# Patient Record
Sex: Female | Born: 1935 | Race: White | Hispanic: No | Smoking: Never smoker
Health system: Southern US, Community
[De-identification: ages and names within clinical notes are randomized; demographics above are authoritative.]

## PROBLEM LIST (undated history)

## (undated) DIAGNOSIS — E7800 Pure hypercholesterolemia, unspecified: Secondary | ICD-10-CM

## (undated) DIAGNOSIS — M8580 Other specified disorders of bone density and structure, unspecified site: Secondary | ICD-10-CM

## (undated) DIAGNOSIS — I10 Essential (primary) hypertension: Secondary | ICD-10-CM

## (undated) DIAGNOSIS — F0390 Unspecified dementia without behavioral disturbance: Secondary | ICD-10-CM

## (undated) DIAGNOSIS — N189 Chronic kidney disease, unspecified: Secondary | ICD-10-CM

## (undated) DIAGNOSIS — F419 Anxiety disorder, unspecified: Secondary | ICD-10-CM

## (undated) DIAGNOSIS — R0600 Dyspnea, unspecified: Secondary | ICD-10-CM

## (undated) DIAGNOSIS — Z9989 Dependence on other enabling machines and devices: Secondary | ICD-10-CM

## (undated) HISTORY — PX: COLONOSCOPY: SHX174

## (undated) HISTORY — PX: EYE SURGERY: SHX253

## (undated) HISTORY — PX: ABDOMINAL HYSTERECTOMY: SHX81

---

## 1999-01-02 ENCOUNTER — Encounter: Payer: Self-pay | Admitting: Gynecology

## 1999-01-02 ENCOUNTER — Encounter: Admission: RE | Admit: 1999-01-02 | Discharge: 1999-01-02 | Payer: Self-pay | Admitting: Gynecology

## 1999-02-03 ENCOUNTER — Other Ambulatory Visit: Admission: RE | Admit: 1999-02-03 | Discharge: 1999-02-03 | Payer: Self-pay | Admitting: Gynecology

## 1999-12-02 ENCOUNTER — Ambulatory Visit (HOSPITAL_COMMUNITY): Admission: RE | Admit: 1999-12-02 | Discharge: 1999-12-02 | Payer: Self-pay | Admitting: Gastroenterology

## 2000-01-28 ENCOUNTER — Encounter: Payer: Self-pay | Admitting: Gynecology

## 2000-01-28 ENCOUNTER — Encounter: Admission: RE | Admit: 2000-01-28 | Discharge: 2000-01-28 | Payer: Self-pay | Admitting: Gynecology

## 2001-02-01 ENCOUNTER — Encounter: Payer: Self-pay | Admitting: Gynecology

## 2001-02-01 ENCOUNTER — Encounter: Admission: RE | Admit: 2001-02-01 | Discharge: 2001-02-01 | Payer: Self-pay | Admitting: Gynecology

## 2002-02-28 ENCOUNTER — Other Ambulatory Visit: Admission: RE | Admit: 2002-02-28 | Discharge: 2002-02-28 | Payer: Self-pay | Admitting: Gynecology

## 2003-03-04 ENCOUNTER — Emergency Department (HOSPITAL_COMMUNITY): Admission: EM | Admit: 2003-03-04 | Discharge: 2003-03-05 | Payer: Self-pay | Admitting: Emergency Medicine

## 2004-06-19 ENCOUNTER — Encounter: Admission: RE | Admit: 2004-06-19 | Discharge: 2004-06-19 | Payer: Self-pay | Admitting: Family Medicine

## 2005-07-31 ENCOUNTER — Encounter: Admission: RE | Admit: 2005-07-31 | Discharge: 2005-07-31 | Payer: Self-pay | Admitting: Family Medicine

## 2008-01-22 ENCOUNTER — Ambulatory Visit: Payer: Self-pay | Admitting: Diagnostic Radiology

## 2008-01-22 ENCOUNTER — Emergency Department (HOSPITAL_BASED_OUTPATIENT_CLINIC_OR_DEPARTMENT_OTHER): Admission: EM | Admit: 2008-01-22 | Discharge: 2008-01-22 | Payer: Self-pay | Admitting: Emergency Medicine

## 2010-06-06 NOTE — Procedures (Signed)
[facility]  Patient:    Julie Martinez, Julie Martinez                     MRN: [id number] Proc. Date: 12/02/99 Adm. Date:  [id number] Attending:  Orland Mustard CC:         Dellis Anes. Idell Pickles, M.D.   Procedure Report  PROCEDURE PERFORMED:  Colonoscopy.  ENDOSCOPIST:  Llana Aliment. Randa Evens, M.D.  MEDICATIONS USED:  Fentanyl 50 mcg, Versed 8 mg IV.  INSTRUMENT USED:  Pediatric video colonoscope.  INDICATIONS:  Abnormal CT with a narrowing in the sigmoid colon.  The patient has had left lower abdominal pain.  DESCRIPTION OF PROCEDURE:  The procedure had been explained to the patient and consent obtained.  With the patient in the left lateral decubitus position, the Olympus pediatric video colonoscope was inserted and advanced under direct visualization.  The prep was excellent and we were able to advance to the cecum without a great deal of difficulty.  The patient did have some diverticular disease of the sigmoid colon but it was not inflamed, swollen, narrowed or strictured.  We were able to pass this easily.  The ileocecal valve was identified as was the appendiceal orifice.  The scope was withdrawn and the cecum, ascending colon, hepatic flexure, transverse colon, splenic flexure, descending and sigmoid colon were seen well upon withdrawal.  No polyps or other lesions were seen.  Again moderate diverticular disease in the sigmoid colon without any evidence of diverticulitis, stricture or tumor.  No polyps were seen throughout the entire colon.  The rectum was also free of any significant lesions.  Scope withdrawn, patient tolerated the procedure well.  ASSESSMENT: 1. Moderate diverticular disease without any evidence of narrowing,    stricturing or active diverticulitis.  PLAN:  Will give the patient information about diverticulosis, start her on fiber supplements and see back in the office in four to six weeks. DD:  12/02/99 TD:  12/02/99 Job:  [id number]

## 2014-02-17 DIAGNOSIS — Z1231 Encounter for screening mammogram for malignant neoplasm of breast: Secondary | ICD-10-CM | POA: Diagnosis not present

## 2014-02-19 DIAGNOSIS — E559 Vitamin D deficiency, unspecified: Secondary | ICD-10-CM | POA: Diagnosis not present

## 2014-02-19 DIAGNOSIS — H578 Other specified disorders of eye and adnexa: Secondary | ICD-10-CM | POA: Diagnosis not present

## 2014-02-19 DIAGNOSIS — E782 Mixed hyperlipidemia: Secondary | ICD-10-CM | POA: Diagnosis not present

## 2014-02-19 DIAGNOSIS — L989 Disorder of the skin and subcutaneous tissue, unspecified: Secondary | ICD-10-CM | POA: Diagnosis not present

## 2014-02-19 DIAGNOSIS — F419 Anxiety disorder, unspecified: Secondary | ICD-10-CM | POA: Diagnosis not present

## 2014-02-19 DIAGNOSIS — I10 Essential (primary) hypertension: Secondary | ICD-10-CM | POA: Diagnosis not present

## 2014-02-19 DIAGNOSIS — R5383 Other fatigue: Secondary | ICD-10-CM | POA: Diagnosis not present

## 2014-02-19 DIAGNOSIS — Z79899 Other long term (current) drug therapy: Secondary | ICD-10-CM | POA: Diagnosis not present

## 2014-02-20 DIAGNOSIS — H1012 Acute atopic conjunctivitis, left eye: Secondary | ICD-10-CM | POA: Diagnosis not present

## 2014-02-27 DIAGNOSIS — H1012 Acute atopic conjunctivitis, left eye: Secondary | ICD-10-CM | POA: Diagnosis not present

## 2014-03-06 DIAGNOSIS — L309 Dermatitis, unspecified: Secondary | ICD-10-CM | POA: Diagnosis not present

## 2014-03-06 DIAGNOSIS — I781 Nevus, non-neoplastic: Secondary | ICD-10-CM | POA: Diagnosis not present

## 2014-03-06 DIAGNOSIS — X32XXXD Exposure to sunlight, subsequent encounter: Secondary | ICD-10-CM | POA: Diagnosis not present

## 2014-03-06 DIAGNOSIS — C44622 Squamous cell carcinoma of skin of right upper limb, including shoulder: Secondary | ICD-10-CM | POA: Diagnosis not present

## 2014-03-06 DIAGNOSIS — L570 Actinic keratosis: Secondary | ICD-10-CM | POA: Diagnosis not present

## 2014-03-06 DIAGNOSIS — L821 Other seborrheic keratosis: Secondary | ICD-10-CM | POA: Diagnosis not present

## 2014-04-03 DIAGNOSIS — D0439 Carcinoma in situ of skin of other parts of face: Secondary | ICD-10-CM | POA: Diagnosis not present

## 2014-04-03 DIAGNOSIS — Z85828 Personal history of other malignant neoplasm of skin: Secondary | ICD-10-CM | POA: Diagnosis not present

## 2014-04-03 DIAGNOSIS — L570 Actinic keratosis: Secondary | ICD-10-CM | POA: Diagnosis not present

## 2014-04-03 DIAGNOSIS — Z08 Encounter for follow-up examination after completed treatment for malignant neoplasm: Secondary | ICD-10-CM | POA: Diagnosis not present

## 2014-04-03 DIAGNOSIS — X32XXXD Exposure to sunlight, subsequent encounter: Secondary | ICD-10-CM | POA: Diagnosis not present

## 2014-05-01 DIAGNOSIS — X32XXXD Exposure to sunlight, subsequent encounter: Secondary | ICD-10-CM | POA: Diagnosis not present

## 2014-05-01 DIAGNOSIS — Z85828 Personal history of other malignant neoplasm of skin: Secondary | ICD-10-CM | POA: Diagnosis not present

## 2014-05-01 DIAGNOSIS — L570 Actinic keratosis: Secondary | ICD-10-CM | POA: Diagnosis not present

## 2014-05-01 DIAGNOSIS — Z08 Encounter for follow-up examination after completed treatment for malignant neoplasm: Secondary | ICD-10-CM | POA: Diagnosis not present

## 2014-07-30 DIAGNOSIS — R42 Dizziness and giddiness: Secondary | ICD-10-CM | POA: Diagnosis not present

## 2014-07-30 DIAGNOSIS — S61204A Unspecified open wound of right ring finger without damage to nail, initial encounter: Secondary | ICD-10-CM | POA: Diagnosis not present

## 2014-07-30 DIAGNOSIS — Z79899 Other long term (current) drug therapy: Secondary | ICD-10-CM | POA: Diagnosis not present

## 2014-07-30 DIAGNOSIS — Z85828 Personal history of other malignant neoplasm of skin: Secondary | ICD-10-CM | POA: Diagnosis not present

## 2014-07-30 DIAGNOSIS — E780 Pure hypercholesterolemia: Secondary | ICD-10-CM | POA: Diagnosis not present

## 2014-07-30 DIAGNOSIS — Z7982 Long term (current) use of aspirin: Secondary | ICD-10-CM | POA: Diagnosis not present

## 2014-07-30 DIAGNOSIS — S61214A Laceration without foreign body of right ring finger without damage to nail, initial encounter: Secondary | ICD-10-CM | POA: Diagnosis not present

## 2014-07-30 DIAGNOSIS — I10 Essential (primary) hypertension: Secondary | ICD-10-CM | POA: Diagnosis not present

## 2014-07-30 DIAGNOSIS — S0180XA Unspecified open wound of other part of head, initial encounter: Secondary | ICD-10-CM | POA: Diagnosis not present

## 2014-07-30 DIAGNOSIS — W0110XA Fall on same level from slipping, tripping and stumbling with subsequent striking against unspecified object, initial encounter: Secondary | ICD-10-CM | POA: Diagnosis not present

## 2014-11-12 ENCOUNTER — Encounter (HOSPITAL_BASED_OUTPATIENT_CLINIC_OR_DEPARTMENT_OTHER): Payer: Self-pay | Admitting: *Deleted

## 2014-11-12 ENCOUNTER — Emergency Department (HOSPITAL_BASED_OUTPATIENT_CLINIC_OR_DEPARTMENT_OTHER): Payer: Commercial Managed Care - HMO

## 2014-11-12 ENCOUNTER — Emergency Department (HOSPITAL_BASED_OUTPATIENT_CLINIC_OR_DEPARTMENT_OTHER)
Admission: EM | Admit: 2014-11-12 | Discharge: 2014-11-12 | Disposition: A | Discharge status: Home / Self Care | Payer: Commercial Managed Care - HMO | Attending: Emergency Medicine | Admitting: Emergency Medicine

## 2014-11-12 ENCOUNTER — Emergency Department (HOSPITAL_COMMUNITY): Payer: Commercial Managed Care - HMO

## 2014-11-12 DIAGNOSIS — E782 Mixed hyperlipidemia: Secondary | ICD-10-CM | POA: Diagnosis not present

## 2014-11-12 DIAGNOSIS — R413 Other amnesia: Secondary | ICD-10-CM | POA: Diagnosis not present

## 2014-11-12 DIAGNOSIS — M8580 Other specified disorders of bone density and structure, unspecified site: Secondary | ICD-10-CM | POA: Diagnosis not present

## 2014-11-12 DIAGNOSIS — R2681 Unsteadiness on feet: Secondary | ICD-10-CM | POA: Diagnosis not present

## 2014-11-12 DIAGNOSIS — I10 Essential (primary) hypertension: Secondary | ICD-10-CM | POA: Insufficient documentation

## 2014-11-12 DIAGNOSIS — R270 Ataxia, unspecified: Secondary | ICD-10-CM

## 2014-11-12 DIAGNOSIS — R42 Dizziness and giddiness: Secondary | ICD-10-CM

## 2014-11-12 DIAGNOSIS — E785 Hyperlipidemia, unspecified: Secondary | ICD-10-CM | POA: Diagnosis not present

## 2014-11-12 DIAGNOSIS — F4320 Adjustment disorder, unspecified: Secondary | ICD-10-CM | POA: Diagnosis not present

## 2014-11-12 DIAGNOSIS — E559 Vitamin D deficiency, unspecified: Secondary | ICD-10-CM | POA: Diagnosis not present

## 2014-11-12 HISTORY — DX: Essential (primary) hypertension: I10

## 2014-11-12 HISTORY — DX: Other specified disorders of bone density and structure, unspecified site: M85.80

## 2014-11-12 HISTORY — DX: Pure hypercholesterolemia, unspecified: E78.00

## 2014-11-12 LAB — BASIC METABOLIC PANEL
ANION GAP: 4 — AB (ref 5–15)
BUN: 22 mg/dL — ABNORMAL HIGH (ref 6–20)
CALCIUM: 9 mg/dL (ref 8.9–10.3)
CO2: 28 mmol/L (ref 22–32)
CREATININE: 0.75 mg/dL (ref 0.44–1.00)
Chloride: 106 mmol/L (ref 101–111)
GLUCOSE: 103 mg/dL — AB (ref 65–99)
Potassium: 3.9 mmol/L (ref 3.5–5.1)
Sodium: 138 mmol/L (ref 135–145)

## 2014-11-12 LAB — CBC WITH DIFFERENTIAL/PLATELET
BASOS ABS: 0 10*3/uL (ref 0.0–0.1)
BASOS PCT: 0 %
EOS PCT: 2 %
Eosinophils Absolute: 0.1 10*3/uL (ref 0.0–0.7)
HCT: 40.6 % (ref 36.0–46.0)
Hemoglobin: 13.3 g/dL (ref 12.0–15.0)
Lymphocytes Relative: 17 %
Lymphs Abs: 1.1 10*3/uL (ref 0.7–4.0)
MCH: 32.8 pg (ref 26.0–34.0)
MCHC: 32.8 g/dL (ref 30.0–36.0)
MCV: 100.2 fL — AB (ref 78.0–100.0)
MONO ABS: 0.6 10*3/uL (ref 0.1–1.0)
MONOS PCT: 9 %
Neutro Abs: 4.8 10*3/uL (ref 1.7–7.7)
Neutrophils Relative %: 72 %
PLATELETS: 215 10*3/uL (ref 150–400)
RBC: 4.05 MIL/uL (ref 3.87–5.11)
RDW: 12.4 % (ref 11.5–15.5)
WBC: 6.7 10*3/uL (ref 4.0–10.5)

## 2014-11-12 LAB — URINE MICROSCOPIC-ADD ON

## 2014-11-12 LAB — URINALYSIS, ROUTINE W REFLEX MICROSCOPIC
BILIRUBIN URINE: NEGATIVE
Glucose, UA: NEGATIVE mg/dL
HGB URINE DIPSTICK: NEGATIVE
KETONES UR: 15 mg/dL — AB
NITRITE: NEGATIVE
PH: 7 (ref 5.0–8.0)
Protein, ur: NEGATIVE mg/dL
SPECIFIC GRAVITY, URINE: 1.023 (ref 1.005–1.030)
Urobilinogen, UA: 1 mg/dL (ref 0.0–1.0)

## 2014-11-12 MED ORDER — MECLIZINE HCL 25 MG PO TABS
25.0000 mg | ORAL_TABLET | Freq: Once | ORAL | Status: AC
  Administered 2014-11-12: 25 mg via ORAL
  Filled 2014-11-12: qty 1

## 2014-11-12 NOTE — ED Provider Notes (Signed)
Pt is dizzy. Sent over from [facility] for MRI to r/o stroke. MRI is neg. I ambulated pt, and she had a guarded but stable gait with her own power. Offered Case Management and home PT and walker and bedside comod, but family refused, opting to take home their mother and staying with her all the times and seeing the pcp this week. Strict return precautions discussed.   Varney Biles, MD 11/12/14 2101

## 2014-11-12 NOTE — ED Provider Notes (Signed)
Medical screening examination/treatment/procedure(s) were conducted as a shared visit with non-physician practitioner(s) and myself.  I personally evaluated the patient during the encounter.  81 year old female with hypertension hyperlipidemia that presents to the emergency department with vertigo of unknown onset within the last 24 hours. Also with difficulty walking and running into the walls while consistent with leaning to the right per the family members. On my examination patient has no dysdiadochokinesia, dysmetria, motor or sensory abnormalities. However on ambulation patient is unable lift her left foot as well and this is apparently new. She also has a wide-based gait and walks slowly which is not her normal as well. Has had a little bit of increased urinary frequency so we'll check a urine here. Also is having vertigo so we will give her dose of meclizine to see if this helps. I'm concerned the patient possibly could have a central cause such as stroke so I discussed it with Dr. Doy Mince on the neurology team at [facility] who recommended transfer to the emergency department for an MRI and further vertigo workup as necessary and neurology consultation as appropriate.   Merrily Pew, MD 11/12/14 [phone]

## 2014-11-12 NOTE — ED Notes (Signed)
Patient transported to MRI 

## 2014-11-12 NOTE — ED Notes (Addendum)
Woke with dizziness. She was sent from [facility] for a CT of her head to r/o stroke.

## 2014-11-12 NOTE — ED Notes (Signed)
[facility] at bedside for transport. 

## 2014-11-12 NOTE — ED Notes (Signed)
MD and PA at bedside.  Pt currently ambulating with tech without difficulty.

## 2014-11-12 NOTE — ED Notes (Signed)
EDP asked to give pt and family update

## 2014-11-12 NOTE — ED Notes (Signed)
Dr. Kathrynn Humble ambulated pt in the hallway. Pt appeared steady on her feet. Junie Panning - RN aware of pt being ambulated.

## 2014-11-12 NOTE — Care Management (Signed)
ED CM met with patient and daughter in room to discuss recommendations for [facility] services, patient and daughter declined, stating they are having new floors laid, offered of OP services declined as well. Patient will have support at home 24 hours according to daughter Wells Guiles [phone]. Patient has rolling walker and bedside commode at home. Updated Dr. Dan Europe.and Industrial/product designer on [employer]. No  Further ED CM needs identified.

## 2014-11-12 NOTE — Discharge Instructions (Signed)
Ataxia °Ataxia is a condition that results in unsteadiness when walking and standing, poor coordination of body movements, and difficulty maintaining an upright posture. It occurs due to a problem with the part of your brain that controls coordination and stability (cerebellar dysfunction).  °CAUSES  °Ataxia can develop later in life (acquired ataxia) during your 20s to 30s, and even as late as into your 60s or beyond. Acquired ataxia may be caused by: °· Changes in your nervous system (neurodegenerative). °· Changes throughout your body (systemic disorders). °· Excess exposure to: °¨ Medicines, such as phenytoin and lithium. °¨ Solvents. °¨ Abuse of alcohol (alcoholism). °· Medical conditions, such as: °¨ Celiac sprue. °¨ Hypothyroidism. °¨ Vitamin E deficiency. °¨ Structural brain abnormalities, such as tumors. °¨ Multiple sclerosis. °¨ Stroke. °¨ Head injury. °Ataxia may also be present early in life (non-acquired ataxia). There are two main types of non-acquired ataxia: °· Cerebellar dysfunction present at birth (congenital). °· Family inheritance (genetic heredity). Friedreich ataxia is the most common form of hereditary ataxia. °SIGNS AND SYMPTOMS °The signs and symptoms of ataxia can vary depending on how severe the condition is that causes it. Signs and symptoms may include: °· Unsteadiness. °· Walking with a wide stance. °· Tremor. °· Poorly coordinated body movements. °· Difficulty maintaining a straight (upright) posture. °· Fatigue. °· Changes in your speech. °· Changes in your vision. °· Difficulty swallowing. °· Difficulty with writing. °· Decreased mental status (dementia). °· Muscle spasms. °DIAGNOSIS  °Ataxia is diagnosed by discussing your personal and family history and through a physical exam. You may also have additional tests such as: °· MRI. °· Genetic testing. °TREATMENT  °Treatment for ataxia may include treating or removing the underlying condition causing the ataxia. Surgery may be  required if a structural abnormality in your brain is causing the ataxia. Otherwise, supportive treatments may be used to manage your symptoms. °HOME CARE INSTRUCTIONS °Monitor your ataxia for any changes. The following actions may help any discomfort you are experiencing:  °· Do not drink alcohol. °· Lie down right away if you become very unsteady, dizzy, nauseated, or feel like you are going to faint. Wait until all of these feelings pass before you get up again. °SEEK IMMEDIATE MEDICAL CARE IF: °· Your unsteadiness suddenly worsens. °· You develop severe headaches, chest pain, or abdominal pain.   °· You have weakness or numbness on one side of your body.   °· You have problems with your vision.   °· You feel confused.   °· You have difficulty speaking.   °· You have an irregular heartbeat or a very fast pulse.   °  °This information is not intended to replace advice given to you by your health care provider. Make sure you discuss any questions you have with your health care provider. °  °Document Released: 08/02/2013 Document Reviewed: 08/02/2013 °Elsevier Interactive Patient Education ©2016 [employer] Inc. ° °

## 2014-11-12 NOTE — ED Notes (Signed)
[facility] transporting 

## 2014-11-12 NOTE — ED Notes (Signed)
Pt ambulatory to restroom standby assist with mild deviation to R, no assist needed for ambulation.

## 2014-11-12 NOTE — ED Notes (Signed)
Pt returned from MRI in no apparent distress

## 2014-11-12 NOTE — ED Provider Notes (Signed)
CSN: [id number]     Arrival date & time 11/12/14  1225 History   First MD Initiated Contact with Patient 11/12/14 1254     Chief Complaint  Patient presents with  . Dizziness    HPI   81 yo F PMH HTN, hyperlipidemia pw dizziness since she awoke this morning. She went to her PCP for evaluation of the dizziness, who told her to come to the ED for stroke workup. She has been concerned because her dizziness has been influencing her balance/gait. She describes the dizziness as constant, and "not feeling well." Her family members feel she has been more confused recently. She endorses urinary frequency and urgency. No room spinning sensation, fever, chills, CP, abdominal pain, N/V/D, recent change in medications or antibiotics.   Past Medical History  Diagnosis Date  . Hypertension   . Osteopenia   . High cholesterol    Past Surgical History  Procedure Laterality Date  . Abdominal hysterectomy    . Eye surgery     No family history on file. Social History  Substance Use Topics  . Smoking status: Never Smoker   . Smokeless tobacco: None  . Alcohol Use: No   OB History    No data available     Review of Systems  HENT: Negative for congestion, dental problem, drooling, ear discharge, ear pain, facial swelling, hearing loss, mouth sores, nosebleeds, postnasal drip, rhinorrhea, sinus pressure, sneezing, sore throat, tinnitus and trouble swallowing.   Eyes: Negative.   Respiratory: Negative.   Cardiovascular: Negative.   Gastrointestinal: Negative.   Genitourinary: Positive for urgency and frequency. Negative for dysuria, hematuria, flank pain, decreased urine volume, vaginal bleeding, vaginal discharge, enuresis, difficulty urinating, genital sores, vaginal pain, menstrual problem, pelvic pain and dyspareunia.  Musculoskeletal: Positive for gait problem. Negative for myalgias, back pain, joint swelling, arthralgias, neck pain and neck stiffness.  Skin: Negative.   Neurological:  Positive for dizziness and weakness. Negative for tremors, seizures, syncope, facial asymmetry, speech difficulty, light-headedness, numbness and headaches.  Hematological: Negative.   Psychiatric/Behavioral: Positive for confusion.       Family members state she is confused (occuring for at least 6 months).      Allergies  Review of patient's allergies indicates no known allergies.  Home Medications   Prior to Admission medications   Medication Sig Start Date End Date Taking? Authorizing Provider  alendronate (FOSAMAX) 70 MG tablet Take 70 mg by mouth once a week. Take with a full glass of water on an empty stomach.   Yes Historical Provider, MD  Aspirin (ASPIR-81 PO) Take by mouth.   Yes Historical Provider, MD  quinapril (ACCUPRIL) 20 MG tablet Take 20 mg by mouth at bedtime.   Yes Historical Provider, MD  SERTRALINE HCL PO Take by mouth.   Yes Historical Provider, MD  triamterene-hydrochlorothiazide (MAXZIDE-25) 37.5-25 MG tablet Take 1 tablet by mouth daily.   Yes Historical Provider, MD   BP 171/60 mmHg  Pulse 70  Temp(Src) 97.9 F (36.6 C) (Oral)  Resp 20  Ht 5\' 2"  (1.575 m)  Wt 113 lb (51.256 kg)  BMI 20.66 kg/m2  SpO2 97% Physical Exam  Constitutional: She is oriented to person, place, and time. She appears well-developed and well-nourished. No distress.  HENT:  Head: Normocephalic and atraumatic.  Left Ear: External ear normal.  Nose: Nose normal.  Mouth/Throat: Oropharynx is clear and moist. No oropharyngeal exudate.  Impacted cerumen.   Eyes: Conjunctivae are normal. Pupils are equal, round, and reactive to  light. Right eye exhibits no discharge. Left eye exhibits no discharge. No scleral icterus.  Ectropion bilaterally  Neck: No tracheal deviation present.  Cardiovascular: Normal rate, regular rhythm, normal heart sounds and intact distal pulses.  Exam reveals no gallop and no friction rub.   No murmur heard. Pulmonary/Chest: Effort normal and breath sounds  normal. No respiratory distress. She has no wheezes. She has no rales. She exhibits no tenderness.  Abdominal: Soft. Bowel sounds are normal. She exhibits no distension and no mass. There is no tenderness. There is no rebound and no guarding.  Musculoskeletal: Normal range of motion. She exhibits no edema.  Lymphadenopathy:    She has no cervical adenopathy.  Neurological: She is alert and oriented to person, place, and time. No cranial nerve deficit. She exhibits normal muscle tone. Coordination normal.  Wide-based stance and difficulty lifting left foot during gait examination. Family states this is new.   Skin: Skin is warm and dry. No rash noted. She is not diaphoretic. No erythema.  Psychiatric: She has a normal mood and affect. Her behavior is normal.  Nursing note and vitals reviewed.   ED Course  Procedures (including critical care time) Labs Review Labs Reviewed  CBC WITH DIFFERENTIAL/PLATELET - Abnormal; Notable for the following:    MCV 100.2 (*)    All other components within normal limits  BASIC METABOLIC PANEL - Abnormal; Notable for the following:    Glucose, Bld 103 (*)    BUN 22 (*)    Anion gap 4 (*)    All other components within normal limits  URINALYSIS, ROUTINE W REFLEX MICROSCOPIC (NOT AT [facility]) - Abnormal; Notable for the following:    Ketones, ur 15 (*)    Leukocytes, UA SMALL (*)    All other components within normal limits  URINE MICROSCOPIC-ADD ON - Abnormal; Notable for the following:    Bacteria, UA FEW (*)    All other components within normal limits    Imaging Review  CT Head Wo Contrast (Final result) Result time: 11/12/14 13:51:28  Final result by Rad Results In Interface (11/12/14 13:51:28)  Narrative:  CLINICAL DATA: Dizziness for 1 day  EXAM: CT HEAD WITHOUT CONTRAST  TECHNIQUE: Contiguous axial images were obtained from the base of the skull through the vertex without intravenous contrast.  COMPARISON: January 22, 2008  FINDINGS: There is age related volume loss. There is no intracranial mass, hemorrhage, extra-axial fluid collection, or midline shift. There is mild small vessel disease in the centra semiovale bilaterally. Elsewhere, gray-white compartments appear normal. No acute infarct is evident. Bony calvarium appears intact. The mastoid air cells are clear.  IMPRESSION: Age related volume loss with mild periventricular small vessel disease. No intracranial mass, hemorrhage, or acute appearing infarct.   Electronically Signed By: Lowella Grip III M.D. On: 11/12/2014 13:51  I have personally reviewed and evaluated these images and lab results as part of my medical decision-making.  MDM   Final diagnoses:  Vertigo  Ataxia   Patient afebrile with stable vitals. Will order CT head, EKG, CBC, BMP, UA.  Patient given meclizine.   Pt neurovascularly intact; however, when ambulating, demonstrates wide based stance and veers to the right. [facility] does not have a MRI scanner, so Dr. Dolly Rias spoke with Dr. Doy Mince and Dr. Vanita Panda who approved transfer to [facility] for further ataxia and vertigo workup.    Clare Lions, PA-C 11/17/14 1818  Merrily Pew, MD 11/17/14 [phone]

## 2014-11-15 DIAGNOSIS — F339 Major depressive disorder, recurrent, unspecified: Secondary | ICD-10-CM | POA: Diagnosis not present

## 2014-11-15 DIAGNOSIS — R42 Dizziness and giddiness: Secondary | ICD-10-CM | POA: Diagnosis not present

## 2014-11-15 DIAGNOSIS — Z23 Encounter for immunization: Secondary | ICD-10-CM | POA: Diagnosis not present

## 2015-01-04 DIAGNOSIS — F419 Anxiety disorder, unspecified: Secondary | ICD-10-CM | POA: Diagnosis not present

## 2015-01-04 DIAGNOSIS — I10 Essential (primary) hypertension: Secondary | ICD-10-CM | POA: Diagnosis not present

## 2015-01-04 DIAGNOSIS — E782 Mixed hyperlipidemia: Secondary | ICD-10-CM | POA: Diagnosis not present

## 2015-01-04 DIAGNOSIS — E559 Vitamin D deficiency, unspecified: Secondary | ICD-10-CM | POA: Diagnosis not present

## 2015-01-04 DIAGNOSIS — F339 Major depressive disorder, recurrent, unspecified: Secondary | ICD-10-CM | POA: Diagnosis not present

## 2015-01-04 DIAGNOSIS — R42 Dizziness and giddiness: Secondary | ICD-10-CM | POA: Diagnosis not present

## 2015-01-04 DIAGNOSIS — R413 Other amnesia: Secondary | ICD-10-CM | POA: Diagnosis not present

## 2015-01-20 HISTORY — PX: SHOULDER SURGERY: SHX246

## 2015-01-25 ENCOUNTER — Ambulatory Visit (INDEPENDENT_AMBULATORY_CARE_PROVIDER_SITE_OTHER): Payer: Commercial Managed Care - HMO | Admitting: Neurology

## 2015-01-25 ENCOUNTER — Encounter: Payer: Self-pay | Admitting: Neurology

## 2015-01-25 VITALS — BP 165/67 | HR 84 | Ht 62.0 in | Wt 111.6 lb

## 2015-01-25 DIAGNOSIS — E538 Deficiency of other specified B group vitamins: Secondary | ICD-10-CM | POA: Diagnosis not present

## 2015-01-25 DIAGNOSIS — F32A Depression, unspecified: Secondary | ICD-10-CM

## 2015-01-25 DIAGNOSIS — R413 Other amnesia: Secondary | ICD-10-CM

## 2015-01-25 DIAGNOSIS — F329 Major depressive disorder, single episode, unspecified: Secondary | ICD-10-CM | POA: Diagnosis not present

## 2015-01-25 MED ORDER — DONEPEZIL HCL 10 MG PO TABS: 10.0000 mg | ORAL_TABLET | Freq: Every day | ORAL | Status: DC

## 2015-01-25 NOTE — Progress Notes (Signed)
[facility]    Provider:  Dr Jaynee Eagles Referring Provider: Leighton Ruff, MD Primary Care Physician:  Gerrit Heck, MD  CC:  Memory problems  HPI:  NAZARENE Martinez is a 82 y.o. female here as a referral from Dr. Drema Dallas for memory problems. Past medical history of hypertension, anxiety and depression. Here with daughter who provides most information. Memory problems at least the last year. Has been worse since 07-16-2022 when her son passed away from [place]. Daughter noticed she forgot a funeral she went to of daughter's brother in law in November. Son was living his mother for many years since the late nighties. They noticed more memory problems since he died. Husband passed away in 1997/98. She has been depressed since her son died. She says his dog died recently (daughter says the dog died many years ago). Patient pays the bills and she has paid all of them. No missing bills, she owns the house, all the utilities are paid. Daughter reminds mother and takes her to all appointments. Daughter goes over every day. No accidents in the home. Patient is alone a lot and she is depressed. atient repeats herself a lot, that has been going on for the last year. She doesn't remember telling daughter many things. Brother with dementia.    Reviewed notes, labs and imaging from outside physicians, which showed:   Reviewed notes from [facility]. Patient has had problems with memory, the Mini-Mental Status exam done on 1024 had a score of 25 out of 30. Patient has also had a number of losses recently her son died of ALS, her dog died and recently a son-in-law die. Patient had forgotten that the son-in-law died. Patient has a history of hypertension, uncertain if she is actually taking. She was prescribed in September 90 day prescription. She has not been checking her blood pressures at home. Diagnosed with memory problems and essential hypertension, mixed hyperlipidemia, anxiety and  depression, dizziness. She was seen at the emergency room for dizziness and she was giving meclizine, the dizziness improved. 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 She is on sertraline and that dose was increased recently. She had reported she wasn't sleeping well. Notes noted that she had a sad mood and affect.  Personally reviewed images of the brain, MRI w/o 10/2013:  1. No acute intracranial abnormality. 2. 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  Review of Systems: Patient complains of symptoms per HPI as well as the following symptoms: Weight loss, blurred vision, easy bruising, cough, ringing in ears, cramps, rash, itching, runny nose, skin sensitivity, memory loss, dizziness, depression, decreased energy . Pertinent negatives per HPI. All others negative.   Social History   Social History  . Marital Status: Widowed    Spouse Name: N/A  . Number of Children: 3  . Years of Education: 1th   Occupational History  . n/a    Social History Main Topics  . Smoking status: Never Smoker   . Smokeless tobacco: Not on file  . Alcohol Use: No  . Drug Use: No  . Sexual Activity: Not on file   Other Topics Concern  . Not on file   Social History Narrative   Patient lives at home alone.   Caffeine Use: 1/2 cup of coffee daily    Family History  Problem Relation Age of Onset  . Heart failure  Mother     Past Medical History  Diagnosis Date  . Hypertension   . Osteopenia   . High cholesterol     Past Surgical History  Procedure Laterality Date  . Abdominal hysterectomy    . Eye surgery      Current Outpatient Prescriptions  Medication Sig Dispense Refill  . alendronate (FOSAMAX) 70 MG tablet Take 70 mg by mouth once a week. Take with a full glass of water on an empty stomach.    Marland Kitchen aspirin 81 MG EC tablet Take 81 mg by mouth  daily.    . quinapril (ACCUPRIL) 20 MG tablet Take 20 mg by mouth at bedtime.    . sertraline (ZOLOFT) 25 MG tablet Take 25 mg by mouth daily.    Marland Kitchen triamterene-hydrochlorothiazide (DYAZIDE) 37.5-25 MG capsule Take 1 capsule by mouth daily.  0   No current facility-administered medications for this visit.    Allergies as of 01/25/2015  . (No Known Allergies)    Vitals: BP 165/67 mmHg  Pulse 84  Ht 5\' 2"  (1.575 m)  Wt 111 lb 9.6 oz (50.621 kg)  BMI 20.41 kg/m2 Last Weight:  Wt Readings from Last 1 Encounters:  01/25/15 111 lb 9.6 oz (50.621 kg)   Last Height:   Ht Readings from Last 1 Encounters:  01/25/15 5\' 2"  (1.575 m)    Physical exam: Exam: Gen: NAD, conversant, well nourised, obese, well groomed                     CV: RRR, no MRG. No Carotid Bruits. No peripheral edema, warm, nontender Eyes: Conjunctivae clear without exudates or hemorrhage  Neuro: Detailed Neurologic Exam  Speech:    Speech is normal; fluent and spontaneous with normal comprehension.  Cognition:    The patient is oriented to person, place, and time;  Montreal Cognitive Assessment  01/25/2015  Visuospatial/ Executive (0/5) 3  Naming (0/3) 3  Attention: Read list of digits (0/2) 2  Attention: Read list of letters (0/1) 1  Attention: Serial 7 subtraction starting at 100 (0/3) 1  Language: Repeat phrase (0/2) 1  Language : Fluency (0/1) 0  Abstraction (0/2) 1  Delayed Recall (0/5) 1  Orientation (0/6) 5  Total 18    Cranial Nerves:    The pupils are equal, round, and reactive to light. The fundi are normal and spontaneous venous pulsations are present. Visual fields are full to finger confrontation. Extraocular movements are intact. Trigeminal sensation is intact and the muscles of mastication are normal. The face is symmetric. The palate elevates in the midline. Hearing intact. Voice is normal. Shoulder shrug is normal. The tongue has normal motion without fasciculations.   Coordination:     Normal finger to nose and heel to shin.   Gait:    No ataxia  Motor Observation:    No asymmetry, no atrophy, and no involuntary movements noted. Tone:    Normal muscle tone.    Posture:    Posture is normal. normal erect    Strength:    Strength is V/V in the upper and lower limbs.      Sensation: intact to LT     Reflex Exam:  DTR's:    Deep tendon reflexes in the upper and lower extremities are normal bilaterally.   Toes:    The toes are downgoing bilaterally.   Clonus:    Clonus is absent.      Assessment/Plan:  An 82 year old female here for cognitive complaints  with her daughter. Montral cognitive assessment score 18 out of 30 consistent with moderate memory dysfunction. However patient has been depressed lately due to the death of her son from [place] whom she cared for for many years and lip with her. Discussed with patient and daughter that depression can worsen cognitive abilities and I did highly encourage them to continue seeing primary care and also find a therapist that she could speak to. I'll check a few lab tests such as B12 and start Aricept. We'll follow the patient with serial memory testing. Discussed formal neurocognitive testing, they decline.   Sarina Ill, MD  [facility] [address] [place], [place] [address]  Phone [phone] Fax [phone]

## 2015-01-25 NOTE — Patient Instructions (Addendum)
Overall you are doing fairly well but I do want to suggest a few things today:   Remember to drink plenty of fluid, eat healthy meals and do not skip any meals. Try to eat protein with a every meal and eat a healthy snack such as fruit or nuts in between meals. Try to keep a regular sleep-wake schedule and try to exercise daily, particularly in the form of walking, 20-30 minutes a day, if you can.   As far as your medications are concerned, I would like to suggest: Aricept 1/2 pill then increase to a whole pill in one month.  As far as diagnostic testing: labwork  I would like to see you back in 6 months, sooner if we need to. Please call us with any interim questions, concerns, problems, updates or refill requests.   Our phone number is [phone]. We also have an after hours call service for urgent matters and there is a physician on-call for urgent questions. For any emergencies you know to call 911 or go to the nearest emergency room

## 2015-01-28 DIAGNOSIS — R413 Other amnesia: Secondary | ICD-10-CM | POA: Insufficient documentation

## 2015-01-28 DIAGNOSIS — F32A Depression, unspecified: Secondary | ICD-10-CM | POA: Insufficient documentation

## 2015-01-28 DIAGNOSIS — F329 Major depressive disorder, single episode, unspecified: Secondary | ICD-10-CM | POA: Insufficient documentation

## 2015-01-30 ENCOUNTER — Telehealth: Payer: Self-pay | Admitting: *Deleted

## 2015-01-30 NOTE — Telephone Encounter (Signed)
Tried mobile number. VM not set up. Tried calling home number. Spoke to pt about normal labs per Dr Jaynee Eagles. She verbalized understanding.

## 2015-01-30 NOTE — Telephone Encounter (Signed)
-----   Message from Melvenia Beam, MD sent at 01/30/2015  4:59 PM EST ----- Labs were normal thanks

## 2015-01-31 DIAGNOSIS — R198 Other specified symptoms and signs involving the digestive system and abdomen: Secondary | ICD-10-CM | POA: Diagnosis not present

## 2015-01-31 LAB — THYROID PANEL WITH TSH
Free Thyroxine Index: 2.3 (ref 1.2–4.9)
T3 Uptake Ratio: 25 % (ref 24–39)
T4 TOTAL: 9.2 ug/dL (ref 4.5–12.0)
TSH: 1.2 u[IU]/mL (ref 0.450–4.500)

## 2015-01-31 LAB — B12 AND FOLATE PANEL
Folate: 14.1 ng/mL (ref >3.0)
VITAMIN B 12: 315 pg/mL (ref 211–946)

## 2015-01-31 LAB — METHYLMALONIC ACID, SERUM: Methylmalonic Acid: 528 nmol/L — ABNORMAL HIGH (ref 0–378)

## 2015-02-04 ENCOUNTER — Other Ambulatory Visit: Payer: Self-pay | Admitting: Family Medicine

## 2015-02-04 DIAGNOSIS — R198 Other specified symptoms and signs involving the digestive system and abdomen: Secondary | ICD-10-CM

## 2015-02-11 ENCOUNTER — Ambulatory Visit
Admission: RE | Admit: 2015-02-11 | Discharge: 2015-02-11 | Disposition: A | Discharge status: Home / Self Care | Payer: Commercial Managed Care - HMO | Source: Ambulatory Visit | Attending: Family Medicine | Admitting: Family Medicine

## 2015-02-11 DIAGNOSIS — R1900 Intra-abdominal and pelvic swelling, mass and lump, unspecified site: Secondary | ICD-10-CM | POA: Diagnosis not present

## 2015-02-11 DIAGNOSIS — R198 Other specified symptoms and signs involving the digestive system and abdomen: Secondary | ICD-10-CM

## 2015-03-18 DIAGNOSIS — S42292A Other displaced fracture of upper end of left humerus, initial encounter for closed fracture: Secondary | ICD-10-CM | POA: Diagnosis not present

## 2015-03-18 DIAGNOSIS — R079 Chest pain, unspecified: Secondary | ICD-10-CM | POA: Diagnosis not present

## 2015-03-18 DIAGNOSIS — E785 Hyperlipidemia, unspecified: Secondary | ICD-10-CM | POA: Diagnosis not present

## 2015-03-18 DIAGNOSIS — S42352D Displaced comminuted fracture of shaft of humerus, left arm, subsequent encounter for fracture with routine healing: Secondary | ICD-10-CM | POA: Diagnosis not present

## 2015-03-18 DIAGNOSIS — S42202A Unspecified fracture of upper end of left humerus, initial encounter for closed fracture: Secondary | ICD-10-CM | POA: Diagnosis not present

## 2015-03-18 DIAGNOSIS — R51 Headache: Secondary | ICD-10-CM | POA: Diagnosis not present

## 2015-03-18 DIAGNOSIS — S42292S Other displaced fracture of upper end of left humerus, sequela: Secondary | ICD-10-CM | POA: Diagnosis not present

## 2015-03-18 DIAGNOSIS — M25512 Pain in left shoulder: Secondary | ICD-10-CM | POA: Diagnosis not present

## 2015-03-18 DIAGNOSIS — S46222A Laceration of muscle, fascia and tendon of other parts of biceps, left arm, initial encounter: Secondary | ICD-10-CM | POA: Diagnosis not present

## 2015-03-18 DIAGNOSIS — S42392A Other fracture of shaft of left humerus, initial encounter for closed fracture: Secondary | ICD-10-CM | POA: Diagnosis not present

## 2015-03-18 DIAGNOSIS — S42302A Unspecified fracture of shaft of humerus, left arm, initial encounter for closed fracture: Secondary | ICD-10-CM | POA: Diagnosis not present

## 2015-03-18 DIAGNOSIS — W1789XA Other fall from one level to another, initial encounter: Secondary | ICD-10-CM | POA: Diagnosis not present

## 2015-03-18 DIAGNOSIS — S0003XA Contusion of scalp, initial encounter: Secondary | ICD-10-CM | POA: Diagnosis not present

## 2015-03-18 DIAGNOSIS — S42352S Displaced comminuted fracture of shaft of humerus, left arm, sequela: Secondary | ICD-10-CM | POA: Diagnosis not present

## 2015-03-18 DIAGNOSIS — M6281 Muscle weakness (generalized): Secondary | ICD-10-CM | POA: Diagnosis not present

## 2015-03-18 DIAGNOSIS — Z9889 Other specified postprocedural states: Secondary | ICD-10-CM | POA: Diagnosis not present

## 2015-03-18 DIAGNOSIS — Z7982 Long term (current) use of aspirin: Secondary | ICD-10-CM | POA: Diagnosis not present

## 2015-03-18 DIAGNOSIS — G8918 Other acute postprocedural pain: Secondary | ICD-10-CM | POA: Diagnosis not present

## 2015-03-18 DIAGNOSIS — S42352A Displaced comminuted fracture of shaft of humerus, left arm, initial encounter for closed fracture: Secondary | ICD-10-CM | POA: Diagnosis not present

## 2015-03-18 DIAGNOSIS — S46112A Strain of muscle, fascia and tendon of long head of biceps, left arm, initial encounter: Secondary | ICD-10-CM | POA: Diagnosis not present

## 2015-03-18 DIAGNOSIS — D72829 Elevated white blood cell count, unspecified: Secondary | ICD-10-CM | POA: Diagnosis not present

## 2015-03-18 DIAGNOSIS — Z79899 Other long term (current) drug therapy: Secondary | ICD-10-CM | POA: Diagnosis not present

## 2015-03-18 DIAGNOSIS — I10 Essential (primary) hypertension: Secondary | ICD-10-CM | POA: Diagnosis not present

## 2015-03-18 DIAGNOSIS — Z85828 Personal history of other malignant neoplasm of skin: Secondary | ICD-10-CM | POA: Diagnosis not present

## 2015-03-18 DIAGNOSIS — S161XXA Strain of muscle, fascia and tendon at neck level, initial encounter: Secondary | ICD-10-CM | POA: Diagnosis not present

## 2015-03-22 DIAGNOSIS — M6281 Muscle weakness (generalized): Secondary | ICD-10-CM | POA: Diagnosis not present

## 2015-03-22 DIAGNOSIS — E785 Hyperlipidemia, unspecified: Secondary | ICD-10-CM | POA: Diagnosis not present

## 2015-03-22 DIAGNOSIS — S42352S Displaced comminuted fracture of shaft of humerus, left arm, sequela: Secondary | ICD-10-CM | POA: Diagnosis not present

## 2015-03-22 DIAGNOSIS — M25512 Pain in left shoulder: Secondary | ICD-10-CM | POA: Diagnosis not present

## 2015-03-22 DIAGNOSIS — S42292S Other displaced fracture of upper end of left humerus, sequela: Secondary | ICD-10-CM | POA: Diagnosis not present

## 2015-03-22 DIAGNOSIS — I10 Essential (primary) hypertension: Secondary | ICD-10-CM | POA: Diagnosis not present

## 2015-03-22 DIAGNOSIS — M79622 Pain in left upper arm: Secondary | ICD-10-CM | POA: Diagnosis not present

## 2015-03-22 DIAGNOSIS — S42302A Unspecified fracture of shaft of humerus, left arm, initial encounter for closed fracture: Secondary | ICD-10-CM | POA: Diagnosis not present

## 2015-03-22 DIAGNOSIS — S42352D Displaced comminuted fracture of shaft of humerus, left arm, subsequent encounter for fracture with routine healing: Secondary | ICD-10-CM | POA: Diagnosis not present

## 2015-03-25 DIAGNOSIS — E785 Hyperlipidemia, unspecified: Secondary | ICD-10-CM | POA: Diagnosis not present

## 2015-03-25 DIAGNOSIS — S42352D Displaced comminuted fracture of shaft of humerus, left arm, subsequent encounter for fracture with routine healing: Secondary | ICD-10-CM | POA: Diagnosis not present

## 2015-03-25 DIAGNOSIS — I10 Essential (primary) hypertension: Secondary | ICD-10-CM | POA: Diagnosis not present

## 2015-04-01 DIAGNOSIS — M79622 Pain in left upper arm: Secondary | ICD-10-CM | POA: Diagnosis not present

## 2015-04-08 DIAGNOSIS — E785 Hyperlipidemia, unspecified: Secondary | ICD-10-CM | POA: Diagnosis not present

## 2015-04-08 DIAGNOSIS — I10 Essential (primary) hypertension: Secondary | ICD-10-CM | POA: Diagnosis not present

## 2015-04-08 DIAGNOSIS — S42352D Displaced comminuted fracture of shaft of humerus, left arm, subsequent encounter for fracture with routine healing: Secondary | ICD-10-CM | POA: Diagnosis not present

## 2015-04-18 DIAGNOSIS — E7800 Pure hypercholesterolemia, unspecified: Secondary | ICD-10-CM | POA: Diagnosis not present

## 2015-04-18 DIAGNOSIS — Z9181 History of falling: Secondary | ICD-10-CM | POA: Diagnosis not present

## 2015-04-18 DIAGNOSIS — M80022D Age-related osteoporosis with current pathological fracture, left humerus, subsequent encounter for fracture with routine healing: Secondary | ICD-10-CM | POA: Diagnosis not present

## 2015-04-18 DIAGNOSIS — I10 Essential (primary) hypertension: Secondary | ICD-10-CM | POA: Diagnosis not present

## 2015-04-22 DIAGNOSIS — Z8781 Personal history of (healed) traumatic fracture: Secondary | ICD-10-CM | POA: Diagnosis not present

## 2015-04-22 DIAGNOSIS — Z967 Presence of other bone and tendon implants: Secondary | ICD-10-CM | POA: Diagnosis not present

## 2015-04-24 DIAGNOSIS — I10 Essential (primary) hypertension: Secondary | ICD-10-CM | POA: Diagnosis not present

## 2015-04-24 DIAGNOSIS — E7800 Pure hypercholesterolemia, unspecified: Secondary | ICD-10-CM | POA: Diagnosis not present

## 2015-04-24 DIAGNOSIS — M80022D Age-related osteoporosis with current pathological fracture, left humerus, subsequent encounter for fracture with routine healing: Secondary | ICD-10-CM | POA: Diagnosis not present

## 2015-04-24 DIAGNOSIS — Z9181 History of falling: Secondary | ICD-10-CM | POA: Diagnosis not present

## 2015-04-26 DIAGNOSIS — M80022D Age-related osteoporosis with current pathological fracture, left humerus, subsequent encounter for fracture with routine healing: Secondary | ICD-10-CM | POA: Diagnosis not present

## 2015-04-26 DIAGNOSIS — I10 Essential (primary) hypertension: Secondary | ICD-10-CM | POA: Diagnosis not present

## 2015-04-26 DIAGNOSIS — E7800 Pure hypercholesterolemia, unspecified: Secondary | ICD-10-CM | POA: Diagnosis not present

## 2015-04-26 DIAGNOSIS — Z9181 History of falling: Secondary | ICD-10-CM | POA: Diagnosis not present

## 2015-05-08 DIAGNOSIS — I10 Essential (primary) hypertension: Secondary | ICD-10-CM | POA: Diagnosis not present

## 2015-05-08 DIAGNOSIS — E7800 Pure hypercholesterolemia, unspecified: Secondary | ICD-10-CM | POA: Diagnosis not present

## 2015-05-08 DIAGNOSIS — Z9181 History of falling: Secondary | ICD-10-CM | POA: Diagnosis not present

## 2015-05-08 DIAGNOSIS — M80022D Age-related osteoporosis with current pathological fracture, left humerus, subsequent encounter for fracture with routine healing: Secondary | ICD-10-CM | POA: Diagnosis not present

## 2015-05-10 DIAGNOSIS — I10 Essential (primary) hypertension: Secondary | ICD-10-CM | POA: Diagnosis not present

## 2015-05-10 DIAGNOSIS — M80022D Age-related osteoporosis with current pathological fracture, left humerus, subsequent encounter for fracture with routine healing: Secondary | ICD-10-CM | POA: Diagnosis not present

## 2015-05-10 DIAGNOSIS — Z9181 History of falling: Secondary | ICD-10-CM | POA: Diagnosis not present

## 2015-05-10 DIAGNOSIS — E7800 Pure hypercholesterolemia, unspecified: Secondary | ICD-10-CM | POA: Diagnosis not present

## 2015-05-13 DIAGNOSIS — M80022D Age-related osteoporosis with current pathological fracture, left humerus, subsequent encounter for fracture with routine healing: Secondary | ICD-10-CM | POA: Diagnosis not present

## 2015-05-13 DIAGNOSIS — I10 Essential (primary) hypertension: Secondary | ICD-10-CM | POA: Diagnosis not present

## 2015-05-13 DIAGNOSIS — E7800 Pure hypercholesterolemia, unspecified: Secondary | ICD-10-CM | POA: Diagnosis not present

## 2015-05-13 DIAGNOSIS — Z9181 History of falling: Secondary | ICD-10-CM | POA: Diagnosis not present

## 2015-05-15 DIAGNOSIS — M80022D Age-related osteoporosis with current pathological fracture, left humerus, subsequent encounter for fracture with routine healing: Secondary | ICD-10-CM | POA: Diagnosis not present

## 2015-05-15 DIAGNOSIS — I10 Essential (primary) hypertension: Secondary | ICD-10-CM | POA: Diagnosis not present

## 2015-05-15 DIAGNOSIS — E7800 Pure hypercholesterolemia, unspecified: Secondary | ICD-10-CM | POA: Diagnosis not present

## 2015-05-15 DIAGNOSIS — Z9181 History of falling: Secondary | ICD-10-CM | POA: Diagnosis not present

## 2015-05-20 DIAGNOSIS — M80022D Age-related osteoporosis with current pathological fracture, left humerus, subsequent encounter for fracture with routine healing: Secondary | ICD-10-CM | POA: Diagnosis not present

## 2015-05-20 DIAGNOSIS — E7800 Pure hypercholesterolemia, unspecified: Secondary | ICD-10-CM | POA: Diagnosis not present

## 2015-05-20 DIAGNOSIS — I10 Essential (primary) hypertension: Secondary | ICD-10-CM | POA: Diagnosis not present

## 2015-05-20 DIAGNOSIS — Z9181 History of falling: Secondary | ICD-10-CM | POA: Diagnosis not present

## 2015-05-23 DIAGNOSIS — M80022D Age-related osteoporosis with current pathological fracture, left humerus, subsequent encounter for fracture with routine healing: Secondary | ICD-10-CM | POA: Diagnosis not present

## 2015-05-23 DIAGNOSIS — I10 Essential (primary) hypertension: Secondary | ICD-10-CM | POA: Diagnosis not present

## 2015-05-23 DIAGNOSIS — Z9181 History of falling: Secondary | ICD-10-CM | POA: Diagnosis not present

## 2015-05-23 DIAGNOSIS — E7800 Pure hypercholesterolemia, unspecified: Secondary | ICD-10-CM | POA: Diagnosis not present

## 2015-05-30 DIAGNOSIS — Z8781 Personal history of (healed) traumatic fracture: Secondary | ICD-10-CM | POA: Diagnosis not present

## 2015-05-30 DIAGNOSIS — Z967 Presence of other bone and tendon implants: Secondary | ICD-10-CM | POA: Diagnosis not present

## 2015-07-25 ENCOUNTER — Ambulatory Visit: Payer: Self-pay | Admitting: Adult Health

## 2015-07-29 ENCOUNTER — Encounter: Payer: Self-pay | Admitting: Adult Health

## 2015-08-03 ENCOUNTER — Emergency Department (HOSPITAL_BASED_OUTPATIENT_CLINIC_OR_DEPARTMENT_OTHER): Payer: Commercial Managed Care - HMO

## 2015-08-03 ENCOUNTER — Emergency Department (HOSPITAL_BASED_OUTPATIENT_CLINIC_OR_DEPARTMENT_OTHER)
Admission: EM | Admit: 2015-08-03 | Discharge: 2015-08-04 | Disposition: A | Discharge status: Home / Self Care | Payer: Commercial Managed Care - HMO | Attending: Emergency Medicine | Admitting: Emergency Medicine

## 2015-08-03 ENCOUNTER — Encounter (HOSPITAL_BASED_OUTPATIENT_CLINIC_OR_DEPARTMENT_OTHER): Payer: Self-pay | Admitting: Emergency Medicine

## 2015-08-03 DIAGNOSIS — S0511XA Contusion of eyeball and orbital tissues, right eye, initial encounter: Secondary | ICD-10-CM | POA: Diagnosis not present

## 2015-08-03 DIAGNOSIS — Y999 Unspecified external cause status: Secondary | ICD-10-CM | POA: Insufficient documentation

## 2015-08-03 DIAGNOSIS — S199XXA Unspecified injury of neck, initial encounter: Secondary | ICD-10-CM | POA: Diagnosis not present

## 2015-08-03 DIAGNOSIS — S6991XA Unspecified injury of right wrist, hand and finger(s), initial encounter: Secondary | ICD-10-CM | POA: Diagnosis not present

## 2015-08-03 DIAGNOSIS — S0083XA Contusion of other part of head, initial encounter: Secondary | ICD-10-CM | POA: Diagnosis not present

## 2015-08-03 DIAGNOSIS — Z7982 Long term (current) use of aspirin: Secondary | ICD-10-CM | POA: Insufficient documentation

## 2015-08-03 DIAGNOSIS — W1839XA Other fall on same level, initial encounter: Secondary | ICD-10-CM | POA: Diagnosis not present

## 2015-08-03 DIAGNOSIS — I10 Essential (primary) hypertension: Secondary | ICD-10-CM | POA: Diagnosis not present

## 2015-08-03 DIAGNOSIS — S0993XA Unspecified injury of face, initial encounter: Secondary | ICD-10-CM | POA: Diagnosis not present

## 2015-08-03 DIAGNOSIS — S61412A Laceration without foreign body of left hand, initial encounter: Secondary | ICD-10-CM | POA: Diagnosis not present

## 2015-08-03 DIAGNOSIS — Y92009 Unspecified place in unspecified non-institutional (private) residence as the place of occurrence of the external cause: Secondary | ICD-10-CM | POA: Diagnosis not present

## 2015-08-03 DIAGNOSIS — S0990XA Unspecified injury of head, initial encounter: Secondary | ICD-10-CM | POA: Diagnosis not present

## 2015-08-03 DIAGNOSIS — Y939 Activity, unspecified: Secondary | ICD-10-CM | POA: Insufficient documentation

## 2015-08-03 DIAGNOSIS — S61411A Laceration without foreign body of right hand, initial encounter: Secondary | ICD-10-CM

## 2015-08-03 DIAGNOSIS — M79641 Pain in right hand: Secondary | ICD-10-CM | POA: Diagnosis not present

## 2015-08-03 DIAGNOSIS — R42 Dizziness and giddiness: Secondary | ICD-10-CM | POA: Diagnosis not present

## 2015-08-03 DIAGNOSIS — W19XXXA Unspecified fall, initial encounter: Secondary | ICD-10-CM

## 2015-08-03 LAB — TROPONIN I: Troponin I: <0.03 ng/mL (ref <0.03)

## 2015-08-03 LAB — BASIC METABOLIC PANEL
ANION GAP: 6 (ref 5–15)
BUN: 16 mg/dL (ref 6–20)
CALCIUM: 9 mg/dL (ref 8.9–10.3)
CHLORIDE: 103 mmol/L (ref 101–111)
CO2: 28 mmol/L (ref 22–32)
CREATININE: 0.75 mg/dL (ref 0.44–1.00)
GFR calc non Af Amer: >60 mL/min (ref >60)
Glucose, Bld: 98 mg/dL (ref 65–99)
Potassium: 3.8 mmol/L (ref 3.5–5.1)
Sodium: 137 mmol/L (ref 135–145)

## 2015-08-03 LAB — CBC WITH DIFFERENTIAL/PLATELET
BASOS PCT: 0 %
Basophils Absolute: 0 10*3/uL (ref 0.0–0.1)
Eosinophils Absolute: 0.4 10*3/uL (ref 0.0–0.7)
Eosinophils Relative: 6 %
HEMATOCRIT: 37.8 % (ref 36.0–46.0)
HEMOGLOBIN: 12.3 g/dL (ref 12.0–15.0)
LYMPHS ABS: 1.7 10*3/uL (ref 0.7–4.0)
Lymphocytes Relative: 24 %
MCH: 31.2 pg (ref 26.0–34.0)
MCHC: 32.5 g/dL (ref 30.0–36.0)
MCV: 95.9 fL (ref 78.0–100.0)
MONO ABS: 0.8 10*3/uL (ref 0.1–1.0)
MONOS PCT: 11 %
NEUTROS ABS: 4.3 10*3/uL (ref 1.7–7.7)
NEUTROS PCT: 59 %
Platelets: 206 10*3/uL (ref 150–400)
RBC: 3.94 MIL/uL (ref 3.87–5.11)
RDW: 15.5 % (ref 11.5–15.5)
WBC: 7.2 10*3/uL (ref 4.0–10.5)

## 2015-08-03 LAB — URINALYSIS, ROUTINE W REFLEX MICROSCOPIC
BILIRUBIN URINE: NEGATIVE
GLUCOSE, UA: NEGATIVE mg/dL
HGB URINE DIPSTICK: NEGATIVE
KETONES UR: NEGATIVE mg/dL
Nitrite: NEGATIVE
PH: 7 (ref 5.0–8.0)
Protein, ur: NEGATIVE mg/dL
SPECIFIC GRAVITY, URINE: 1.018 (ref 1.005–1.030)

## 2015-08-03 LAB — URINE MICROSCOPIC-ADD ON

## 2015-08-03 MED ORDER — TETANUS-DIPHTH-ACELL PERTUSSIS 5-2.5-18.5 LF-MCG/0.5 IM SUSP
0.5000 mL | Freq: Once | INTRAMUSCULAR | Status: AC
  Administered 2015-08-03: 0.5 mL via INTRAMUSCULAR
  Filled 2015-08-03: qty 0.5

## 2015-08-03 MED ORDER — BACITRACIN ZINC 500 UNIT/GM EX OINT
TOPICAL_OINTMENT | Freq: Two times a day (BID) | CUTANEOUS | Status: DC
  Administered 2015-08-03: 23:00:00 via TOPICAL

## 2015-08-03 NOTE — ED Provider Notes (Signed)
CSN: [id number]     Arrival date & time 08/03/15  1911 History  By signing my name below, I, Emmanuella Mensah, attest that this documentation has been prepared under the direction and in the presence of Gloriann Loan, PA-C. Electronically Signed: Judithann Sauger, ED Scribe. 08/03/2015. 8:54 PM.      Chief Complaint  Patient presents with  . Fall   The history is provided by the patient. No language interpreter was used.   HPI Comments: Julie Martinez is a 82 y.o. female with a hx of hypertension, osteopenia, and high cholesterol who presents to the Emergency Department complaining of ongoing right hand pain with a laceration and a laceration to her right face s/p unwitnessed fall that occurred this afternoon. Pt explains that she became lightheaded as she was bending over to wipe something on the floor causing her to fall on her outstretched right hand. No LOC. She denies any vertiginous signs with her lightheadedness, stating that she just has near-syncopal episodes with it. Pt adds that she has been having increasing episodes of dizziness/lightheadeness recently. They "don't last long" and are relieved with sitting down.  Pt has not tried any medications PTA. She states that she is unsure of last tetanus vaccine. She denies any abdominal pain, chest pain, SOB, or n/v. She is not anticoagulated.    Past Medical History  Diagnosis Date  . Hypertension   . Osteopenia   . High cholesterol    Past Surgical History  Procedure Laterality Date  . Abdominal hysterectomy    . Eye surgery     Family History  Problem Relation Age of Onset  . Heart failure Mother    Social History  Substance Use Topics  . Smoking status: Never Smoker   . Smokeless tobacco: None  . Alcohol Use: No   OB History    No data available     Review of Systems  Respiratory: Negative for shortness of breath.   Gastrointestinal: Negative for nausea, vomiting and abdominal pain.  Musculoskeletal: Positive for  arthralgias.  Skin: Positive for wound.  Neurological: Positive for light-headedness.  All other systems reviewed and are negative.     Allergies  Review of patient's allergies indicates no known allergies.  Home Medications   Prior to Admission medications   Medication Sig Start Date End Date Taking? Authorizing Provider  alendronate (FOSAMAX) 70 MG tablet Take 70 mg by mouth once a week. Take with a full glass of water on an empty stomach.    Historical Provider, MD  aspirin 81 MG EC tablet Take 81 mg by mouth daily.    Historical Provider, MD  donepezil (ARICEPT) 10 MG tablet Take 1 tablet (10 mg total) by mouth at bedtime. 01/25/15   Melvenia Beam, MD  quinapril (ACCUPRIL) 20 MG tablet Take 20 mg by mouth at bedtime.    Historical Provider, MD  sertraline (ZOLOFT) 25 MG tablet Take 25 mg by mouth daily.    Historical Provider, MD  triamterene-hydrochlorothiazide (DYAZIDE) 37.5-25 MG capsule Take 1 capsule by mouth daily. 10/05/14   Historical Provider, MD   BP 186/64 mmHg  Pulse 59  Temp(Src) 98.3 F (36.8 C) (Oral)  Resp 16  Wt 50.349 kg  SpO2 100% Physical Exam  Constitutional: She is oriented to person, place, and time. She appears well-developed and well-nourished.  Non-toxic appearance. She does not have a sickly appearance. She does not appear ill.  HENT:  Head: Normocephalic.  Mouth/Throat: Oropharynx is clear and moist.  Bruising to right superior orbit.  No crepitus.  No entrapment.  Eyes: Conjunctivae and EOM are normal. Pupils are equal, round, and reactive to light.  Neck: Normal range of motion. Neck supple.  No cervical midline tenderness.   Cardiovascular: Normal rate and regular rhythm.   Pulmonary/Chest: Effort normal and breath sounds normal. No accessory muscle usage or stridor. No respiratory distress. She has no wheezes. She has no rhonchi. She has no rales.  Abdominal: Soft. Bowel sounds are normal. She exhibits no distension. There is no tenderness.  There is no rebound and no guarding.  Musculoskeletal: Normal range of motion. She exhibits tenderness.  Lymphadenopathy:    She has no cervical adenopathy.  Neurological: She is alert and oriented to person, place, and time.  Cranial nerves grossly intact.  Speech clear without dysarthria.  Normal strength and sensation throughout upper and lower extremities.  No pronator drift.  Normal RAMs and finger to nose.  Skin: Skin is warm and dry.  Skin tear to dorsum of right hand.  Psychiatric: She has a normal mood and affect. Her behavior is normal.    ED Course  Procedures (including critical care time) DIAGNOSTIC STUDIES: Oxygen Saturation is 100% on RA, normal by my interpretation.    COORDINATION OF CARE: 8:51 PM- Pt advised of plan for treatment and pt agrees. Pt will receive right hand x-ray and lab work for further evaluation. Explained that pt's presentation does not warrant a laceration repair. She will receive a Tdap.    Labs Review Labs Reviewed  URINALYSIS, ROUTINE W REFLEX MICROSCOPIC (NOT AT [facility]) - Abnormal; Notable for the following:    Leukocytes, UA MODERATE (*)    All other components within normal limits  URINE MICROSCOPIC-ADD ON - Abnormal; Notable for the following:    Squamous Epithelial / LPF 0-5 (*)    Bacteria, UA FEW (*)    All other components within normal limits  URINE CULTURE  TROPONIN I  BASIC METABOLIC PANEL  CBC WITH DIFFERENTIAL/PLATELET    Imaging Review Ct Head Wo Contrast  08/03/2015  CLINICAL DATA:  82 year old female with fall EXAM: CT HEAD WITHOUT CONTRAST CT MAXILLOFACIAL WITHOUT CONTRAST CT CERVICAL SPINE WITHOUT CONTRAST TECHNIQUE: Multidetector CT imaging of the head, cervical spine, and maxillofacial structures were performed using the standard protocol without intravenous contrast. Multiplanar CT image reconstructions of the cervical spine and maxillofacial structures were also generated. COMPARISON:  Head CT dated 11/12/2014 and MRI  dated 11/12/2014 FINDINGS: CT HEAD FINDINGS There is mild age-related atrophy and chronic microvascular ischemic changes. There is no acute intracranial hemorrhage. No mass effect or midline shift noted. There is mild mucoperiosteal thickening of paranasal sinuses. No air-fluid level. The mastoid air cells are clear. The calvarium is intact. CT MAXILLOFACIAL FINDINGS There is no acute facial bone fractures. The maxilla, mandible, and pterygoid plates are intact. The globes, retro-orbital fat, and orbital walls are preserved. Soft tissues appear unremarkable. CT CERVICAL SPINE FINDINGS There is no acute fracture or subluxation of the cervical spine.There is osteopenia with multilevel degenerative changes and facet hypertrophy.The odontoid and spinous processes are intact.There is normal anatomic alignment of the C1-C2 lateral masses. The visualized soft tissues appear unremarkable. IMPRESSION: No acute intracranial pathology. No acute/ traumatic cervical spine pathology. No acute facial bone fractures. Electronically Signed   By: Anner Crete M.D.   On: 08/03/2015 22:23   Ct Cervical Spine Wo Contrast  08/03/2015  CLINICAL DATA:  82 year old female with fall EXAM: CT HEAD WITHOUT CONTRAST CT  MAXILLOFACIAL WITHOUT CONTRAST CT CERVICAL SPINE WITHOUT CONTRAST TECHNIQUE: Multidetector CT imaging of the head, cervical spine, and maxillofacial structures were performed using the standard protocol without intravenous contrast. Multiplanar CT image reconstructions of the cervical spine and maxillofacial structures were also generated. COMPARISON:  Head CT dated 11/12/2014 and MRI dated 11/12/2014 FINDINGS: CT HEAD FINDINGS There is mild age-related atrophy and chronic microvascular ischemic changes. There is no acute intracranial hemorrhage. No mass effect or midline shift noted. There is mild mucoperiosteal thickening of paranasal sinuses. No air-fluid level. The mastoid air cells are clear. The calvarium is intact.  CT MAXILLOFACIAL FINDINGS There is no acute facial bone fractures. The maxilla, mandible, and pterygoid plates are intact. The globes, retro-orbital fat, and orbital walls are preserved. Soft tissues appear unremarkable. CT CERVICAL SPINE FINDINGS There is no acute fracture or subluxation of the cervical spine.There is osteopenia with multilevel degenerative changes and facet hypertrophy.The odontoid and spinous processes are intact.There is normal anatomic alignment of the C1-C2 lateral masses. The visualized soft tissues appear unremarkable. IMPRESSION: No acute intracranial pathology. No acute/ traumatic cervical spine pathology. No acute facial bone fractures. Electronically Signed   By: Anner Crete M.D.   On: 08/03/2015 22:23   Dg Hand Complete Right  08/03/2015  CLINICAL DATA:  82 year old female with fall and right hand pain. EXAM: RIGHT HAND - COMPLETE 3+ VIEW COMPARISON:  None. FINDINGS: There is no acute fracture or dislocation. The bones are osteopenic. There are osteoarthritic changes of the DIP joints. The soft tissues appear unremarkable with no radiopaque foreign object. IMPRESSION: No acute fracture or dislocation. Electronically Signed   By: Anner Crete M.D.   On: 08/03/2015 22:24   Ct Maxillofacial Wo Cm  08/03/2015  CLINICAL DATA:  82 year old female with fall EXAM: CT HEAD WITHOUT CONTRAST CT MAXILLOFACIAL WITHOUT CONTRAST CT CERVICAL SPINE WITHOUT CONTRAST TECHNIQUE: Multidetector CT imaging of the head, cervical spine, and maxillofacial structures were performed using the standard protocol without intravenous contrast. Multiplanar CT image reconstructions of the cervical spine and maxillofacial structures were also generated. COMPARISON:  Head CT dated 11/12/2014 and MRI dated 11/12/2014 FINDINGS: CT HEAD FINDINGS There is mild age-related atrophy and chronic microvascular ischemic changes. There is no acute intracranial hemorrhage. No mass effect or midline shift noted. There  is mild mucoperiosteal thickening of paranasal sinuses. No air-fluid level. The mastoid air cells are clear. The calvarium is intact. CT MAXILLOFACIAL FINDINGS There is no acute facial bone fractures. The maxilla, mandible, and pterygoid plates are intact. The globes, retro-orbital fat, and orbital walls are preserved. Soft tissues appear unremarkable. CT CERVICAL SPINE FINDINGS There is no acute fracture or subluxation of the cervical spine.There is osteopenia with multilevel degenerative changes and facet hypertrophy.The odontoid and spinous processes are intact.There is normal anatomic alignment of the C1-C2 lateral masses. The visualized soft tissues appear unremarkable. IMPRESSION: No acute intracranial pathology. No acute/ traumatic cervical spine pathology. No acute facial bone fractures. Electronically Signed   By: Anner Crete M.D.   On: 08/03/2015 22:23     Gloriann Loan, PA-C has personally reviewed and evaluated these images and lab results as part of her medical decision-making.  MDM   Final diagnoses:  Fall, initial encounter  Skin tear of hand without complication, right, initial encounter  Facial contusion, initial encounter   Patient presents with fall after becoming lightheaded while bending over to wipe off the floor.  Denies CP, SOB, or urinary symptoms.  Has been having lightheadedness intermittently.  VSS, NAD.  She has a hematoma to right eye and skin tear to right hand.  No cervical midline tenderness.  She is not anticoagulated.  Normal neuro exam. Will obtain orthostatics, EKG, labs, CT max/facial, head, and cervical spine.  EKG not acute, troponin normal.  Labs without acute abnormalities.  CT films negative.  UA shows moderate leukocytes, 6-30 WBCs, few bacteria.  She is asymptomatic.  Urine culture sent.  Low risk SF syncope rules.  Tetanus up dated.  Follow up PCP.  Return precautions discussed.  Patient agrees and acknowledge the above plan for discharge.   Case has been  discussed with and seen by Dr. Rogene Houston who agrees with the above plan for discharge.   I personally performed the services described in this documentation, which was scribed in my presence. The recorded information has been reviewed and is accurate.     Gloriann Loan, PA-C 08/04/15 [id number]

## 2015-08-03 NOTE — ED Notes (Signed)
Pt taken to restroom via steady, pt took 2 steps to toilet and said she felt fine, denys any dizziness.

## 2015-08-03 NOTE — ED Notes (Signed)
Patient reports that she was bending down to wipe something up and fell. Hurt hr right hand and has skin tear / laceration to her right hand. Daughter states that the patient has had "dizzy" spells lately but patient does not recall having one during this fall

## 2015-08-03 NOTE — ED Provider Notes (Signed)
Medical screening examination/treatment/procedure(s) were conducted as a shared visit with non-physician practitioner(s) and myself.  I personally evaluated the patient during the encounter.   EKG Interpretation   Date/Time:  Saturday August 03 2015 21:02:20 EDT Ventricular Rate:  60 PR Interval:    QRS Duration: 103 QT Interval:  453 QTC Calculation: 453 R Axis:   72 Text Interpretation:  Sinus rhythm Confirmed by Wanya Bangura  MD, Emslee Lopezmartinez  [id number]) on 08/03/2015 9:21:50 PM     Results for orders placed or performed during the hospital encounter of 08/03/15  CBC with Differential  Result Value Ref Range   WBC 7.2 4.0 - 10.5 K/uL   RBC 3.94 3.87 - 5.11 MIL/uL   Hemoglobin 12.3 12.0 - 15.0 g/dL   HCT 37.8 36.0 - 46.0 %   MCV 95.9 78.0 - 100.0 fL   MCH 31.2 26.0 - 34.0 pg   MCHC 32.5 30.0 - 36.0 g/dL   RDW 15.5 11.5 - 15.5 %   Platelets 206 150 - 400 K/uL   Neutrophils Relative % 59 %   Neutro Abs 4.3 1.7 - 7.7 K/uL   Lymphocytes Relative 24 %   Lymphs Abs 1.7 0.7 - 4.0 K/uL   Monocytes Relative 11 %   Monocytes Absolute 0.8 0.1 - 1.0 K/uL   Eosinophils Relative 6 %   Eosinophils Absolute 0.4 0.0 - 0.7 K/uL   Basophils Relative 0 %   Basophils Absolute 0.0 0.0 - 0.1 K/uL   No results found.  Results for orders placed or performed during the hospital encounter of 08/03/15  Troponin I  Result Value Ref Range   Troponin I <0.03 <0.03 ng/mL  Basic metabolic panel  Result Value Ref Range   Sodium 137 135 - 145 mmol/L   Potassium 3.8 3.5 - 5.1 mmol/L   Chloride 103 101 - 111 mmol/L   CO2 28 22 - 32 mmol/L   Glucose, Bld 98 65 - 99 mg/dL   BUN 16 6 - 20 mg/dL   Creatinine, Ser 0.75 0.44 - 1.00 mg/dL   Calcium 9.0 8.9 - 10.3 mg/dL   GFR calc non Af Amer >60 >60 mL/min   GFR calc Af Amer >60 >60 mL/min   Anion gap 6 5 - 15  CBC with Differential  Result Value Ref Range   WBC 7.2 4.0 - 10.5 K/uL   RBC 3.94 3.87 - 5.11 MIL/uL   Hemoglobin 12.3 12.0 - 15.0 g/dL   HCT 37.8 36.0  - 46.0 %   MCV 95.9 78.0 - 100.0 fL   MCH 31.2 26.0 - 34.0 pg   MCHC 32.5 30.0 - 36.0 g/dL   RDW 15.5 11.5 - 15.5 %   Platelets 206 150 - 400 K/uL   Neutrophils Relative % 59 %   Neutro Abs 4.3 1.7 - 7.7 K/uL   Lymphocytes Relative 24 %   Lymphs Abs 1.7 0.7 - 4.0 K/uL   Monocytes Relative 11 %   Monocytes Absolute 0.8 0.1 - 1.0 K/uL   Eosinophils Relative 6 %   Eosinophils Absolute 0.4 0.0 - 0.7 K/uL   Basophils Relative 0 %   Basophils Absolute 0.0 0.0 - 0.1 K/uL  Urinalysis, Routine w reflex microscopic (not at [facility])  Result Value Ref Range   Color, Urine YELLOW YELLOW   APPearance CLEAR CLEAR   Specific Gravity, Urine 1.018 1.005 - 1.030   pH 7.0 5.0 - 8.0   Glucose, UA NEGATIVE NEGATIVE mg/dL   Hgb urine dipstick NEGATIVE NEGATIVE  Bilirubin Urine NEGATIVE NEGATIVE   Ketones, ur NEGATIVE NEGATIVE mg/dL   Protein, ur NEGATIVE NEGATIVE mg/dL   Nitrite NEGATIVE NEGATIVE   Leukocytes, UA MODERATE (A) NEGATIVE  Urine microscopic-add on  Result Value Ref Range   Squamous Epithelial / LPF 0-5 (A) NONE SEEN   WBC, UA 6-30 0 - 5 WBC/hpf   RBC / HPF 0-5 0 - 5 RBC/hpf   Bacteria, UA FEW (A) NONE SEEN   Ct Head Wo Contrast  08/03/2015  CLINICAL DATA:  82 year old female with fall EXAM: CT HEAD WITHOUT CONTRAST CT MAXILLOFACIAL WITHOUT CONTRAST CT CERVICAL SPINE WITHOUT CONTRAST TECHNIQUE: Multidetector CT imaging of the head, cervical spine, and maxillofacial structures were performed using the standard protocol without intravenous contrast. Multiplanar CT image reconstructions of the cervical spine and maxillofacial structures were also generated. COMPARISON:  Head CT dated 11/12/2014 and MRI dated 11/12/2014 FINDINGS: CT HEAD FINDINGS There is mild age-related atrophy and chronic microvascular ischemic changes. There is no acute intracranial hemorrhage. No mass effect or midline shift noted. There is mild mucoperiosteal thickening of paranasal sinuses. No air-fluid level. The mastoid  air cells are clear. The calvarium is intact. CT MAXILLOFACIAL FINDINGS There is no acute facial bone fractures. The maxilla, mandible, and pterygoid plates are intact. The globes, retro-orbital fat, and orbital walls are preserved. Soft tissues appear unremarkable. CT CERVICAL SPINE FINDINGS There is no acute fracture or subluxation of the cervical spine.There is osteopenia with multilevel degenerative changes and facet hypertrophy.The odontoid and spinous processes are intact.There is normal anatomic alignment of the C1-C2 lateral masses. The visualized soft tissues appear unremarkable. IMPRESSION: No acute intracranial pathology. No acute/ traumatic cervical spine pathology. No acute facial bone fractures. Electronically Signed   By: Anner Crete M.D.   On: 08/03/2015 22:23   Ct Cervical Spine Wo Contrast  08/03/2015  CLINICAL DATA:  82 year old female with fall EXAM: CT HEAD WITHOUT CONTRAST CT MAXILLOFACIAL WITHOUT CONTRAST CT CERVICAL SPINE WITHOUT CONTRAST TECHNIQUE: Multidetector CT imaging of the head, cervical spine, and maxillofacial structures were performed using the standard protocol without intravenous contrast. Multiplanar CT image reconstructions of the cervical spine and maxillofacial structures were also generated. COMPARISON:  Head CT dated 11/12/2014 and MRI dated 11/12/2014 FINDINGS: CT HEAD FINDINGS There is mild age-related atrophy and chronic microvascular ischemic changes. There is no acute intracranial hemorrhage. No mass effect or midline shift noted. There is mild mucoperiosteal thickening of paranasal sinuses. No air-fluid level. The mastoid air cells are clear. The calvarium is intact. CT MAXILLOFACIAL FINDINGS There is no acute facial bone fractures. The maxilla, mandible, and pterygoid plates are intact. The globes, retro-orbital fat, and orbital walls are preserved. Soft tissues appear unremarkable. CT CERVICAL SPINE FINDINGS There is no acute fracture or subluxation of the  cervical spine.There is osteopenia with multilevel degenerative changes and facet hypertrophy.The odontoid and spinous processes are intact.There is normal anatomic alignment of the C1-C2 lateral masses. The visualized soft tissues appear unremarkable. IMPRESSION: No acute intracranial pathology. No acute/ traumatic cervical spine pathology. No acute facial bone fractures. Electronically Signed   By: Anner Crete M.D.   On: 08/03/2015 22:23   Dg Hand Complete Right  08/03/2015  CLINICAL DATA:  82 year old female with fall and right hand pain. EXAM: RIGHT HAND - COMPLETE 3+ VIEW COMPARISON:  None. FINDINGS: There is no acute fracture or dislocation. The bones are osteopenic. There are osteoarthritic changes of the DIP joints. The soft tissues appear unremarkable with no radiopaque foreign object. IMPRESSION: No acute  fracture or dislocation. Electronically Signed   By: Anner Crete M.D.   On: 08/03/2015 22:24   Ct Maxillofacial Wo Cm  08/03/2015  CLINICAL DATA:  82 year old female with fall EXAM: CT HEAD WITHOUT CONTRAST CT MAXILLOFACIAL WITHOUT CONTRAST CT CERVICAL SPINE WITHOUT CONTRAST TECHNIQUE: Multidetector CT imaging of the head, cervical spine, and maxillofacial structures were performed using the standard protocol without intravenous contrast. Multiplanar CT image reconstructions of the cervical spine and maxillofacial structures were also generated. COMPARISON:  Head CT dated 11/12/2014 and MRI dated 11/12/2014 FINDINGS: CT HEAD FINDINGS There is mild age-related atrophy and chronic microvascular ischemic changes. There is no acute intracranial hemorrhage. No mass effect or midline shift noted. There is mild mucoperiosteal thickening of paranasal sinuses. No air-fluid level. The mastoid air cells are clear. The calvarium is intact. CT MAXILLOFACIAL FINDINGS There is no acute facial bone fractures. The maxilla, mandible, and pterygoid plates are intact. The globes, retro-orbital fat, and  orbital walls are preserved. Soft tissues appear unremarkable. CT CERVICAL SPINE FINDINGS There is no acute fracture or subluxation of the cervical spine.There is osteopenia with multilevel degenerative changes and facet hypertrophy.The odontoid and spinous processes are intact.There is normal anatomic alignment of the C1-C2 lateral masses. The visualized soft tissues appear unremarkable. IMPRESSION: No acute intracranial pathology. No acute/ traumatic cervical spine pathology. No acute facial bone fractures. Electronically Signed   By: Anner Crete M.D.   On: 08/03/2015 22:23    Patient seen by me. Patient with fall at home while bending over did not pass out. Patient was skin tear to her right hand. Also struck the right side of her head she has some bruising along the right lateral forehead. CT of her face and head without any acute findings. Also x-ray of her right hand without any bony injuries. Patient stable for discharge home.  Fredia Sorrow, MD 08/03/15 [id number]

## 2015-08-03 NOTE — ED Notes (Signed)
Patient transported to CT 

## 2015-08-04 MED ORDER — BACITRACIN ZINC 500 UNIT/GM EX OINT: 1.0000 "application " | TOPICAL_OINTMENT | Freq: Two times a day (BID) | CUTANEOUS | Status: DC

## 2015-08-04 NOTE — Discharge Instructions (Signed)
Skin Tear Care A skin tear is a wound in which the top layer of skin has peeled off. This is a common problem with aging because the skin becomes thinner and more fragile as a person gets older. In addition, some medicines, such as oral corticosteroids, can lead to skin thinning if taken for long periods of time.  A skin tear is often repaired with tape or skin adhesive strips. This keeps the skin that has been peeled off in contact with the healthier skin beneath. Depending on the location of the wound, a bandage (dressing) may be applied over the tape or skin adhesive strips. Sometimes, during the healing process, the skin turns black and dies. Even when this happens, the torn skin acts as a good dressing until the skin underneath gets healthier and repairs itself. HOME CARE INSTRUCTIONS   Change dressings once per day or as directed by your caregiver.  Gently clean the skin tear and the area around the tear using saline solution or mild soap and water.  Do not rub the injured skin dry. Let the area air dry.  Apply petroleum jelly or an antibiotic cream or ointment to keep the tear moist. This will help the wound heal. Do not allow a scab to form.  If the dressing sticks before the next dressing change, moisten it with warm soapy water and gently remove it.  Protect the injured skin until it has healed.  Only take over-the-counter or prescription medicines as directed by your caregiver.  Take showers or baths using warm soapy water. Apply a new dressing after the shower or bath.  Keep all follow-up appointments as directed by your caregiver.  SEEK IMMEDIATE MEDICAL CARE IF:   You have redness, swelling, or increasing pain in the skin tear.  You havepus coming from the skin tear.  You have chills.  You have a red streak that goes away from the skin tear.  You have a bad smell coming from the tear or dressing.  You have a fever or persistent symptoms for more than 2-3  days.  You have a fever and your symptoms suddenly get worse. MAKE SURE YOU:  Understand these instructions.  Will watch this condition.  Will get help right away if your child is not doing well or gets worse.   This information is not intended to replace advice given to you by your health care provider. Make sure you discuss any questions you have with your health care provider.   Document Released: 09/30/2000 Document Revised: 09/30/2011 Document Reviewed: 07/20/2011 Elsevier Interactive Patient Education 2016 [employer].  Facial or Scalp Contusion A facial or scalp contusion is a deep bruise on the face or head. Injuries to the face and head generally cause a lot of swelling, especially around the eyes. Contusions are the result of an injury that caused bleeding under the skin. The contusion may turn blue, purple, or yellow. Minor injuries will give you a painless contusion, but more severe contusions may stay painful and swollen for a few weeks.  CAUSES  A facial or scalp contusion is caused by a blunt injury or trauma to the face or head area.  SIGNS AND SYMPTOMS   Swelling of the injured area.   Discoloration of the injured area.   Tenderness, soreness, or pain in the injured area.  DIAGNOSIS  The diagnosis can be made by taking a medical history and doing a physical exam. An X-ray exam, CT scan, or MRI may be needed to  determine if there are any associated injuries, such as broken bones (fractures). TREATMENT  Often, the best treatment for a facial or scalp contusion is applying cold compresses to the injured area. Over-the-counter medicines may also be recommended for pain control.  HOME CARE INSTRUCTIONS   Only take over-the-counter or prescription medicines as directed by your health care provider.   Apply ice to the injured area.   Put ice in a plastic bag.   Place a towel between your skin and the bag.   Leave the ice on for 20 minutes, 2-3 times a day.   SEEK MEDICAL CARE IF:  You have bite problems.   You have pain with chewing.   You are concerned about facial defects. SEEK IMMEDIATE MEDICAL CARE IF:  You have severe pain or a headache that is not relieved by medicine.   You have unusual sleepiness, confusion, or personality changes.   You throw up (vomit).   You have a persistent nosebleed.   You have double vision or blurred vision.   You have fluid drainage from your nose or ear.   You have difficulty walking or using your arms or legs.  MAKE SURE YOU:   Understand these instructions.  Will watch your condition.  Will get help right away if you are not doing well or get worse.   This information is not intended to replace advice given to you by your health care provider. Make sure you discuss any questions you have with your health care provider.   Document Released: 02/13/2004 Document Revised: 01/26/2014 Document Reviewed: 08/18/2012 Elsevier Interactive Patient Education [employer].

## 2015-08-05 LAB — URINE CULTURE

## 2015-09-26 DIAGNOSIS — F419 Anxiety disorder, unspecified: Secondary | ICD-10-CM | POA: Diagnosis not present

## 2015-09-26 DIAGNOSIS — R42 Dizziness and giddiness: Secondary | ICD-10-CM | POA: Diagnosis not present

## 2015-09-26 DIAGNOSIS — R413 Other amnesia: Secondary | ICD-10-CM | POA: Diagnosis not present

## 2015-09-26 DIAGNOSIS — I10 Essential (primary) hypertension: Secondary | ICD-10-CM | POA: Diagnosis not present

## 2015-10-17 DIAGNOSIS — F339 Major depressive disorder, recurrent, unspecified: Secondary | ICD-10-CM | POA: Diagnosis not present

## 2015-10-17 DIAGNOSIS — E782 Mixed hyperlipidemia: Secondary | ICD-10-CM | POA: Diagnosis not present

## 2015-10-17 DIAGNOSIS — I10 Essential (primary) hypertension: Secondary | ICD-10-CM | POA: Diagnosis not present

## 2015-10-17 DIAGNOSIS — M8580 Other specified disorders of bone density and structure, unspecified site: Secondary | ICD-10-CM | POA: Diagnosis not present

## 2015-10-17 DIAGNOSIS — E559 Vitamin D deficiency, unspecified: Secondary | ICD-10-CM | POA: Diagnosis not present

## 2015-10-17 DIAGNOSIS — Z23 Encounter for immunization: Secondary | ICD-10-CM | POA: Diagnosis not present

## 2015-10-17 DIAGNOSIS — Z8589 Personal history of malignant neoplasm of other organs and systems: Secondary | ICD-10-CM | POA: Diagnosis not present

## 2015-10-17 DIAGNOSIS — R413 Other amnesia: Secondary | ICD-10-CM | POA: Diagnosis not present

## 2015-10-17 DIAGNOSIS — F419 Anxiety disorder, unspecified: Secondary | ICD-10-CM | POA: Diagnosis not present

## 2015-10-28 DIAGNOSIS — D0439 Carcinoma in situ of skin of other parts of face: Secondary | ICD-10-CM | POA: Diagnosis not present

## 2015-10-28 DIAGNOSIS — L570 Actinic keratosis: Secondary | ICD-10-CM | POA: Diagnosis not present

## 2015-10-28 DIAGNOSIS — X32XXXD Exposure to sunlight, subsequent encounter: Secondary | ICD-10-CM | POA: Diagnosis not present

## 2015-11-11 ENCOUNTER — Encounter: Payer: Self-pay | Admitting: Adult Health

## 2015-11-11 ENCOUNTER — Ambulatory Visit (INDEPENDENT_AMBULATORY_CARE_PROVIDER_SITE_OTHER): Payer: Commercial Managed Care - HMO | Admitting: Adult Health

## 2015-11-11 VITALS — BP 122/60 | HR 88 | Resp 18 | Ht 62.0 in | Wt 110.5 lb

## 2015-11-11 DIAGNOSIS — R413 Other amnesia: Secondary | ICD-10-CM

## 2015-11-11 DIAGNOSIS — F4321 Adjustment disorder with depressed mood: Secondary | ICD-10-CM

## 2015-11-11 MED ORDER — DONEPEZIL HCL 10 MG PO TABS: 10.0000 mg | ORAL_TABLET | Freq: Every day | ORAL | 11 refills | Status: DC

## 2015-11-11 NOTE — Progress Notes (Signed)
PATIENT: Julie Martinez DOB: 04-Aug-1931  REASON FOR VISIT: follow up- memory HISTORY FROM: patient  HISTORY OF PRESENT ILLNESS: Today October 20 13,017 [name] is an 82 year old female with a history of memory disturbance. She returns today for follow-up. She was started on Aricept and has been tolerating it well. She continues to live home alone. She is able to complete all ADLs independently. She doesoperate a motor vehicle but only drives short distances. Her daughter is with her today and reports that she has turned on the wrong roads when driving to familiar places. This does not happen often. She does have a hard time sleeping. She states that she is often thinking of her deceased son. He passed away in 07/27/2014 from Joice disease. She does feel that she suffers from depression. Her primary care recently increase Zoloft to 3 times a day. Patient returns today for an evaluation.  HISTORY 01/25/15: [name] is a 82 y.o. female here as a referral from Dr. Drema Dallas for memory problems. Past medical history of hypertension, anxiety and depression. Here with daughter who provides most information. Memory problems at least the last year. Has been worse since 27-Jul-2022 when her son passed away from [place]. Daughter noticed she forgot a funeral she went to of daughter's brother in law in November. Son was living his mother for many years since the late nighties. They noticed more memory problems since he died. Husband passed away in 1997/98. She has been depressed since her son died. She says his dog died recently (daughter says the dog died many years ago). Patient pays the bills and she has paid all of them. No missing bills, she owns the house, all the utilities are paid. Daughter reminds mother and takes her to all appointments. Daughter goes over every day. No accidents in the home. Patient is alone a lot and she is depressed. atient repeats herself a lot, that has been going on for the last  year. She doesn't remember telling daughter many things. Brother with dementia.    Reviewed notes, labs and imaging from outside physicians, which showed:   Reviewed notes from [facility]. Patient has had problems with memory, the Mini-Mental Status exam done on 1024 had a score of 25 out of 30. Patient has also had a number of losses recently her son died of ALS, her dog died and recently a son-in-law die. Patient had forgotten that the son-in-law died. Patient has a history of hypertension, uncertain if she is actually taking. She was prescribed in September 90 day prescription. She has not been checking her blood pressures at home. Diagnosed with memory problems and essential hypertension, mixed hyperlipidemia, anxiety and depression, dizziness. She was seen at the emergency room for dizziness and she was giving meclizine, the dizziness improved. 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 She is on sertraline and that dose was increased recently. She had reported she wasn't sleeping well. Notes noted that she had a sad mood and affect.  Personally reviewed images of the brain, MRI w/o 10/2013:  1. No acute intracranial abnormality. 2. 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   REVIEW OF SYSTEMS: Out of a complete 14 system review of symptoms, the patient complains only of the following symptoms, and all other reviewed systems are negative.  Ringing in ears, runny nose, drooling, shortness of  breath, choking, blurred vision, eye itching, eye redness, incontinence of bladder, urgency, joint pain, aching muscles, walking difficulty, frequent waking, daytime sleepiness, restless leg, memory loss, dizziness, weakness, agitation, confusion, depression, nervous/anxious, bruise/bleed easily  ALLERGIES: No Known Allergies  HOME MEDICATIONS: Outpatient  Medications Prior to Visit  Medication Sig Dispense Refill  . alendronate (FOSAMAX) 70 MG tablet Take 70 mg by mouth once a week. Take with a full glass of water on an empty stomach.    Marland Kitchen aspirin 81 MG EC tablet Take 81 mg by mouth daily.    . quinapril (ACCUPRIL) 20 MG tablet Take 20 mg by mouth at bedtime.    . sertraline (ZOLOFT) 25 MG tablet Take 25 mg by mouth daily.    Marland Kitchen triamterene-hydrochlorothiazide (DYAZIDE) 37.5-25 MG capsule Take 1 capsule by mouth daily.  0  . donepezil (ARICEPT) 10 MG tablet Take 1 tablet (10 mg total) by mouth at bedtime. 30 tablet 11  . bacitracin ointment Apply 1 application topically 2 (two) times daily. (Patient not taking: Reported on 11/11/2015) 120 g 0   No facility-administered medications prior to visit.     PAST MEDICAL HISTORY: Past Medical History:  Diagnosis Date  . High cholesterol   . Hypertension   . Osteopenia     PAST SURGICAL HISTORY: Past Surgical History:  Procedure Laterality Date  . ABDOMINAL HYSTERECTOMY    . EYE SURGERY      FAMILY HISTORY: Family History  Problem Relation Age of Onset  . Heart failure Mother     SOCIAL HISTORY: Social History   Social History  . Marital status: Widowed    Spouse name: N/A  . Number of children: 3  . Years of education: 1th   Occupational History  . n/a    Social History Main Topics  . Smoking status: Never Smoker  . Smokeless tobacco: Not on file  . Alcohol use No  . Drug use: No  . Sexual activity: Not on file   Other Topics Concern  . Not on file   Social History Narrative   Patient lives at home alone.   Caffeine Use: 1/2 cup of coffee daily      PHYSICAL EXAM  Vitals:   11/11/15 1116  BP: 122/60  Pulse: 88  Resp: 18  Weight: 110 lb 8 oz (50.1 kg)  Height: 5\' 2"  (1.575 m)   Body mass index is 20.21 kg/m.    Generalized: Well developed, in no acute distress   Neurological examination  Mentation: Alert oriented to time, place, history taking.  Follows all commands speech and language fluent. MMSE 26/30 Cranial nerve II-XII: Pupils were equal round reactive to light. Extraocular movements were full, visual field were full on confrontational test. Facial sensation and strength were normal. Uvula tongue midline. Head turning and shoulder shrug  were normal and symmetric. Motor: The motor testing reveals 5 over 5 strength of all 4 extremities. Good symmetric motor tone is noted throughout.  Sensory: Sensory testing is intact to soft touch on all 4 extremities. No evidence of extinction is noted.  Coordination: Cerebellar testing reveals good finger-nose-finger and heel-to-shin bilaterally.  Gait and station: Gait is normal. Tandem gait is normal. Romberg is negative. No drift is seen.  Reflexes: Deep tendon reflexes are symmetric and normal bilaterally.   DIAGNOSTIC DATA (LABS, IMAGING, TESTING) - I reviewed patient records, labs, notes, testing and imaging myself where available.  Lab Results  Component Value Date   WBC 7.2 08/03/2015   HGB 12.3  08/03/2015   HCT 37.8 08/03/2015   MCV 95.9 08/03/2015   PLT 206 08/03/2015      Component Value Date/Time   NA 137 08/03/2015 2110   K 3.8 08/03/2015 2110   CL 103 08/03/2015 2110   CO2 28 08/03/2015 2110   GLUCOSE 98 08/03/2015 2110   BUN 16 08/03/2015 2110   CREATININE 0.75 08/03/2015 2110   CALCIUM 9.0 08/03/2015 2110   GFRNONAA >60 08/03/2015 2110   GFRAA >60 08/03/2015 2110    Lab Results  Component Value Date   VITAMINB12 315 01/25/2015   Lab Results  Component Value Date   TSH 1.200 01/25/2015      ASSESSMENT AND PLAN 82 y.o. year old female  has a past medical history of High cholesterol; Hypertension; and Osteopenia. here with:  1. Memory disturbance  The patient's memory score has remained stable. She will remain on Aricept. The patient does suffer from depression which could be the cause of her memory disturbance. At this time they do not wish to proceed  with neuropsychological testing. Patient and her daughter advised that if her symptoms worsen or she develops new symptoms she should let us know. Follow-up in 6 months or sooner if needed.     Ward Givens, MSN, NP-C 11/11/2015, 11:42 AM [facility] [address] [place], [place] [address] [phone]

## 2015-11-11 NOTE — Patient Instructions (Signed)
Continue Aricept If your symptoms worsen or you develop new symptoms please let us know.   

## 2015-11-18 NOTE — Progress Notes (Signed)
I have reviewed and agreed above plan. 

## 2015-12-18 DIAGNOSIS — D0439 Carcinoma in situ of skin of other parts of face: Secondary | ICD-10-CM | POA: Diagnosis not present

## 2015-12-18 DIAGNOSIS — L570 Actinic keratosis: Secondary | ICD-10-CM | POA: Diagnosis not present

## 2015-12-18 DIAGNOSIS — X32XXXD Exposure to sunlight, subsequent encounter: Secondary | ICD-10-CM | POA: Diagnosis not present

## 2016-01-15 DIAGNOSIS — X32XXXD Exposure to sunlight, subsequent encounter: Secondary | ICD-10-CM | POA: Diagnosis not present

## 2016-01-15 DIAGNOSIS — L570 Actinic keratosis: Secondary | ICD-10-CM | POA: Diagnosis not present

## 2016-01-15 DIAGNOSIS — D044 Carcinoma in situ of skin of scalp and neck: Secondary | ICD-10-CM | POA: Diagnosis not present

## 2016-02-12 ENCOUNTER — Other Ambulatory Visit: Payer: Self-pay | Admitting: Neurology

## 2016-02-24 DIAGNOSIS — M542 Cervicalgia: Secondary | ICD-10-CM | POA: Diagnosis not present

## 2016-02-24 DIAGNOSIS — S0990XA Unspecified injury of head, initial encounter: Secondary | ICD-10-CM | POA: Diagnosis not present

## 2016-02-24 DIAGNOSIS — M5126 Other intervertebral disc displacement, lumbar region: Secondary | ICD-10-CM | POA: Diagnosis not present

## 2016-02-24 DIAGNOSIS — M4306 Spondylolysis, lumbar region: Secondary | ICD-10-CM | POA: Diagnosis not present

## 2016-02-24 DIAGNOSIS — M47816 Spondylosis without myelopathy or radiculopathy, lumbar region: Secondary | ICD-10-CM | POA: Diagnosis not present

## 2016-02-24 DIAGNOSIS — E7800 Pure hypercholesterolemia, unspecified: Secondary | ICD-10-CM | POA: Diagnosis not present

## 2016-02-24 DIAGNOSIS — Z85828 Personal history of other malignant neoplasm of skin: Secondary | ICD-10-CM | POA: Diagnosis not present

## 2016-02-24 DIAGNOSIS — I10 Essential (primary) hypertension: Secondary | ICD-10-CM | POA: Diagnosis not present

## 2016-02-24 DIAGNOSIS — N3090 Cystitis, unspecified without hematuria: Secondary | ICD-10-CM | POA: Diagnosis not present

## 2016-02-24 DIAGNOSIS — M5136 Other intervertebral disc degeneration, lumbar region: Secondary | ICD-10-CM | POA: Diagnosis not present

## 2016-02-24 DIAGNOSIS — Z79899 Other long term (current) drug therapy: Secondary | ICD-10-CM | POA: Diagnosis not present

## 2016-02-24 DIAGNOSIS — Z7982 Long term (current) use of aspirin: Secondary | ICD-10-CM | POA: Diagnosis not present

## 2016-02-27 DIAGNOSIS — J101 Influenza due to other identified influenza virus with other respiratory manifestations: Secondary | ICD-10-CM | POA: Diagnosis not present

## 2016-02-27 DIAGNOSIS — R531 Weakness: Secondary | ICD-10-CM | POA: Diagnosis not present

## 2016-02-27 DIAGNOSIS — N390 Urinary tract infection, site not specified: Secondary | ICD-10-CM | POA: Diagnosis not present

## 2016-03-29 ENCOUNTER — Other Ambulatory Visit: Payer: Self-pay | Admitting: Neurology

## 2016-04-27 ENCOUNTER — Telehealth: Payer: Self-pay | Admitting: Adult Health

## 2016-04-27 NOTE — Telephone Encounter (Signed)
I would have her see Dr. Drema Dallas asap. This may not be neurological it may be hip pain or arthritis. Also if this was acute she should have gone to the emergency room for stroke workup. Have her see Dr. Drema Dallas first and if she feels it is neurologic we will see her. thanks

## 2016-04-27 NOTE — Telephone Encounter (Addendum)
Attempted to reach daughter on mobile; voice mailbox not set up. Called home phone, and patient answered. She stated her daughter is at work, will not be home until 2:30 am. This RN stated Dr Jaynee Eagles advises she call her PCP, Dr Drema Dallas about her leg issue. Patient stated "I may call her after a while." This RN will try to call daughter later today.

## 2016-04-27 NOTE — Telephone Encounter (Signed)
Pt daughter called re: last 3-4 days in the morning her mothers left leg is very hard to move but later in day it gets better but still not 100% Dr Cathren Laine availability is beyond next appointment with [facility].  With this being a symptom pt daughter would like to know if Jinny Blossom can address this problem as well when doing her f/u..c  Pt has been put on wait list should anything open up b4 next appointment

## 2016-04-28 NOTE — Telephone Encounter (Signed)
Reached daughter, [name], on [place] and gave her Dr Cathren Laine message. She stated she would call Dr Drema Dallas today and schedule an appointment. She verbalized understanding, appreciation of call.

## 2016-04-30 DIAGNOSIS — M5432 Sciatica, left side: Secondary | ICD-10-CM | POA: Diagnosis not present

## 2016-04-30 DIAGNOSIS — M545 Low back pain: Secondary | ICD-10-CM | POA: Diagnosis not present

## 2016-05-11 ENCOUNTER — Encounter: Payer: Self-pay | Admitting: Adult Health

## 2016-05-11 ENCOUNTER — Encounter (INDEPENDENT_AMBULATORY_CARE_PROVIDER_SITE_OTHER): Payer: Self-pay

## 2016-05-11 ENCOUNTER — Ambulatory Visit (INDEPENDENT_AMBULATORY_CARE_PROVIDER_SITE_OTHER): Payer: Medicare HMO | Admitting: Adult Health

## 2016-05-11 VITALS — BP 162/64 | HR 70 | Resp 14 | Ht 62.0 in | Wt 110.0 lb

## 2016-05-11 DIAGNOSIS — R269 Unspecified abnormalities of gait and mobility: Secondary | ICD-10-CM

## 2016-05-11 DIAGNOSIS — R413 Other amnesia: Secondary | ICD-10-CM

## 2016-05-11 MED ORDER — MEMANTINE HCL 5 MG PO TABS: 5.0000 mg | ORAL_TABLET | Freq: Two times a day (BID) | ORAL | 11 refills | Status: DC

## 2016-05-11 NOTE — Patient Instructions (Addendum)
Start Namenda 5 mg twice a day Continue Aricept If your symptoms worsen or you develop new symptoms please let us know.   Memantine Tablets What is this medicine? MEMANTINE (MEM an teen) is used to treat dementia caused by Alzheimer's disease. This medicine may be used for other purposes; ask your health care provider or pharmacist if you have questions. COMMON BRAND NAME(S): Namenda What should I tell my health care provider before I take this medicine? They need to know if you have any of these conditions: -difficulty passing urine -kidney disease -liver disease -seizures -an unusual or allergic reaction to memantine, other medicines, foods, dyes, or preservatives -pregnant or trying to get pregnant -breast-feeding How should I use this medicine? Take this medicine by mouth with a glass of water. Follow the directions on the prescription label. You may take this medicine with or without food. Take your doses at regular intervals. Do not take your medicine more often than directed. Continue to take your medicine even if you feel better. Do not stop taking except on the advice of your doctor or health care professional. Talk to your pediatrician regarding the use of this medicine in children. Special care may be needed. Overdosage: If you think you have taken too much of this medicine contact a poison control center or emergency room at once. NOTE: This medicine is only for you. Do not share this medicine with others. What if I miss a dose? If you miss a dose, take it as soon as you can. If it is almost time for your next dose, take only that dose. Do not take double or extra doses. If you do not take your medicine for several days, contact your health care provider. Your dose may need to be changed. What may interact with this  medicine? -acetazolamide -amantadine -cimetidine -dextromethorphan -dofetilide -hydrochlorothiazide -ketamine -metformin -methazolamide -quinidine -ranitidine -sodium bicarbonate -triamterene This list may not describe all possible interactions. Give your health care provider a list of all the medicines, herbs, non-prescription drugs, or dietary supplements you use. Also tell them if you smoke, drink alcohol, or use illegal drugs. Some items may interact with your medicine. What should I watch for while using this medicine? Visit your doctor or health care professional for regular checks on your progress. Check with your doctor or health care professional if there is no improvement in your symptoms or if they get worse. You may get drowsy or dizzy. Do not drive, use machinery, or do anything that needs mental alertness until you know how this drug affects you. Do not stand or sit up quickly, especially if you are an older patient. This reduces the risk of dizzy or fainting spells. Alcohol can make you more drowsy and dizzy. Avoid alcoholic drinks. What side effects may I notice from receiving this medicine? Side effects that you should report to your doctor or health care professional as soon as possible: -allergic reactions like skin rash, itching or hives, swelling of the face, lips, or tongue -agitation or a feeling of restlessness -depressed mood -dizziness -hallucinations -redness, blistering, peeling or loosening of the skin, including inside the mouth -seizures -vomiting Side effects that usually do not require medical attention (report to your doctor or health care professional if they continue or are bothersome): -constipation -diarrhea -headache -nausea -trouble sleeping This list may not describe all possible side effects. Call your doctor for medical advice about side effects. You may report side effects to FDA at 1-800-FDA-1088. Where should I keep my  medicine? Keep  out of the reach of children. Store at room temperature between 15 degrees and 30 degrees C (59 degrees and 86 degrees F). Throw away any unused medicine after the expiration date. NOTE: This sheet is a summary. It may not cover all possible information. If you have questions about this medicine, talk to your doctor, pharmacist, or health care provider.  2018 Elsevier/[employer] Standard (2012-10-24 14:10:42)

## 2016-05-11 NOTE — Progress Notes (Signed)
PATIENT: Julie Martinez DOB: [birth year]  REASON FOR VISIT: follow up- memory HISTORY FROM: patient  HISTORY OF PRESENT ILLNESS: Julie Martinez is a 83 year old female with a history of memory disturbance. She returns today for follow-up. She is here with her daughter. She continues to live home alone. She is able to complete all ADLs independently. Her daughter reports that she does not do a lot of cooking. Her daughter helps her with her finances. The patient is able to manage her own medication. She denies any trouble sleeping. She continues on Aricept 10 mg at bedtime. She hasn't seen her primary care for arthritis. Her daughter primarily notices that she is repetitive with questions and she sometimes will confuse people. Patient reports that her balance is slightly off. She does have a walker but does not use it consistently. She returns today for an evaluation.  HISTORY 11/11/15: Julie Martinez is an 83 year old female with a history of memory disturbance. She returns today for follow-up. She was started on Aricept and has been tolerating it well. She continues to live home alone. She is able to complete all ADLs independently. She doesoperate a motor vehicle but only drives short distances. Her daughter is with her today and reports that she has turned on the wrong roads when driving to familiar places. This does not happen often. She does have a hard time sleeping. She states that she is often thinking of her deceased son. He passed away in 07/27/2014 from Ada disease. She does feel that she suffers from depression. Her primary care recently increase Zoloft to 3 times a day. Patient returns today for an evaluation.  HISTORY 01/25/15: Julie Martinez a 83 y.o.femalehere as a referral from Dr. Pat Kocher memory problems. Past medical history of hypertension, anxiety and depression. Here with daughter who provides most information. Memory problems at least the last year. Has been worse  since 2022-07-27 when her son passed away from [place]. Daughter noticed she forgot a funeral she went to of daughter's brother in law in November. Son was living his mother for many years since the late nighties. They noticed more memory problems since he died. Husband passed away in 1997/98. She has been depressed since her son died. She says his dog died recently (daughter says the dog died many years ago). Patient pays the bills and she has paid all of them. No missing bills, she owns the house, all the utilities are paid. Daughter reminds mother and takes her to all appointments. Daughter goes over every day. No accidents in the home. Patient is alone a lot and she is depressed. atient repeats herself a lot, that has been going on for the last year. She doesn't remember telling daughter many things. Brother with dementia.   Reviewed notes, labs and imaging from outside physicians, which showed:   Reviewed notes from [facility]. Patient has had problems with memory, the Mini-Mental Status exam done on 1024 had a score of 25 out of 30. Patient has also had a number of losses recently her son died of ALS, her dog died and recently a son-in-law die. Patient had forgotten that the son-in-law died. Patient has a history of hypertension, uncertain if she is actually taking. She was prescribed in September 90 day prescription. She has not been checking her blood pressures at home. Diagnosed with memory problems and essential hypertension, mixed hyperlipidemia, anxiety and depression, dizziness. She was seen at the emergency room for dizziness and she was giving meclizine,  the dizziness improved. 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 She is on sertraline and that dose was increased recently. She had reported she wasn't sleeping well. Notes noted that she had a sad mood and affect.  Personally reviewed images of the brain, MRI w/o  10/2013: 1. No acute intracranial abnormality. 2. 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     REVIEW OF SYSTEMS: Out of a complete 14 system review of symptoms, the patient complains only of the following symptoms, and all other reviewed systems are negative.  Joint pain, back pain, aching muscles, muscle cramps, walking difficulty, neck stiffness, agitation, confusion, memory loss, bruise/bleed easily, runny nose  ALLERGIES: No Known Allergies  HOME MEDICATIONS: Outpatient Medications Prior to Visit  Medication Sig Dispense Refill  . alendronate (FOSAMAX) 70 MG tablet Take 70 mg by mouth once a week. Take with a full glass of water on an empty stomach.    Marland Kitchen aspirin 81 MG EC tablet Take 81 mg by mouth daily.    . bacitracin ointment Apply 1 application topically 2 (two) times daily. 120 g 0  . donepezil (ARICEPT) 10 MG tablet Take 1 tablet (10 mg total) by mouth at bedtime. 30 tablet 11  . quinapril (ACCUPRIL) 20 MG tablet Take 20 mg by mouth at bedtime.    . sertraline (ZOLOFT) 25 MG tablet Take 25 mg by mouth daily.    Marland Kitchen triamterene-hydrochlorothiazide (DYAZIDE) 37.5-25 MG capsule Take 1 capsule by mouth daily.  0   No facility-administered medications prior to visit.     PAST MEDICAL HISTORY: Past Medical History:  Diagnosis Date  . High cholesterol   . Hypertension   . Osteopenia     PAST SURGICAL HISTORY: Past Surgical History:  Procedure Laterality Date  . ABDOMINAL HYSTERECTOMY    . EYE SURGERY      FAMILY HISTORY: Family History  Problem Relation Age of Onset  . Heart failure Mother     SOCIAL HISTORY: Social History   Social History  . Marital status: Widowed    Spouse name: N/A  . Number of children: 3  . Years of education: 1th   Occupational History  . n/a    Social History Main Topics  . Smoking status: Never Smoker  . Smokeless tobacco: Never Used  . Alcohol use No  .  Drug use: No  . Sexual activity: Not on file   Other Topics Concern  . Not on file   Social History Narrative   Patient lives at home alone.   Caffeine Use: 1/2 cup of coffee daily      PHYSICAL EXAM  Vitals:   05/11/16 0909  BP: (!) 162/64  Pulse: 70  Resp: 14  Weight: 110 lb (49.9 kg)  Height: 5\' 2"  (1.575 m)   Body mass index is 20.12 kg/m.   MMSE - Mini Mental State Exam 05/11/2016  Orientation to time 4  Orientation to Place 3  Registration 3  Attention/ Calculation 5  Recall 0  Language- name 2 objects 2  Language- repeat 1  Language- follow 3 step command 3  Language- read & follow direction 1  Write a sentence 1  Copy design 1  Total score 24     Generalized: Well developed, in no acute distress   Neurological examination  Mentation: Alert oriented to time, place, history taking. Follows all commands  speech and language fluent Cranial nerve II-XII: Pupils were equal round reactive to light. Extraocular movements were full, visual field were full on confrontational test. Facial sensation and strength were normal. Uvula tongue midline. Head turning and shoulder shrug  were normal and symmetric. Motor: The motor testing reveals 5 over 5 strength of all 4 extremities. Good symmetric motor tone is noted throughout.  Sensory: Sensory testing is intact to soft touch on all 4 extremities. No evidence of extinction is noted.  Coordination: Cerebellar testing reveals good finger-nose-finger and heel-to-shin bilaterally.  Gait and station: Gait is slightly unsteady. Unable to do tandem gait. Romberg is negative but unsteady. Reflexes: Deep tendon reflexes are symmetric and normal bilaterally.   DIAGNOSTIC DATA (LABS, IMAGING, TESTING) - I reviewed patient records, labs, notes, testing and imaging myself where available.  Lab Results  Component Value Date   WBC 7.2 08/03/2015   HGB 12.3 08/03/2015   HCT 37.8 08/03/2015   MCV 95.9 08/03/2015   PLT 206  08/03/2015      Component Value Date/Time   NA 137 08/03/2015 2110   K 3.8 08/03/2015 2110   CL 103 08/03/2015 2110   CO2 28 08/03/2015 2110   GLUCOSE 98 08/03/2015 2110   BUN 16 08/03/2015 2110   CREATININE 0.75 08/03/2015 2110   CALCIUM 9.0 08/03/2015 2110   GFRNONAA >60 08/03/2015 2110   GFRAA >60 08/03/2015 2110    Lab Results  Component Value Date   VITAMINB12 315 01/25/2015   Lab Results  Component Value Date   TSH 1.200 01/25/2015      ASSESSMENT AND PLAN 83 y.o. year old female  has a past medical history of High cholesterol; Hypertension; and Osteopenia. here with:  1. Memory disturbance 2. Abnormality of gait  The patient's memory score has slightly declined. She will continue on Aricept. I will start Namenda 5 mg twice a day. I have reviewed the side effects of Namenda with the patient and her daughter. I also provided her with a handout. Patient is encouraged to use a walker at all times. Also advised that she may need more supervision in the home if her memory continues to decline. Patient is advised that if her symptoms worsen or she develops new symptoms they should let us know. She will follow-up in 6 months with Dr. Jaynee Eagles.     Ward Givens, MSN, NP-C 05/11/2016, 9:30 AM [facility] [address], [place], [place] [address] [phone]

## 2016-05-15 DIAGNOSIS — S4991XA Unspecified injury of right shoulder and upper arm, initial encounter: Secondary | ICD-10-CM | POA: Diagnosis not present

## 2016-05-15 DIAGNOSIS — S51811A Laceration without foreign body of right forearm, initial encounter: Secondary | ICD-10-CM | POA: Diagnosis not present

## 2016-05-17 DIAGNOSIS — S51811D Laceration without foreign body of right forearm, subsequent encounter: Secondary | ICD-10-CM | POA: Diagnosis not present

## 2016-05-23 DIAGNOSIS — S61411A Laceration without foreign body of right hand, initial encounter: Secondary | ICD-10-CM | POA: Diagnosis not present

## 2016-05-25 ENCOUNTER — Ambulatory Visit (INDEPENDENT_AMBULATORY_CARE_PROVIDER_SITE_OTHER): Payer: Medicare HMO

## 2016-05-25 ENCOUNTER — Ambulatory Visit (INDEPENDENT_AMBULATORY_CARE_PROVIDER_SITE_OTHER): Payer: Medicare HMO | Admitting: Orthopaedic Surgery

## 2016-05-25 DIAGNOSIS — M5416 Radiculopathy, lumbar region: Secondary | ICD-10-CM

## 2016-05-25 MED ORDER — BACLOFEN 10 MG PO TABS: 10.0000 mg | ORAL_TABLET | Freq: Three times a day (TID) | ORAL | 2 refills | Status: DC | PRN

## 2016-05-25 MED ORDER — DICLOFENAC SODIUM 75 MG PO TBEC: 75.0000 mg | DELAYED_RELEASE_TABLET | Freq: Two times a day (BID) | ORAL | 2 refills | Status: DC

## 2016-05-25 NOTE — Addendum Note (Signed)
Addended by: Azucena Cecil on: 05/25/2016 10:06 AM   Modules accepted: Orders

## 2016-05-25 NOTE — Progress Notes (Signed)
Office Visit Note   Patient: Julie Martinez           Date of Birth: 01/10/1932           MRN: [id number] Visit Date: 05/25/2016              Requested by: Leighton Ruff, MD [place], [place] [address] PCP: Leighton Ruff, MD   Assessment & Plan: Visit Diagnoses:  1. Pain in left leg   2. Pain in left hip     Plan: Impression is lumbar radiculopathy. Her x-ray show multilevel spondylosis. She has an old L4 compression fracture. Her hips look fine on x-ray. MRI of the lumbar spine ordered today. I will contact them about the results and likely send her to Dr. Ernestina Patches for injection if appropriate. I prescribed diclofenac and baclofen for her.  Follow-Up Instructions: Return if symptoms worsen or fail to improve.   Orders:  Orders Placed This Encounter  Procedures  . XR Lumbar Spine 2-3 Views   Meds ordered this encounter  Medications  . diclofenac (VOLTAREN) 75 MG EC tablet    Sig: Take 1 tablet (75 mg total) by mouth 2 (two) times daily.    Dispense:  30 tablet    Refill:  2  . baclofen (LIORESAL) 10 MG tablet    Sig: Take 1 tablet (10 mg total) by mouth 3 (three) times daily as needed for muscle spasms.    Dispense:  30 each    Refill:  2      Procedures: No procedures performed   Clinical Data: No additional findings.   Subjective: Chief Complaint  Patient presents with  . Left Leg - Pain  . Left Hip - Pain    Patient is a 83 year old female with worsening dementia who comes in complaining of chronic left leg radiculopathy. History of present illness details are unclear. She mainly complains of leg pain more than her back pain. She has significant pain in the morning and at night. Denies any injuries. She has been taking Tylenol arthritis without much relief. She is referred here by her PCP today.    Review of Systems  Constitutional: Negative.   HENT: Negative.   Eyes: Negative.   Respiratory: Negative.   Cardiovascular:  Negative.   Endocrine: Negative.   Musculoskeletal: Negative.   Neurological: Negative.   Hematological: Negative.   Psychiatric/Behavioral: Negative.   All other systems reviewed and are negative.    Objective: Vital Signs: There were no vitals taken for this visit.  Physical Exam  Constitutional: She is oriented to person, place, and time. She appears well-developed and well-nourished.  HENT:  Head: Normocephalic and atraumatic.  Eyes: EOM are normal.  Neck: Neck supple.  Pulmonary/Chest: Effort normal.  Abdominal: Soft.  Neurological: She is alert and oriented to person, place, and time.  Skin: Skin is warm. Capillary refill takes less than 2 seconds.  Psychiatric: She has a normal mood and affect. Her behavior is normal. Judgment and thought content normal.  Nursing note and vitals reviewed.   Ortho Exam Left lower extremity exam shows painless range of motion of the left hip. Negative sciatic tension signs. Lateral hip is nontender. Lower back is mildly tender. Specialty Comments:  No specialty comments available.  Imaging: No results found.   PMFS History: Patient Active Problem List   Diagnosis Date Noted  . Complaints of memory disturbance 01/28/2015  . Depression 01/28/2015   Past Medical History:  Diagnosis Date  .  High cholesterol   . Hypertension   . Osteopenia     Family History  Problem Relation Age of Onset  . Heart failure Mother     Past Surgical History:  Procedure Laterality Date  . ABDOMINAL HYSTERECTOMY    . EYE SURGERY     Social History   Occupational History  . n/a    Social History Main Topics  . Smoking status: Never Smoker  . Smokeless tobacco: Never Used  . Alcohol use No  . Drug use: No  . Sexual activity: Not on file

## 2016-05-25 NOTE — Addendum Note (Signed)
Addended by: Azucena Cecil on: 05/25/2016 10:05 AM   Modules accepted: Orders

## 2016-05-25 NOTE — Addendum Note (Signed)
Addended by: Precious Bard on: 05/25/2016 10:02 AM   Modules accepted: Orders

## 2016-05-28 NOTE — Progress Notes (Signed)
Personally  participated in, made any corrections needed, and agree with history, physical, neuro exam,assessment and plan as stated above.    Antonia Ahern, MD [facility] 

## 2016-06-17 ENCOUNTER — Ambulatory Visit
Admission: RE | Admit: 2016-06-17 | Discharge: 2016-06-17 | Disposition: A | Discharge status: Home / Self Care | Payer: Commercial Managed Care - HMO | Source: Ambulatory Visit | Attending: Orthopaedic Surgery | Admitting: Orthopaedic Surgery

## 2016-06-17 DIAGNOSIS — M48061 Spinal stenosis, lumbar region without neurogenic claudication: Secondary | ICD-10-CM | POA: Diagnosis not present

## 2016-06-17 DIAGNOSIS — M5416 Radiculopathy, lumbar region: Secondary | ICD-10-CM

## 2016-07-21 ENCOUNTER — Other Ambulatory Visit (INDEPENDENT_AMBULATORY_CARE_PROVIDER_SITE_OTHER): Payer: Self-pay | Admitting: Orthopaedic Surgery

## 2016-07-21 NOTE — Telephone Encounter (Signed)
Refill

## 2016-07-21 NOTE — Telephone Encounter (Signed)
approve

## 2016-08-30 ENCOUNTER — Other Ambulatory Visit (INDEPENDENT_AMBULATORY_CARE_PROVIDER_SITE_OTHER): Payer: Self-pay | Admitting: Orthopaedic Surgery

## 2016-09-27 ENCOUNTER — Other Ambulatory Visit (INDEPENDENT_AMBULATORY_CARE_PROVIDER_SITE_OTHER): Payer: Self-pay | Admitting: Orthopaedic Surgery

## 2016-11-09 ENCOUNTER — Ambulatory Visit: Payer: Self-pay | Admitting: Neurology

## 2016-11-14 ENCOUNTER — Other Ambulatory Visit: Payer: Self-pay | Admitting: Adult Health

## 2016-12-06 ENCOUNTER — Other Ambulatory Visit (INDEPENDENT_AMBULATORY_CARE_PROVIDER_SITE_OTHER): Payer: Self-pay | Admitting: Orthopaedic Surgery

## 2017-01-04 ENCOUNTER — Encounter: Payer: Self-pay | Admitting: Neurology

## 2017-01-04 ENCOUNTER — Ambulatory Visit: Payer: Medicare HMO | Admitting: Neurology

## 2017-01-04 VITALS — BP 169/68 | HR 74

## 2017-01-04 DIAGNOSIS — F0390 Unspecified dementia without behavioral disturbance: Secondary | ICD-10-CM | POA: Diagnosis not present

## 2017-01-04 DIAGNOSIS — W19XXXA Unspecified fall, initial encounter: Secondary | ICD-10-CM | POA: Insufficient documentation

## 2017-01-04 DIAGNOSIS — R269 Unspecified abnormalities of gait and mobility: Secondary | ICD-10-CM

## 2017-01-04 DIAGNOSIS — F320 Major depressive disorder, single episode, mild: Secondary | ICD-10-CM

## 2017-01-04 MED ORDER — DONEPEZIL HCL 10 MG PO TABS
10.0000 mg | ORAL_TABLET | Freq: Every day | ORAL | 11 refills | Status: DC
Start: 2017-01-04 — End: 2018-02-22

## 2017-01-04 MED ORDER — MEMANTINE HCL 10 MG PO TABS: 10.0000 mg | ORAL_TABLET | Freq: Two times a day (BID) | ORAL | 12 refills | Status: DC

## 2017-01-04 NOTE — Patient Instructions (Addendum)
Increase Memantine (Namenda) to 10mg  twice daily Physical therapy to the house   Fall Prevention in the Home Falls can cause injuries. They can happen to people of all ages. There are many things you can do to make your home safe and to help prevent falls. What can I do on the outside of my home?  Regularly fix the edges of walkways and driveways and fix any cracks.  Remove anything that might make you trip as you walk through a door, such as a raised step or threshold.  Trim any bushes or trees on the path to your home.  Use bright outdoor lighting.  Clear any walking paths of anything that might make someone trip, such as rocks or tools.  Regularly check to see if handrails are loose or broken. Make sure that both sides of any steps have handrails.  Any raised decks and porches should have guardrails on the edges.  Have any leaves, snow, or ice cleared regularly.  Use sand or salt on walking paths during winter.  Clean up any spills in your garage right away. This includes oil or grease spills. What can I do in the bathroom?  Use night lights.  Install grab bars by the toilet and in the tub and shower. Do not use towel bars as grab bars.  Use non-skid mats or decals in the tub or shower.  If you need to sit down in the shower, use a plastic, non-slip stool.  Keep the floor dry. Clean up any water that spills on the floor as soon as it happens.  Remove soap buildup in the tub or shower regularly.  Attach bath mats securely with double-sided non-slip rug tape.  Do not have throw rugs and other things on the floor that can make you trip. What can I do in the bedroom?  Use night lights.  Make sure that you have a light by your bed that is easy to reach.  Do not use any sheets or blankets that are too big for your bed. They should not hang down onto the floor.  Have a firm chair that has side arms. You can use this for support while you get dressed.  Do not have  throw rugs and other things on the floor that can make you trip. What can I do in the kitchen?  Clean up any spills right away.  Avoid walking on wet floors.  Keep items that you use a lot in easy-to-reach places.  If you need to reach something above you, use a strong step stool that has a grab bar.  Keep electrical cords out of the way.  Do not use floor polish or wax that makes floors slippery. If you must use wax, use non-skid floor wax.  Do not have throw rugs and other things on the floor that can make you trip. What can I do with my stairs?  Do not leave any items on the stairs.  Make sure that there are handrails on both sides of the stairs and use them. Fix handrails that are broken or loose. Make sure that handrails are as long as the stairways.  Check any carpeting to make sure that it is firmly attached to the stairs. Fix any carpet that is loose or worn.  Avoid having throw rugs at the top or bottom of the stairs. If you do have throw rugs, attach them to the floor with carpet tape.  Make sure that you have a light switch at  the top of the stairs and the bottom of the stairs. If you do not have them, ask someone to add them for you. What else can I do to help prevent falls?  Wear shoes that: ? Do not have high heels. ? Have rubber bottoms. ? Are comfortable and fit you well. ? Are closed at the toe. Do not wear sandals.  If you use a stepladder: ? Make sure that it is fully opened. Do not climb a closed stepladder. ? Make sure that both sides of the stepladder are locked into place. ? Ask someone to hold it for you, if possible.  Clearly mark and make sure that you can see: ? Any grab bars or handrails. ? First and last steps. ? Where the edge of each step is.  Use tools that help you move around (mobility aids) if they are needed. These include: ? Canes. ? Walkers. ? Scooters. ? Crutches.  Turn on the lights when you go into a dark area. Replace any  light bulbs as soon as they burn out.  Set up your furniture so you have a clear path. Avoid moving your furniture around.  If any of your floors are uneven, fix them.  If there are any pets around you, be aware of where they are.  Review your medicines with your doctor. Some medicines can make you feel dizzy. This can increase your chance of falling. Ask your doctor what other things that you can do to help prevent falls. This information is not intended to replace advice given to you by your health care provider. Make sure you discuss any questions you have with your health care provider. Document Released: 11/01/2008 Document Revised: 06/13/2015 Document Reviewed: 02/09/2014 Elsevier Interactive Patient Education  [employer].

## 2017-01-04 NOTE — Progress Notes (Signed)
[facility]    Provider:  Dr Jaynee Eagles Referring Provider: Leighton Ruff, MD Primary Care Physician:  Leighton Ruff, MD  CC:  Memory problems  12/2016: She has had falls. Here with her daughter who provides most information. Unclear why, she fell, she doesn't know why but she was bruised, she has fallen a few more times.  She falls when she walks her dog she gets pulled down but refuses not to walk the dog. She misses medications. Memory is worsening. She has been having hallucinations, seeing a man in a tree behind the house. She still has depression. I recommend a memory unit for patient, daughter and patient decline. I advised 24x7 care, she has her grandchildren 24x7.   10/2015: Today October 20 13,017 Julie Martinez is an 83 year old female with a history of memory disturbance. She returns today for follow-up. She was started on Aricept and has been tolerating it well. She continues to live home alone. She is able to complete all ADLs independently. She doesoperate a motor vehicle but only drives short distances. Her daughter is with her today and reports that she has turned on the wrong roads when driving to familiar places. This does not happen often. She does have a hard time sleeping. She states that she is often thinking of her deceased son. He passed away in Aug 04, 2014 from [place] disease. She does feel that she suffers from depression. Her primary care recently increase Zoloft to 3 times a day. Patient returns today for an evaluation.    HPI:  Julie Martinez is a 83 y.o. female here as a referral from Dr. Drema Dallas for memory problems. Past medical history of hypertension, anxiety and depression. Here with daughter who provides most information. Memory problems at least the last year. Has been worse since 2022/08/04 when her son passed away from [place]. Daughter noticed she forgot a funeral she went to of daughter's brother in law in November. Son was living his mother  for many years since the late nighties. They noticed more memory problems since he died. Husband passed away in 1997/98. She has been depressed since her son died. She says his dog died recently (daughter says the dog died many years ago). Patient pays the bills and she has paid all of them. No missing bills, she owns the house, all the utilities are paid. Daughter reminds mother and takes her to all appointments. Daughter goes over every day. No accidents in the home. Patient is alone a lot and she is depressed. atient repeats herself a lot, that has been going on for the last year. She doesn't remember telling daughter many things. Brother with dementia.    Reviewed notes, labs and imaging from outside physicians, which showed:   Reviewed notes from [facility]. Patient has had problems with memory, the Mini-Mental Status exam done on 1024 had a score of 25 out of 30. Patient has also had a number of losses recently her son died of ALS, her dog died and recently a son-in-law die. Patient had forgotten that the son-in-law died. Patient has a history of hypertension, uncertain if she is actually taking. She was prescribed in September 90 day prescription. She has not been checking her blood pressures at home. Diagnosed with memory problems and essential hypertension, mixed hyperlipidemia, anxiety and depression, dizziness. She was seen at the emergency room for dizziness and she was giving meclizine, the dizziness improved. 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 She is on sertraline and that dose was increased recently. She had reported she wasn't sleeping well. Notes noted that she had a sad mood and affect.  Personally reviewed images of the brain, MRI w/o 10/2013:  1. No acute intracranial abnormality. 2. 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  Social  History   Socioeconomic History  . Marital status: Widowed    Spouse name: Not on file  . Number of children: 3  . Years of education: 33  . Highest education level: High school graduate  Social Needs  . Financial resource strain: Not on file  . Food insecurity - worry: Not on file  . Food insecurity - inability: Not on file  . Transportation needs - medical: Not on file  . Transportation needs - non-medical: Not on file  Occupational History  . Occupation: n/a  Tobacco Use  . Smoking status: Never Smoker  . Smokeless tobacco: Never Used  Substance and Sexual Activity  . Alcohol use: No  . Drug use: No  . Sexual activity: Not on file  Other Topics Concern  . Not on file  Social History Narrative   Patient lives at home alone. There is always a family member there 24/7.   Caffeine Use: 1 cup of coffee daily   Right handed    Family History  Problem Relation Age of Onset  . Heart failure Mother     Past Medical History:  Diagnosis Date  . High cholesterol   . Hypertension   . Osteopenia     Past Surgical History:  Procedure Laterality Date  . ABDOMINAL HYSTERECTOMY    . EYE SURGERY    . SHOULDER SURGERY  2017   plating for fracture    Current Outpatient Medications  Medication Sig Dispense Refill  . Acetaminophen (TYLENOL ARTHRITIS PAIN PO) Take by mouth.    Marland Kitchen alendronate (FOSAMAX) 70 MG tablet Take 70 mg by mouth once a week. Take with a full glass of water on an empty stomach.    Marland Kitchen aspirin 81 MG EC tablet Take 81 mg by mouth daily.    Marland Kitchen donepezil (ARICEPT) 10 MG tablet TAKE 1 TABLET (10 MG TOTAL) BY MOUTH AT BEDTIME. 30 tablet 11  . memantine (NAMENDA) 10 MG tablet Take 1 tablet (10 mg total) by mouth 2 (two) times daily. 60 tablet 12  . quinapril (ACCUPRIL) 20 MG tablet Take 20 mg by mouth at bedtime.    . sertraline (ZOLOFT) 25 MG tablet Take 25 mg by mouth daily.    Marland Kitchen triamterene-hydrochlorothiazide (DYAZIDE) 37.5-25 MG capsule Take 1 capsule by mouth  daily.  0   No current facility-administered medications for this visit.     Allergies as of 01/04/2017  . (No Known Allergies)    Vitals: BP (!) 169/68 (BP Location: Right Arm, Patient Position: Sitting)   Pulse 74  Last Weight:  Wt Readings from Last 1 Encounters:  05/11/16 110 lb (49.9 kg)   Last Height:   Ht Readings from Last 1 Encounters:  05/11/16 5\' 2"  (1.575 m)    MMSE - Mini Mental State Exam 01/04/2017 05/11/2016  Orientation to time 3 4  Orientation to Place 4 3  Registration 3 3  Attention/ Calculation 5 5  Recall 0 0  Language- name 2 objects 2 2  Language- repeat 1 1  Language- follow 3 step command 3 3  Language- read &  follow direction 1 1  Write a sentence 1 1  Copy design 1 1  Total score 24 24   Assessment/Plan:   An 83 year old female here for cognitive complaints with her daughter. MMSE stable.  patient has been depressed due to the death of her son from [place] whom she cared for for many years, treated with Zoloft. Discussed with patient and daughter that depression can worsen cognitive abilities and I did highly encourage them to continue seeing primary care and also find a therapist that she could speak to. Discussed formal neurocognitive testing, they decline.  - PT for falls and  Gait abnormality, home inspection  and nursing for disease process teaching and medication management - increase memantine - continue aricept - f/u 6-9 months - discussed fall prevention, use of walking aids  Orders Placed This Encounter  Procedures  . Ambulatory referral to [place], MD  [facility] [address] [place], [place] [address]  Phone [phone] Fax [phone]  A total of 15 minutes was spent face-to-face with this patient. Over half this time was spent on counseling patient on the dementia, depression, falls, gait abnormality diagnosis and different diagnostic and therapeutic options  available.

## 2017-01-06 DIAGNOSIS — F419 Anxiety disorder, unspecified: Secondary | ICD-10-CM | POA: Diagnosis not present

## 2017-01-06 DIAGNOSIS — F329 Major depressive disorder, single episode, unspecified: Secondary | ICD-10-CM | POA: Diagnosis not present

## 2017-01-06 DIAGNOSIS — I10 Essential (primary) hypertension: Secondary | ICD-10-CM | POA: Diagnosis not present

## 2017-01-06 DIAGNOSIS — F0391 Unspecified dementia with behavioral disturbance: Secondary | ICD-10-CM | POA: Diagnosis not present

## 2017-01-06 DIAGNOSIS — E7800 Pure hypercholesterolemia, unspecified: Secondary | ICD-10-CM | POA: Diagnosis not present

## 2017-01-06 DIAGNOSIS — M8500 Fibrous dysplasia (monostotic), unspecified site: Secondary | ICD-10-CM | POA: Diagnosis not present

## 2017-01-14 DIAGNOSIS — F0391 Unspecified dementia with behavioral disturbance: Secondary | ICD-10-CM | POA: Diagnosis not present

## 2017-01-14 DIAGNOSIS — E7800 Pure hypercholesterolemia, unspecified: Secondary | ICD-10-CM | POA: Diagnosis not present

## 2017-01-14 DIAGNOSIS — M8500 Fibrous dysplasia (monostotic), unspecified site: Secondary | ICD-10-CM | POA: Diagnosis not present

## 2017-01-14 DIAGNOSIS — F329 Major depressive disorder, single episode, unspecified: Secondary | ICD-10-CM | POA: Diagnosis not present

## 2017-01-14 DIAGNOSIS — I10 Essential (primary) hypertension: Secondary | ICD-10-CM | POA: Diagnosis not present

## 2017-01-14 DIAGNOSIS — F419 Anxiety disorder, unspecified: Secondary | ICD-10-CM | POA: Diagnosis not present

## 2017-01-20 DIAGNOSIS — F329 Major depressive disorder, single episode, unspecified: Secondary | ICD-10-CM | POA: Diagnosis not present

## 2017-01-20 DIAGNOSIS — I10 Essential (primary) hypertension: Secondary | ICD-10-CM | POA: Diagnosis not present

## 2017-01-20 DIAGNOSIS — M8500 Fibrous dysplasia (monostotic), unspecified site: Secondary | ICD-10-CM | POA: Diagnosis not present

## 2017-01-20 DIAGNOSIS — F0391 Unspecified dementia with behavioral disturbance: Secondary | ICD-10-CM | POA: Diagnosis not present

## 2017-01-20 DIAGNOSIS — E7800 Pure hypercholesterolemia, unspecified: Secondary | ICD-10-CM | POA: Diagnosis not present

## 2017-01-20 DIAGNOSIS — F419 Anxiety disorder, unspecified: Secondary | ICD-10-CM | POA: Diagnosis not present

## 2017-01-21 DIAGNOSIS — M8500 Fibrous dysplasia (monostotic), unspecified site: Secondary | ICD-10-CM | POA: Diagnosis not present

## 2017-01-21 DIAGNOSIS — F0391 Unspecified dementia with behavioral disturbance: Secondary | ICD-10-CM | POA: Diagnosis not present

## 2017-01-21 DIAGNOSIS — F419 Anxiety disorder, unspecified: Secondary | ICD-10-CM | POA: Diagnosis not present

## 2017-01-21 DIAGNOSIS — E7800 Pure hypercholesterolemia, unspecified: Secondary | ICD-10-CM | POA: Diagnosis not present

## 2017-01-21 DIAGNOSIS — I10 Essential (primary) hypertension: Secondary | ICD-10-CM | POA: Diagnosis not present

## 2017-01-21 DIAGNOSIS — F329 Major depressive disorder, single episode, unspecified: Secondary | ICD-10-CM | POA: Diagnosis not present

## 2017-01-22 ENCOUNTER — Telehealth: Payer: Self-pay | Admitting: *Deleted

## 2017-01-22 DIAGNOSIS — F329 Major depressive disorder, single episode, unspecified: Secondary | ICD-10-CM | POA: Diagnosis not present

## 2017-01-22 DIAGNOSIS — F0391 Unspecified dementia with behavioral disturbance: Secondary | ICD-10-CM | POA: Diagnosis not present

## 2017-01-22 DIAGNOSIS — F419 Anxiety disorder, unspecified: Secondary | ICD-10-CM | POA: Diagnosis not present

## 2017-01-22 DIAGNOSIS — I10 Essential (primary) hypertension: Secondary | ICD-10-CM | POA: Diagnosis not present

## 2017-01-22 DIAGNOSIS — E7800 Pure hypercholesterolemia, unspecified: Secondary | ICD-10-CM | POA: Diagnosis not present

## 2017-01-22 DIAGNOSIS — M8500 Fibrous dysplasia (monostotic), unspecified site: Secondary | ICD-10-CM | POA: Diagnosis not present

## 2017-01-22 NOTE — Telephone Encounter (Signed)
Faxed signed PT Plan of Care paperwork to [facility]. Received a receipt of confirmation.

## 2017-01-27 DIAGNOSIS — F329 Major depressive disorder, single episode, unspecified: Secondary | ICD-10-CM | POA: Diagnosis not present

## 2017-01-27 DIAGNOSIS — I10 Essential (primary) hypertension: Secondary | ICD-10-CM | POA: Diagnosis not present

## 2017-01-27 DIAGNOSIS — F419 Anxiety disorder, unspecified: Secondary | ICD-10-CM | POA: Diagnosis not present

## 2017-01-27 DIAGNOSIS — F0391 Unspecified dementia with behavioral disturbance: Secondary | ICD-10-CM | POA: Diagnosis not present

## 2017-01-27 DIAGNOSIS — M8500 Fibrous dysplasia (monostotic), unspecified site: Secondary | ICD-10-CM | POA: Diagnosis not present

## 2017-01-27 DIAGNOSIS — E7800 Pure hypercholesterolemia, unspecified: Secondary | ICD-10-CM | POA: Diagnosis not present

## 2017-01-29 DIAGNOSIS — F329 Major depressive disorder, single episode, unspecified: Secondary | ICD-10-CM | POA: Diagnosis not present

## 2017-01-29 DIAGNOSIS — F419 Anxiety disorder, unspecified: Secondary | ICD-10-CM | POA: Diagnosis not present

## 2017-01-29 DIAGNOSIS — E7800 Pure hypercholesterolemia, unspecified: Secondary | ICD-10-CM | POA: Diagnosis not present

## 2017-01-29 DIAGNOSIS — F0391 Unspecified dementia with behavioral disturbance: Secondary | ICD-10-CM | POA: Diagnosis not present

## 2017-01-29 DIAGNOSIS — M8500 Fibrous dysplasia (monostotic), unspecified site: Secondary | ICD-10-CM | POA: Diagnosis not present

## 2017-01-29 DIAGNOSIS — I10 Essential (primary) hypertension: Secondary | ICD-10-CM | POA: Diagnosis not present

## 2017-03-08 ENCOUNTER — Other Ambulatory Visit: Payer: Self-pay

## 2017-03-08 ENCOUNTER — Emergency Department (HOSPITAL_BASED_OUTPATIENT_CLINIC_OR_DEPARTMENT_OTHER): Payer: Medicare HMO

## 2017-03-08 ENCOUNTER — Encounter (HOSPITAL_BASED_OUTPATIENT_CLINIC_OR_DEPARTMENT_OTHER): Payer: Self-pay | Admitting: Emergency Medicine

## 2017-03-08 ENCOUNTER — Emergency Department (HOSPITAL_BASED_OUTPATIENT_CLINIC_OR_DEPARTMENT_OTHER)
Admission: EM | Admit: 2017-03-08 | Discharge: 2017-03-08 | Disposition: A | Discharge status: Home / Self Care | Payer: Medicare HMO | Attending: Physician Assistant | Admitting: Physician Assistant

## 2017-03-08 DIAGNOSIS — R531 Weakness: Secondary | ICD-10-CM | POA: Insufficient documentation

## 2017-03-08 DIAGNOSIS — Z79899 Other long term (current) drug therapy: Secondary | ICD-10-CM | POA: Diagnosis not present

## 2017-03-08 DIAGNOSIS — Z7982 Long term (current) use of aspirin: Secondary | ICD-10-CM | POA: Diagnosis not present

## 2017-03-08 DIAGNOSIS — I10 Essential (primary) hypertension: Secondary | ICD-10-CM | POA: Insufficient documentation

## 2017-03-08 DIAGNOSIS — F0390 Unspecified dementia without behavioral disturbance: Secondary | ICD-10-CM | POA: Insufficient documentation

## 2017-03-08 LAB — COMPREHENSIVE METABOLIC PANEL
ALBUMIN: 3.6 g/dL (ref 3.5–5.0)
ALK PHOS: 62 U/L (ref 38–126)
ALT: 13 U/L — ABNORMAL LOW (ref 14–54)
AST: 22 U/L (ref 15–41)
Anion gap: 8 (ref 5–15)
BILIRUBIN TOTAL: 0.5 mg/dL (ref 0.3–1.2)
BUN: 21 mg/dL — AB (ref 6–20)
CALCIUM: 9.6 mg/dL (ref 8.9–10.3)
CO2: 28 mmol/L (ref 22–32)
Chloride: 106 mmol/L (ref 101–111)
Creatinine, Ser: 1.01 mg/dL — ABNORMAL HIGH (ref 0.44–1.00)
GFR calc Af Amer: 58 mL/min — ABNORMAL LOW (ref >60)
GFR calc non Af Amer: 50 mL/min — ABNORMAL LOW (ref >60)
GLUCOSE: 96 mg/dL (ref 65–99)
Potassium: 4.3 mmol/L (ref 3.5–5.1)
Sodium: 142 mmol/L (ref 135–145)
TOTAL PROTEIN: 7.1 g/dL (ref 6.5–8.1)

## 2017-03-08 LAB — CBC WITH DIFFERENTIAL/PLATELET
BASOS ABS: 0 10*3/uL (ref 0.0–0.1)
BASOS PCT: 0 %
EOS PCT: 2 %
Eosinophils Absolute: 0.2 10*3/uL (ref 0.0–0.7)
HEMATOCRIT: 40.8 % (ref 36.0–46.0)
Hemoglobin: 13.1 g/dL (ref 12.0–15.0)
Lymphocytes Relative: 13 %
Lymphs Abs: 1.1 10*3/uL (ref 0.7–4.0)
MCH: 32.6 pg (ref 26.0–34.0)
MCHC: 32.1 g/dL (ref 30.0–36.0)
MCV: 101.5 fL — ABNORMAL HIGH (ref 78.0–100.0)
MONO ABS: 0.8 10*3/uL (ref 0.1–1.0)
Monocytes Relative: 9 %
Neutro Abs: 6.5 10*3/uL (ref 1.7–7.7)
Neutrophils Relative %: 76 %
Platelets: 238 10*3/uL (ref 150–400)
RBC: 4.02 MIL/uL (ref 3.87–5.11)
RDW: 12.9 % (ref 11.5–15.5)
WBC: 8.7 10*3/uL (ref 4.0–10.5)

## 2017-03-08 LAB — TROPONIN I

## 2017-03-08 NOTE — ED Provider Notes (Signed)
[facility] EMERGENCY DEPARTMENT Provider Note   CSN: [id number] Arrival date & time: 03/08/17  1018     History   Chief Complaint Chief Complaint  Patient presents with  . Weakness    HPI OTILIA KAREEM is a 84 y.o. female.  HPI   84 year old female presenting with altered level of consciousness now resolved.  Patient was at home with her grandson when she got up she seemed weak, she sat on the couch.  Then she slumped over but was able to communicate during that time.  Said "I do not know what is wrong" grandson then helped her to eat breakfast and she is been feeling fine patient's daughter called PCP who sent her to the ER for further evaluation.  Patient's oriented only toward self.  But has had no complaints recently.  Patient is baseline per family.  Past Medical History:  Diagnosis Date  . High cholesterol   . Hypertension   . Osteopenia     Patient Active Problem List   Diagnosis Date Noted  . Dementia 01/04/2017  . Falls 01/04/2017  . Gait abnormality 01/04/2017  . Left lumbar radiculopathy 05/25/2016  . Complaints of memory disturbance 01/28/2015  . Depression 01/28/2015    Past Surgical History:  Procedure Laterality Date  . ABDOMINAL HYSTERECTOMY    . EYE SURGERY    . SHOULDER SURGERY  2017   plating for fracture    OB History    No data available       Home Medications    Prior to Admission medications   Medication Sig Start Date End Date Taking? Authorizing Provider  Acetaminophen (TYLENOL ARTHRITIS PAIN PO) Take by mouth.    [provider]  alendronate (FOSAMAX) 70 MG tablet Take 70 mg by mouth once a week. Take with a full glass of water on an empty stomach.    [provider]  aspirin 81 MG EC tablet Take 81 mg by mouth daily.    [provider]  donepezil (ARICEPT) 10 MG tablet Take 1 tablet (10 mg total) by mouth at bedtime. 01/04/17   Melvenia Beam, MD  memantine (NAMENDA) 10 MG tablet Take 1  tablet (10 mg total) by mouth 2 (two) times daily. 01/04/17   Melvenia Beam, MD  quinapril (ACCUPRIL) 20 MG tablet Take 20 mg by mouth at bedtime.    [provider]  sertraline (ZOLOFT) 25 MG tablet Take 25 mg by mouth daily.    [provider]  triamterene-hydrochlorothiazide (DYAZIDE) 37.5-25 MG capsule Take 1 capsule by mouth daily. 10/05/14   [provider]    Family History Family History  Problem Relation Age of Onset  . Heart failure Mother     Social History Social History   Tobacco Use  . Smoking status: Never Smoker  . Smokeless tobacco: Never Used  Substance Use Topics  . Alcohol use: No  . Drug use: No     Allergies   Patient has no known allergies.   Review of Systems Review of Systems  Unable to perform ROS: Dementia  Constitutional: Negative for activity change, fatigue and fever.  Cardiovascular: Negative for chest pain.  Gastrointestinal: Negative for abdominal pain.  All other systems reviewed and are negative.    Physical Exam Updated Vital Signs BP (!) 134/58   Pulse 79   Temp 98.5 F (36.9 C) (Oral)   Resp 17   Ht 5\' 2"  (1.575 m)   Wt 49.9 kg (110  lb)   SpO2 97%   BMI 20.12 kg/m   Physical Exam  Constitutional: She appears well-developed and well-nourished.  HENT:  Head: Normocephalic and atraumatic.  Eyes: Right eye exhibits no discharge. Left eye exhibits no discharge.  Chronic extrusion of bilateral lower eyelids.  Cardiovascular: Normal rate, regular rhythm and normal heart sounds.  No murmur heard. Pulmonary/Chest: Effort normal and breath sounds normal. She has no wheezes. She has no rales.  Abdominal: Soft. She exhibits no distension. There is no tenderness.  Neurological: She is alert. No cranial nerve deficit.  Oriented to self only.  Skin: Skin is warm and dry. She is not diaphoretic.  Nursing note and vitals reviewed.    ED Treatments / Results  Labs (all labs ordered are listed, but  only abnormal results are displayed) Labs Reviewed  COMPREHENSIVE METABOLIC PANEL  CBC WITH DIFFERENTIAL/PLATELET  TROPONIN I  URINALYSIS, ROUTINE W REFLEX MICROSCOPIC    EKG  EKG Interpretation None       Radiology No results found.  Procedures Procedures (including critical care time)  Medications Ordered in ED Medications - No data to display   Initial Impression / Assessment and Plan / ED Course  I have reviewed the triage vital signs and the nursing notes.  Pertinent labs & imaging results that were available during my care of the patient were reviewed by me and considered in my medical decision making (see chart for details).     84 year old female presenting with altered level of consciousness now resolved.  Patient was at home with her grandson when she got up she seemed weak, she sat on the couch.  Then she slumped over but was able to communicate during that time.  Said "I do not know what is wrong" grandson then helped her to eat breakfast and she is been feeling fine patient's daughter called PCP who sent her to the ER for further evaluation.  Patient's oriented only toward self.  But has had no complaints recently.  Patient is baseline per family.  11:33 AM Will do generalized workup for elderly person with resolved altered level of consciousness.  Discussed with family that most of these syncopal or presyncopal events are not actually etiology not found.   Work up negative. Patient centered discussion with daughter about utility of admission for "pre sycnope" vs discharge. Will discharge give her level of dementia, and low chance of meaningful findings during admission.   Final Clinical Impressions(s) / ED Diagnoses   Final diagnoses:  None    ED Discharge Orders    None       Macarthur Critchley, MD 03/09/17 [phone]

## 2017-03-08 NOTE — Discharge Instructions (Signed)
All of your lab work CAT scan EKG and x-ray were reassuring today.  Please follow-up with your primary care provider.

## 2017-03-08 NOTE — ED Triage Notes (Signed)
Pt's daughter sts pt had an episode of general weakness about 0730 today; sts she came out of the bathroom, sat down on the cough and fell over (was alert), and stated she did not know what was going on; pt A& O now, sitting in w/c

## 2017-04-30 DIAGNOSIS — F419 Anxiety disorder, unspecified: Secondary | ICD-10-CM | POA: Diagnosis not present

## 2017-04-30 DIAGNOSIS — E782 Mixed hyperlipidemia: Secondary | ICD-10-CM | POA: Diagnosis not present

## 2017-04-30 DIAGNOSIS — R413 Other amnesia: Secondary | ICD-10-CM | POA: Diagnosis not present

## 2017-04-30 DIAGNOSIS — I10 Essential (primary) hypertension: Secondary | ICD-10-CM | POA: Diagnosis not present

## 2017-04-30 DIAGNOSIS — R54 Age-related physical debility: Secondary | ICD-10-CM | POA: Diagnosis not present

## 2017-04-30 DIAGNOSIS — E559 Vitamin D deficiency, unspecified: Secondary | ICD-10-CM | POA: Diagnosis not present

## 2017-05-22 ENCOUNTER — Other Ambulatory Visit: Payer: Self-pay | Admitting: Adult Health

## 2017-07-05 IMAGING — DX DG HAND COMPLETE 3+V*R*
3 series · 4 of 4 positions shown · non-contrast
Comparison: None.

CLINICAL DATA: 84-year-old female with fall and right hand pain.

EXAM:
RIGHT HAND - COMPLETE 3+ VIEW

[hand pa]
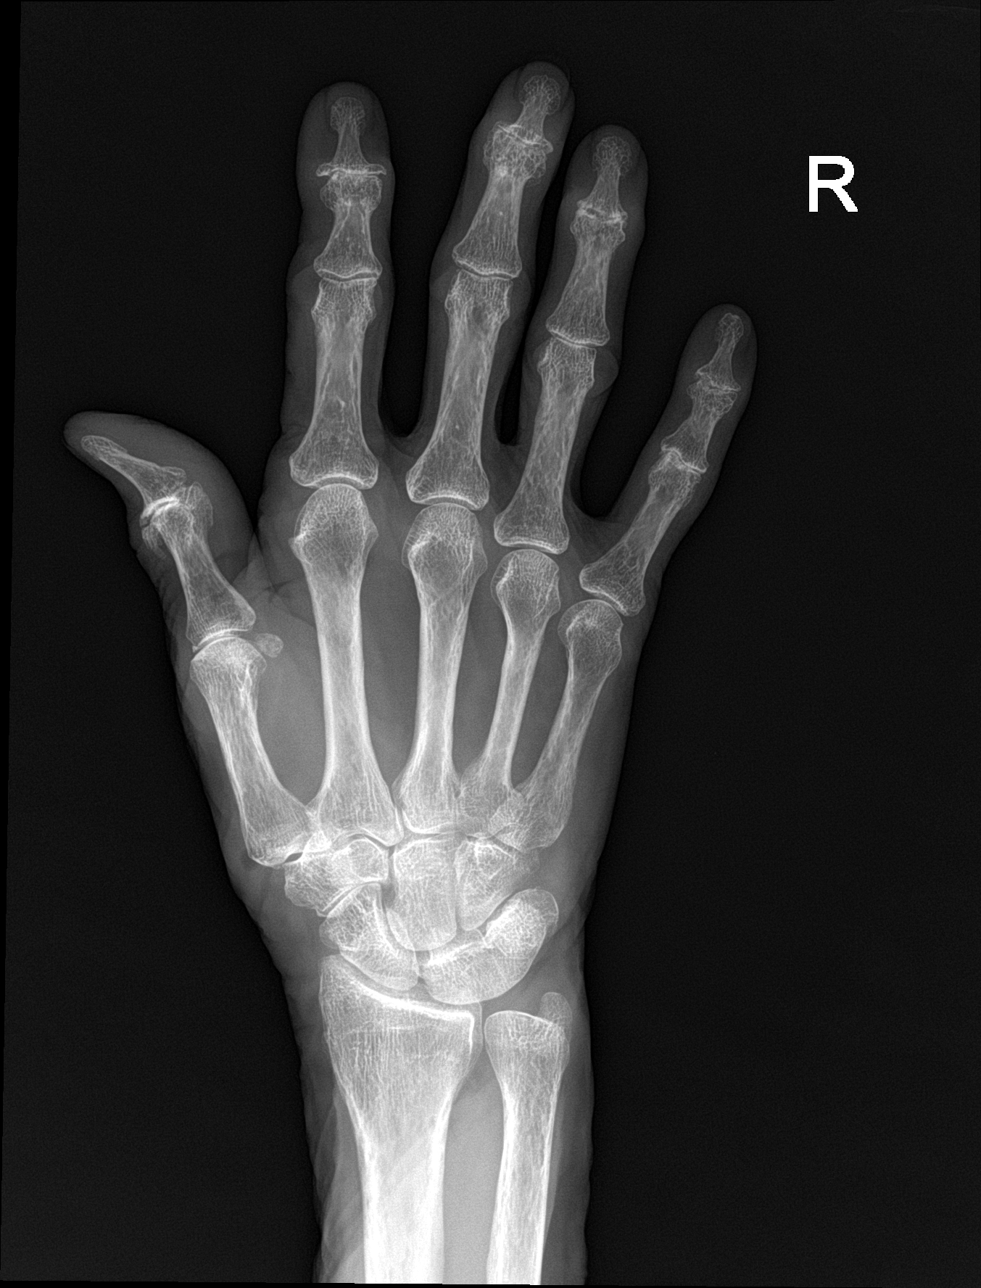

[hand obl]
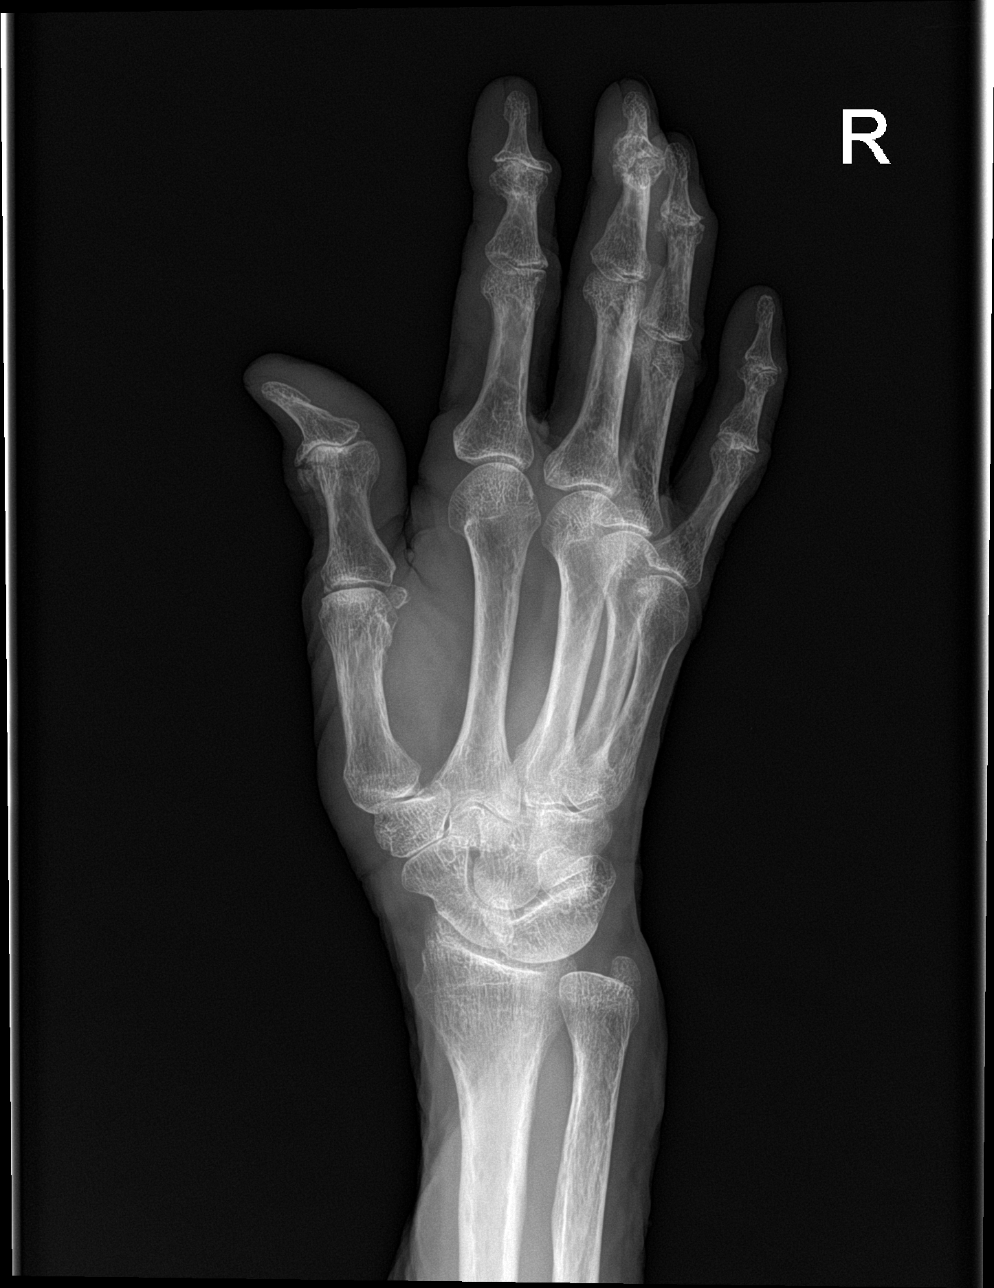

[Series 3: hand lat · 0.14mm/px · 2 of 2 slices shown]
[im 1/2]
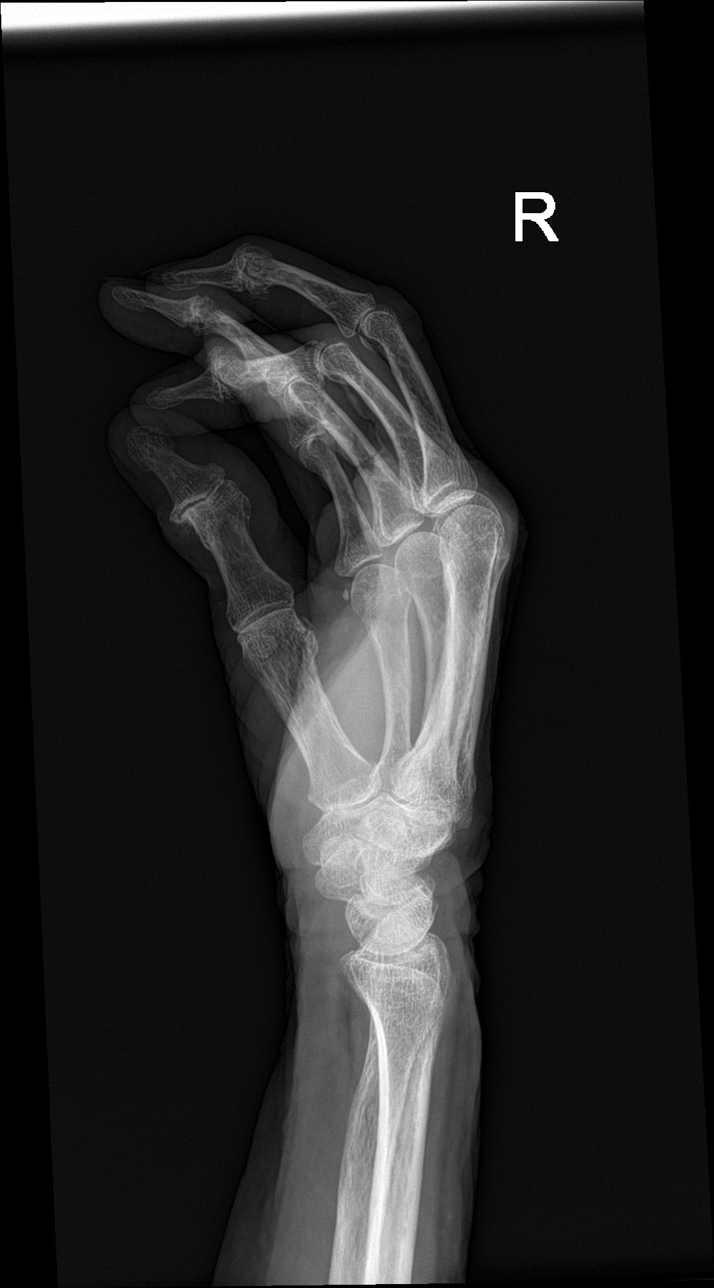
[im 2/2]
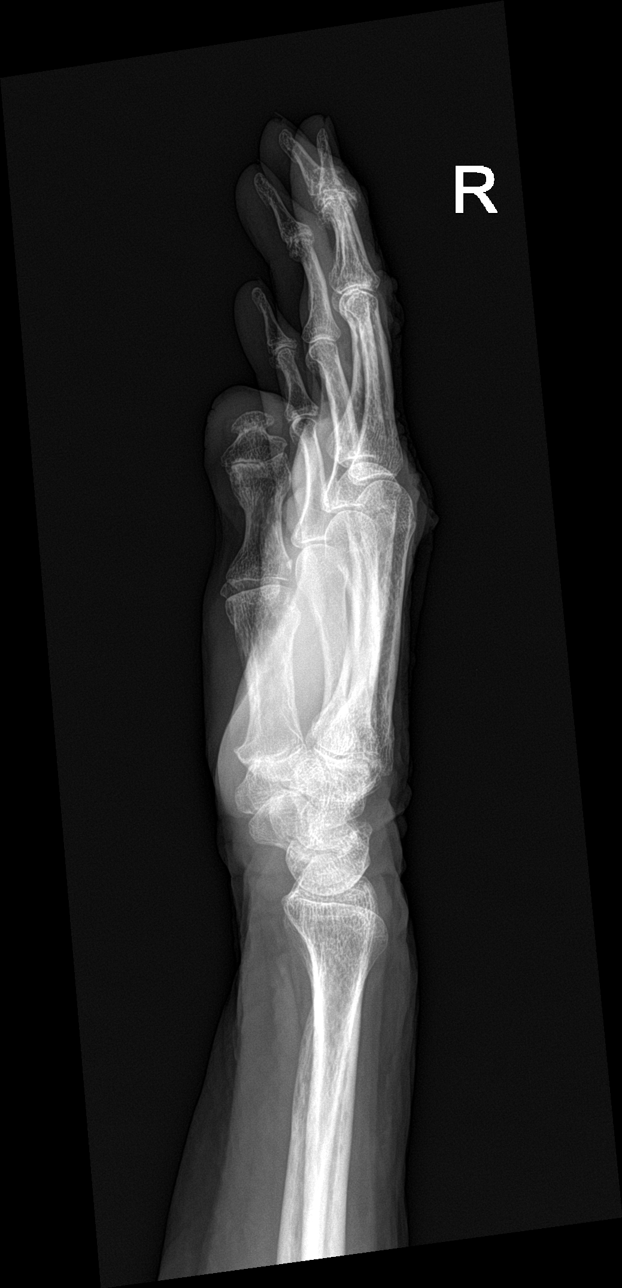

[4 of 4 positions shown; findings below may reference images not displayed]

FINDINGS: There is no acute fracture or dislocation. The bones are osteopenic.
There are osteoarthritic changes of the DIP joints. The soft tissues
appear unremarkable with no radiopaque foreign object.
IMPRESSION: No acute fracture or dislocation.

## 2017-07-05 IMAGING — CT CT CERVICAL SPINE W/O CM
3 of 4 series · 14 of 33 positions shown, 17 images · non-contrast
Comparison: Head CT dated 11/12/2014 and MRI dated 11/12/2014

CLINICAL DATA: 84-year-old female with fall

EXAM:
CT HEAD WITHOUT CONTRAST
CT MAXILLOFACIAL WITHOUT CONTRAST
CT CERVICAL SPINE WITHOUT CONTRAST
TECHNIQUE: Multidetector CT imaging of the head, cervical spine, and
maxillofacial structures were performed using the standard protocol
without intravenous contrast. Multiplanar CT image reconstructions
of the cervical spine and maxillofacial structures were also
generated.

[Series 5: sagittal bone · sagittal · 0.35mm/px · 5 of 64 slices shown, 6 images]
[im 22/64  bone]
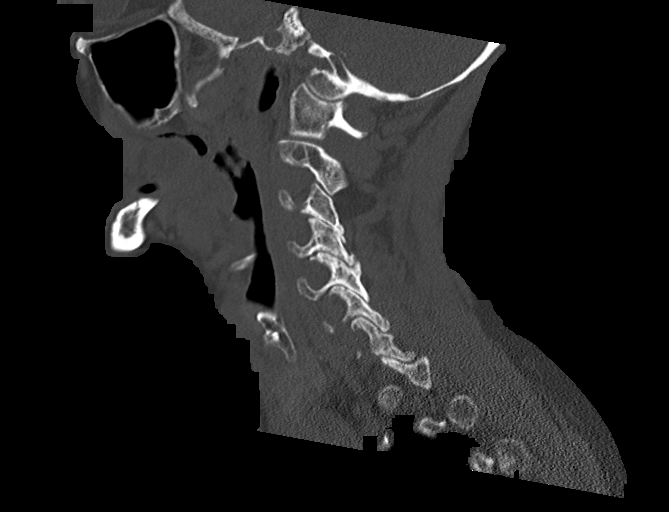
[im 27/64  bone]
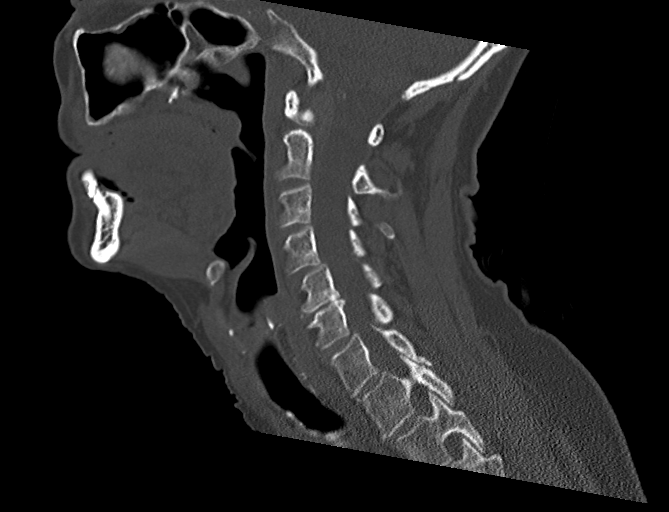
[im 32/64  soft-tissue]
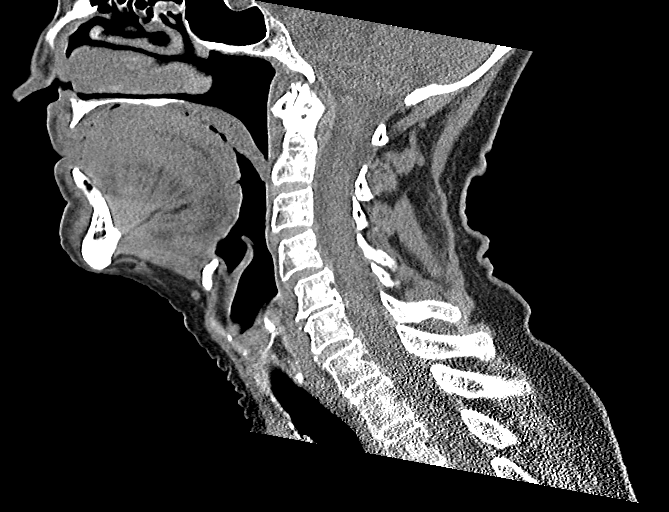
[im 32/64  bone]
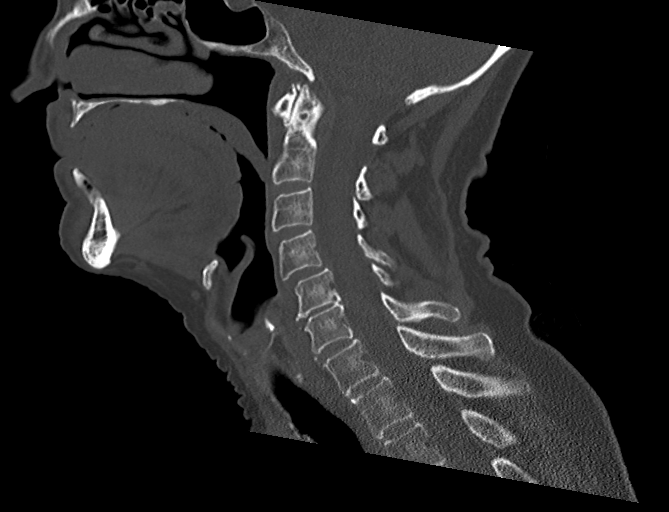
[im 37/64  bone]
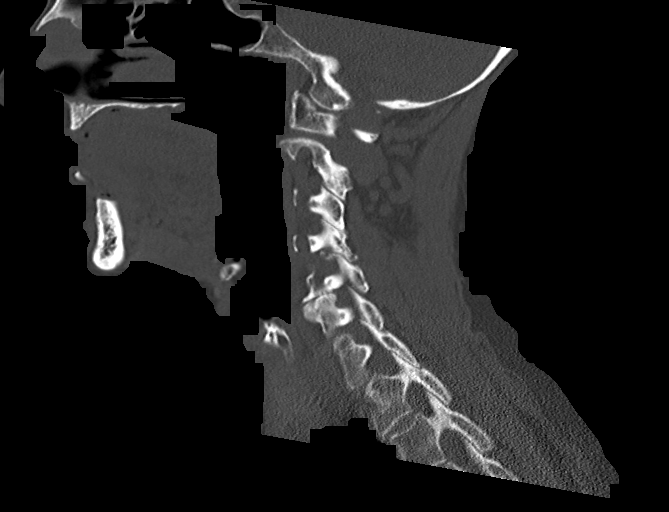
[im 43/64  bone]
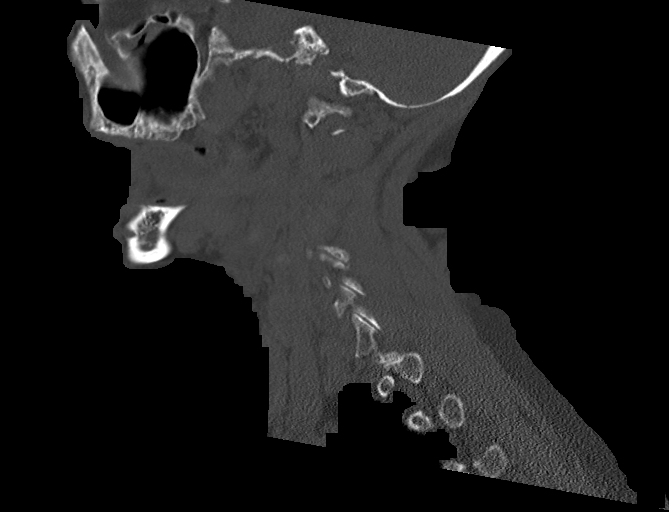

[Series 6: coronal bone · coronal · 0.40mm/px · 3 of 60 slices shown]
[im 12/60  bone]
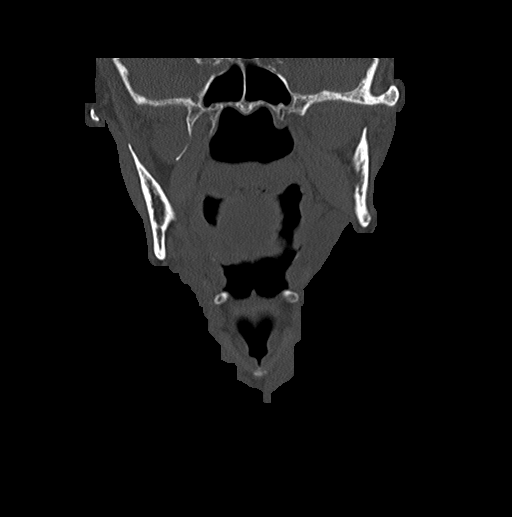
[im 24/60  bone]
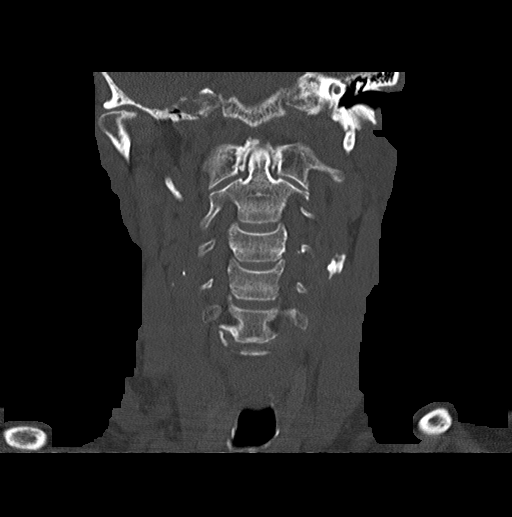
[im 36/60  bone]
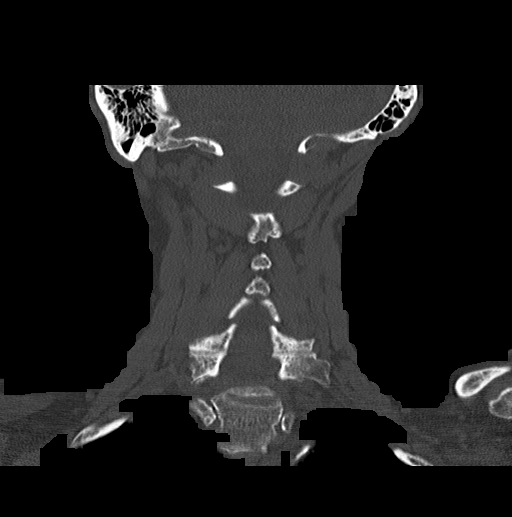

[Series 7: orthogonal bone · axial · 0.36mm/px · z∈[-356,-229]mm · 6 of 101 slices shown, 8 images]
[im 15/101  soft-tissue]
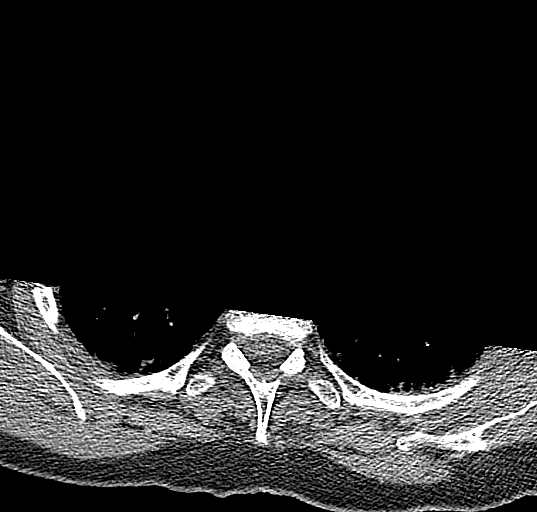
[im 15/101  bone]
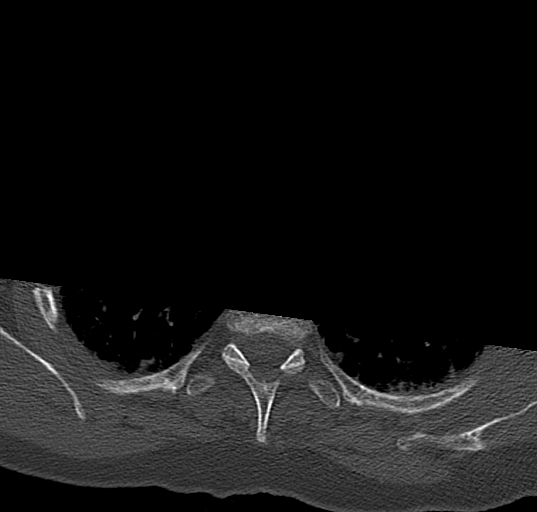
[im 29/101  bone]
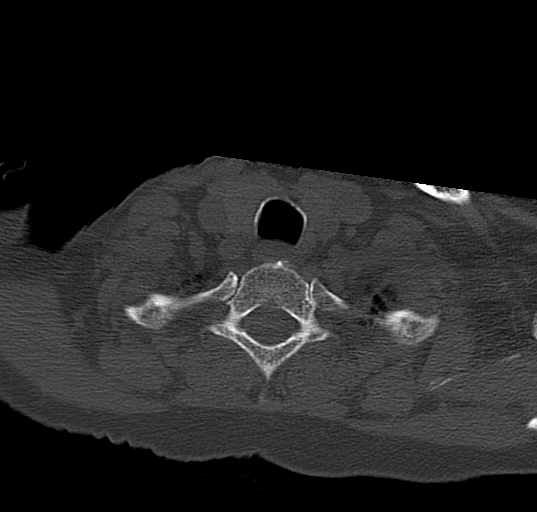
[im 43/101  bone]
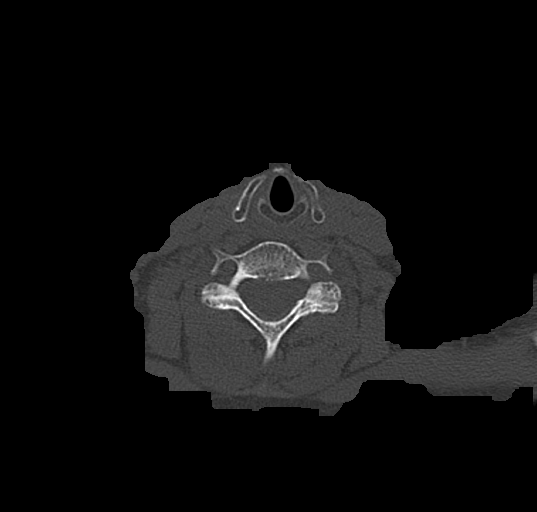
[im 58/101  bone]
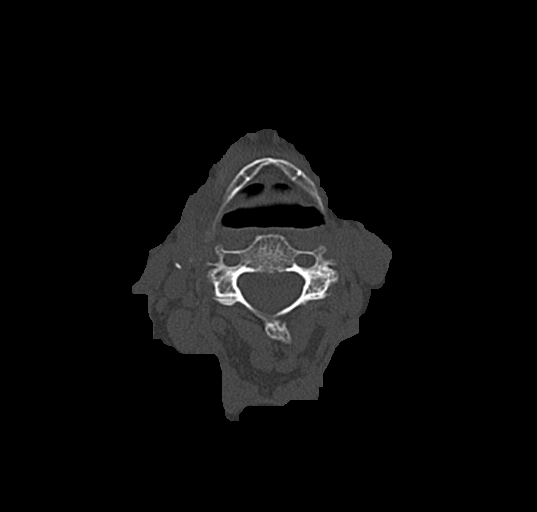
[im 72/101  soft-tissue]
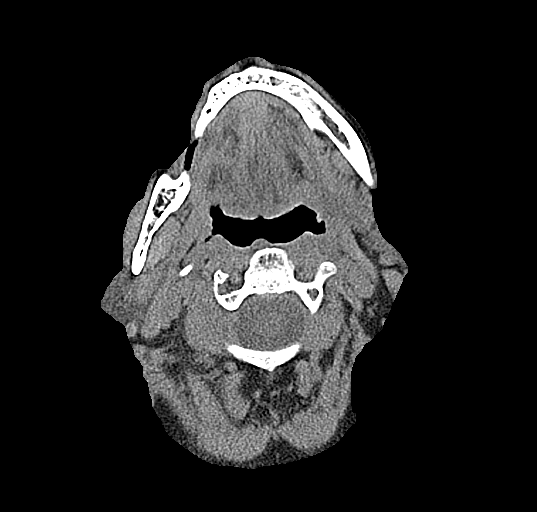
[im 72/101  bone]
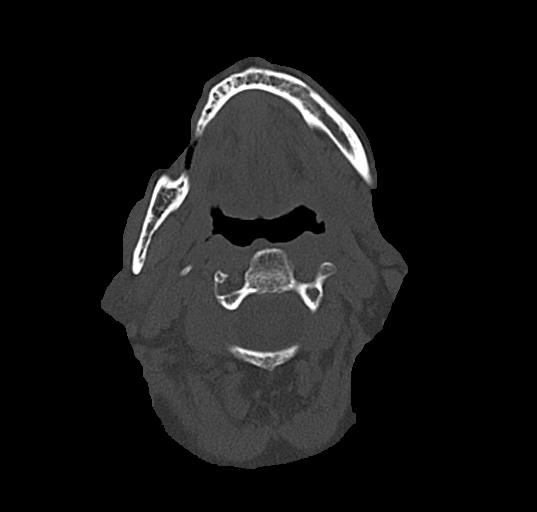
[im 86/101  bone]
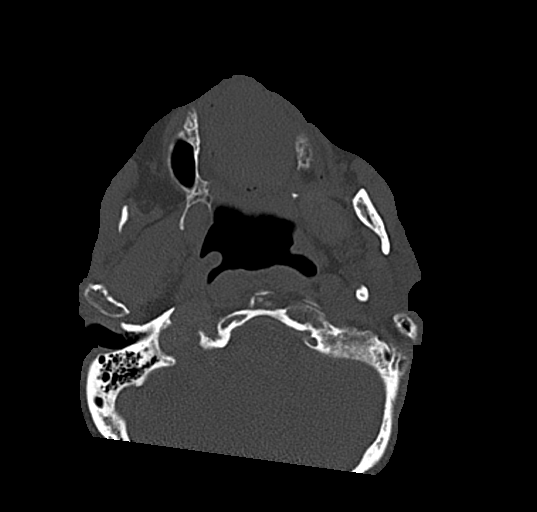

[14 of 33 positions shown; findings below may reference images not displayed]

FINDINGS: CT HEAD FINDINGS

There is mild age-related atrophy and chronic microvascular ischemic
changes. There is no acute intracranial hemorrhage. No mass effect
or midline shift noted.

There is mild mucoperiosteal thickening of paranasal sinuses. No
air-fluid level. The mastoid air cells are clear. The calvarium is
intact.

CT MAXILLOFACIAL FINDINGS

There is no acute facial bone fractures. The maxilla, mandible, and
pterygoid plates are intact. The globes, retro-orbital fat, and
orbital walls are preserved. Soft tissues appear unremarkable.

CT CERVICAL SPINE FINDINGS

There is no acute fracture or subluxation of the cervical
spine.There is osteopenia with multilevel degenerative changes and
facet hypertrophy.The odontoid and spinous processes are
intact.There is normal anatomic alignment of the C1-C2 lateral
masses. The visualized soft tissues appear unremarkable.
IMPRESSION: No acute intracranial pathology.

No acute/ traumatic cervical spine pathology.

No acute facial bone fractures.

## 2017-12-10 DIAGNOSIS — R21 Rash and other nonspecific skin eruption: Secondary | ICD-10-CM | POA: Diagnosis not present

## 2018-01-15 ENCOUNTER — Other Ambulatory Visit: Payer: Self-pay | Admitting: Neurology

## 2018-01-21 DIAGNOSIS — R413 Other amnesia: Secondary | ICD-10-CM | POA: Diagnosis not present

## 2018-01-21 DIAGNOSIS — I10 Essential (primary) hypertension: Secondary | ICD-10-CM | POA: Diagnosis not present

## 2018-01-21 DIAGNOSIS — E559 Vitamin D deficiency, unspecified: Secondary | ICD-10-CM | POA: Diagnosis not present

## 2018-01-21 DIAGNOSIS — Z23 Encounter for immunization: Secondary | ICD-10-CM | POA: Diagnosis not present

## 2018-01-21 DIAGNOSIS — E782 Mixed hyperlipidemia: Secondary | ICD-10-CM | POA: Diagnosis not present

## 2018-01-29 DIAGNOSIS — R05 Cough: Secondary | ICD-10-CM | POA: Diagnosis not present

## 2018-01-29 DIAGNOSIS — R062 Wheezing: Secondary | ICD-10-CM | POA: Diagnosis not present

## 2018-01-29 DIAGNOSIS — R0982 Postnasal drip: Secondary | ICD-10-CM | POA: Diagnosis not present

## 2018-01-30 ENCOUNTER — Other Ambulatory Visit: Payer: Self-pay

## 2018-01-30 ENCOUNTER — Encounter (HOSPITAL_BASED_OUTPATIENT_CLINIC_OR_DEPARTMENT_OTHER): Payer: Self-pay | Admitting: Emergency Medicine

## 2018-01-30 ENCOUNTER — Emergency Department (HOSPITAL_BASED_OUTPATIENT_CLINIC_OR_DEPARTMENT_OTHER): Payer: Medicare HMO

## 2018-01-30 ENCOUNTER — Observation Stay (HOSPITAL_BASED_OUTPATIENT_CLINIC_OR_DEPARTMENT_OTHER)
Admission: EM | Admit: 2018-01-30 | Discharge: 2018-02-01 | Disposition: A | Discharge status: Home / Self Care | Payer: Medicare HMO | Attending: Internal Medicine | Admitting: Internal Medicine

## 2018-01-30 DIAGNOSIS — M6281 Muscle weakness (generalized): Secondary | ICD-10-CM | POA: Diagnosis not present

## 2018-01-30 DIAGNOSIS — Z7982 Long term (current) use of aspirin: Secondary | ICD-10-CM | POA: Diagnosis not present

## 2018-01-30 DIAGNOSIS — M8580 Other specified disorders of bone density and structure, unspecified site: Secondary | ICD-10-CM | POA: Insufficient documentation

## 2018-01-30 DIAGNOSIS — R531 Weakness: Secondary | ICD-10-CM | POA: Diagnosis present

## 2018-01-30 DIAGNOSIS — R2681 Unsteadiness on feet: Secondary | ICD-10-CM | POA: Insufficient documentation

## 2018-01-30 DIAGNOSIS — Z79899 Other long term (current) drug therapy: Secondary | ICD-10-CM | POA: Diagnosis not present

## 2018-01-30 DIAGNOSIS — J189 Pneumonia, unspecified organism: Principal | ICD-10-CM | POA: Diagnosis present

## 2018-01-30 DIAGNOSIS — F0390 Unspecified dementia without behavioral disturbance: Secondary | ICD-10-CM | POA: Diagnosis not present

## 2018-01-30 DIAGNOSIS — I10 Essential (primary) hypertension: Secondary | ICD-10-CM | POA: Diagnosis not present

## 2018-01-30 DIAGNOSIS — R509 Fever, unspecified: Secondary | ICD-10-CM | POA: Diagnosis not present

## 2018-01-30 DIAGNOSIS — R05 Cough: Secondary | ICD-10-CM | POA: Diagnosis not present

## 2018-01-30 LAB — CBC WITH DIFFERENTIAL/PLATELET
Abs Immature Granulocytes: 0.04 10*3/uL (ref 0.00–0.07)
Basophils Absolute: 0.1 10*3/uL (ref 0.0–0.1)
Basophils Relative: 0 %
EOS ABS: 0.1 10*3/uL (ref 0.0–0.5)
Eosinophils Relative: 1 %
HEMATOCRIT: 43.1 % (ref 36.0–46.0)
Hemoglobin: 13.5 g/dL (ref 12.0–15.0)
Immature Granulocytes: 0 %
LYMPHS ABS: 1.1 10*3/uL (ref 0.7–4.0)
Lymphocytes Relative: 10 %
MCH: 32.4 pg (ref 26.0–34.0)
MCHC: 31.3 g/dL (ref 30.0–36.0)
MCV: 103.4 fL — AB (ref 80.0–100.0)
MONOS PCT: 13 %
Monocytes Absolute: 1.5 10*3/uL — ABNORMAL HIGH (ref 0.1–1.0)
Neutro Abs: 8.5 10*3/uL — ABNORMAL HIGH (ref 1.7–7.7)
Neutrophils Relative %: 76 %
Platelets: 216 10*3/uL (ref 150–400)
RBC: 4.17 MIL/uL (ref 3.87–5.11)
RDW: 12.2 % (ref 11.5–15.5)
WBC: 11.3 10*3/uL — ABNORMAL HIGH (ref 4.0–10.5)
nRBC: 0 % (ref 0.0–0.2)

## 2018-01-30 LAB — COMPREHENSIVE METABOLIC PANEL
ALBUMIN: 3.4 g/dL — AB (ref 3.5–5.0)
ALK PHOS: 61 U/L (ref 38–126)
ALT: 10 U/L (ref 0–44)
AST: 15 U/L (ref 15–41)
Anion gap: 9 (ref 5–15)
BILIRUBIN TOTAL: 0.8 mg/dL (ref 0.3–1.2)
BUN: 18 mg/dL (ref 8–23)
CALCIUM: 8.8 mg/dL — AB (ref 8.9–10.3)
CO2: 27 mmol/L (ref 22–32)
CREATININE: 1.04 mg/dL — AB (ref 0.44–1.00)
Chloride: 100 mmol/L (ref 98–111)
GFR calc non Af Amer: 49 mL/min — ABNORMAL LOW (ref >60)
GFR, EST AFRICAN AMERICAN: 57 mL/min — AB (ref >60)
GLUCOSE: 108 mg/dL — AB (ref 70–99)
Potassium: 3.6 mmol/L (ref 3.5–5.1)
SODIUM: 136 mmol/L (ref 135–145)
TOTAL PROTEIN: 7.2 g/dL (ref 6.5–8.1)

## 2018-01-30 LAB — INFLUENZA PANEL BY PCR (TYPE A & B)
Influenza A By PCR: NEGATIVE
Influenza B By PCR: NEGATIVE

## 2018-01-30 LAB — I-STAT CG4 LACTIC ACID, ED: Lactic Acid, Venous: 0.92 mmol/L (ref 0.5–1.9)

## 2018-01-30 MED ORDER — SODIUM CHLORIDE 0.9 % IV SOLN
INTRAVENOUS | Status: DC | PRN
  Administered 2018-01-30: 250 mL via INTRAVENOUS

## 2018-01-30 MED ORDER — SODIUM CHLORIDE 0.9 % IV SOLN
INTRAVENOUS | Status: DC | PRN
  Administered 2018-01-30: 250 mL via INTRAVENOUS
  Administered 2018-01-31: 1000 mL via INTRAVENOUS

## 2018-01-30 MED ORDER — SODIUM CHLORIDE 0.9 % IV SOLN
2.0000 g | INTRAVENOUS | Status: DC
  Administered 2018-01-30: 2 g via INTRAVENOUS
  Filled 2018-01-30: qty 20

## 2018-01-30 MED ORDER — SODIUM CHLORIDE 0.9 % IV SOLN
500.0000 mg | INTRAVENOUS | Status: DC
Start: 2018-01-30 — End: 2018-01-31
  Administered 2018-01-30: 500 mg via INTRAVENOUS
  Filled 2018-01-30: qty 500

## 2018-01-30 MED ORDER — AZITHROMYCIN 500 MG IV SOLR
INTRAVENOUS | Status: AC
  Filled 2018-01-30: qty 500

## 2018-01-30 NOTE — ED Notes (Signed)
Updated patient and family about bed status. Will continue to monitor

## 2018-01-30 NOTE — ED Triage Notes (Signed)
Pt went to UC yesterday for not feeling well and was found wheezing. Pt given prescriptions yesterday. Told to come to ER if ran a fever. Home temp 101.5. Pt appears unwell. 99.4 oral. Tylenol PTA. 166/69 HR 102

## 2018-01-30 NOTE — ED Notes (Signed)
Family mbr with pt is not sure if pt had any Tylenol (or similar) today

## 2018-01-30 NOTE — ED Notes (Signed)
Called [facility] for consult to the hospitalist @ [facility]. 

## 2018-01-30 NOTE — ED Provider Notes (Signed)
[facility] EMERGENCY DEPARTMENT Provider Note   CSN: [id number] Arrival date & time: 01/30/18  1807     History   Chief Complaint Chief Complaint  Patient presents with  . Fever    HPI Julie Martinez is a 85 y.o. female.  HPI Level 5 caveat dementia.  History is obtained from patient's daughter who accompanies her.  Patient has had a cough and fever for the past 3 days.  Her temperature was 101.5 approximately 30 minutes prior to coming here.  She has had no vomiting.  She was seen in urgent care center yesterday.  Given a cough medicine and possibly an antibiotic.  Daughter does not recall.  Told to come to the emergency department if symptoms continue.  A chest x-ray was not obtained yesterday per the daughter. Past Medical History:  Diagnosis Date  . High cholesterol   . Hypertension   . Osteopenia     Patient Active Problem List   Diagnosis Date Noted  . Dementia ([place]) 01/04/2017  . Falls 01/04/2017  . Gait abnormality 01/04/2017  . Left lumbar radiculopathy 05/25/2016  . Complaints of memory disturbance 01/28/2015  . Depression 01/28/2015    Past Surgical History:  Procedure Laterality Date  . ABDOMINAL HYSTERECTOMY    . EYE SURGERY    . SHOULDER SURGERY  2017   plating for fracture     OB History   No obstetric history on file.      Home Medications    Prior to Admission medications   Medication Sig Start Date End Date Taking? Authorizing Provider  Acetaminophen (TYLENOL ARTHRITIS PAIN PO) Take by mouth.    [provider]  alendronate (FOSAMAX) 70 MG tablet Take 70 mg by mouth once a week. Take with a full glass of water on an empty stomach.    [provider]  aspirin 81 MG EC tablet Take 81 mg by mouth daily.    [provider]  donepezil (ARICEPT) 10 MG tablet Take 1 tablet (10 mg total) by mouth at bedtime. 01/04/17   Melvenia Beam, MD  memantine (NAMENDA) 10 MG tablet Take 1 tablet (10 mg total) by  mouth 2 (two) times daily. 01/04/17   Melvenia Beam, MD  quinapril (ACCUPRIL) 20 MG tablet Take 20 mg by mouth at bedtime.    [provider]  sertraline (ZOLOFT) 25 MG tablet Take 25 mg by mouth daily.    [provider]  triamterene-hydrochlorothiazide (DYAZIDE) 37.5-25 MG capsule Take 1 capsule by mouth daily. 10/05/14   [provider]    Family History Family History  Problem Relation Age of Onset  . Heart failure Mother     Social History Social History   Tobacco Use  . Smoking status: Never Smoker  . Smokeless tobacco: Never Used  Substance Use Topics  . Alcohol use: No  . Drug use: No     Allergies   Patient has no known allergies.   Review of Systems Review of Systems  Unable to perform ROS: Dementia  Constitutional: Positive for fever.  Respiratory: Positive for cough.      Physical Exam Updated Vital Signs BP (!) 166/69 (BP Location: Right Arm)   Pulse (!) 101   Temp 99.4 F (37.4 C) (Oral)   Resp 20   Ht 5\' 2"  (1.575 m)   Wt 52.6 kg   SpO2 95%   BMI 21.22 kg/m   Physical Exam Vitals signs and nursing note reviewed.  Constitutional:  Appearance: She is well-developed.     Comments: Frail-appearing no respiratory distress  HENT:     Head: Normocephalic and atraumatic.     Mouth/Throat:     Mouth: Mucous membranes are moist.  Eyes:     Conjunctiva/sclera: Conjunctivae normal.     Pupils: Pupils are equal, round, and reactive to light.  Neck:     Musculoskeletal: Neck supple.     Thyroid: No thyromegaly.     Trachea: No tracheal deviation.  Cardiovascular:     Rate and Rhythm: Normal rate and regular rhythm.     Heart sounds: No murmur.  Pulmonary:     Effort: Pulmonary effort is normal.     Breath sounds: Rales present.     Comments: Rales at bases bilaterally Abdominal:     General: Bowel sounds are normal. There is no distension.     Palpations: Abdomen is soft.     Tenderness: There is no abdominal  tenderness.  Musculoskeletal: Normal range of motion.        General: No tenderness.  Skin:    General: Skin is warm and dry.     Findings: No rash.  Neurological:     Mental Status: She is alert.     Coordination: Coordination normal.      ED Treatments / Results  Labs (all labs ordered are listed, but only abnormal results are displayed) Labs Reviewed  CULTURE, BLOOD (ROUTINE X 2)  CULTURE, BLOOD (ROUTINE X 2)  COMPREHENSIVE METABOLIC PANEL  CBC WITH DIFFERENTIAL/PLATELET  INFLUENZA PANEL BY PCR (TYPE A & B)  I-STAT CG4 LACTIC ACID, ED    EKG EKG Interpretation  Date/Time:  Sunday January 30 2018 19:29:22 EST Ventricular Rate:  90 PR Interval:    QRS Duration: 93 QT Interval:  354 QTC Calculation: 434 R Axis:   32 Text Interpretation:  Sinus rhythm Borderline short PR interval Baseline wander in lead(s) V1 No significant change since last tracing Confirmed by Orlie Dakin [phone]) on 01/30/2018 7:31:04 PM   Radiology No results found.  Procedures Procedures (including critical care time)  Medications Ordered in ED Medications  cefTRIAXone (ROCEPHIN) 2 g in sodium chloride 0.9 % 100 mL IVPB (has no administration in time range)  azithromycin (ZITHROMAX) 500 mg in sodium chloride 0.9 % 250 mL IVPB (has no administration in time range)   Sepsis - Repeat Assessment  Performed at:    10 pm  Vitals     Blood pressure (!) 147/62, pulse 93, temperature 99.4 F (37.4 C), temperature source Oral, resp. rate 19, height 5\' 2"  (1.575 m), weight 52.6 kg, SpO2 98 %.  Heart:     Regular rate and rhythm  Lungs:    Rales  Capillary Refill:   > 2 sec  Peripheral Pulse:   Radial pulse palpable  Skin:     Normal Color P.m. she appears to be resting comfortably after treatment with intravenous antibiotics.  Chest x-ray viewed by me. Per my interpretation she may have a right lower lobe infiltrate. I have consulted Dr. Alcario Drought from hospitalist service who will arrange  for overnight stay Results for orders placed or performed during the hospital encounter of 01/30/18  Comprehensive metabolic panel  Result Value Ref Range   Sodium 136 135 - 145 mmol/L   Potassium 3.6 3.5 - 5.1 mmol/L   Chloride 100 98 - 111 mmol/L   CO2 27 22 - 32 mmol/L   Glucose, Bld 108 (H) 70 - 99 mg/dL   BUN  18 8 - 23 mg/dL   Creatinine, Ser 1.04 (H) 0.44 - 1.00 mg/dL   Calcium 8.8 (L) 8.9 - 10.3 mg/dL   Total Protein 7.2 6.5 - 8.1 g/dL   Albumin 3.4 (L) 3.5 - 5.0 g/dL   AST 15 15 - 41 U/L   ALT 10 0 - 44 U/L   Alkaline Phosphatase 61 38 - 126 U/L   Total Bilirubin 0.8 0.3 - 1.2 mg/dL   GFR calc non Af Amer 49 (L) >60 mL/min   GFR calc Af Amer 57 (L) >60 mL/min   Anion gap 9 5 - 15  CBC WITH DIFFERENTIAL  Result Value Ref Range   WBC 11.3 (H) 4.0 - 10.5 K/uL   RBC 4.17 3.87 - 5.11 MIL/uL   Hemoglobin 13.5 12.0 - 15.0 g/dL   HCT 43.1 36.0 - 46.0 %   MCV 103.4 (H) 80.0 - 100.0 fL   MCH 32.4 26.0 - 34.0 pg   MCHC 31.3 30.0 - 36.0 g/dL   RDW 12.2 11.5 - 15.5 %   Platelets 216 150 - 400 K/uL   nRBC 0.0 0.0 - 0.2 %   Neutrophils Relative % 76 %   Neutro Abs 8.5 (H) 1.7 - 7.7 K/uL   Lymphocytes Relative 10 %   Lymphs Abs 1.1 0.7 - 4.0 K/uL   Monocytes Relative 13 %   Monocytes Absolute 1.5 (H) 0.1 - 1.0 K/uL   Eosinophils Relative 1 %   Eosinophils Absolute 0.1 0.0 - 0.5 K/uL   Basophils Relative 0 %   Basophils Absolute 0.1 0.0 - 0.1 K/uL   Immature Granulocytes 0 %   Abs Immature Granulocytes 0.04 0.00 - 0.07 K/uL  I-Stat CG4 Lactic Acid, ED  Result Value Ref Range   Lactic Acid, Venous 0.92 0.5 - 1.9 mmol/L   Dg Chest 2 View  Result Date: 01/30/2018 CLINICAL DATA:  Cough and congestion for 4 days. Fever today. Nonsmoker. EXAM: CHEST - 2 VIEW COMPARISON:  03/08/2017 FINDINGS: Mild cardiac enlargement. No vascular congestion, edema, or consolidation. Emphysematous changes and fibrosis in the lungs, similar to previous study. No airspace disease or consolidation. No  blunting of costophrenic angles. No pneumothorax. Degenerative changes in the spine with anterior compression of a midthoracic vertebra, unchanged. Postoperative changes in the left humerus. IMPRESSION: Emphysematous changes and fibrosis in the lungs. No evidence of active pulmonary disease. Electronically Signed   By: Lucienne Capers M.D.   On: 01/30/2018 20:55   Initial Impression / Assessment and Plan / ED Course  I have reviewed the triage vital signs and the nursing notes.  Pertinent labs & imaging results that were available during my care of the patient were reviewed by me and considered in my medical decision making (see chart for details).   CodeSepsis called based on sirs criteria of temperature and pulse.  Source of infection felt to be respiratory  Clinically patient has pneumonia with fever, cough, rales on exam.  I have consulted Consistent with leukocytosis Final Clinical Impressions(s) / ED Diagnoses  Diagnosis community  acquired pneumonia Final diagnoses:  None    ED Discharge Orders    None       Orlie Dakin, MD 01/30/18 2229

## 2018-01-30 NOTE — ED Notes (Signed)
EMT placed Purewick on patient.

## 2018-01-30 NOTE — ED Notes (Signed)
EDP at bedside  

## 2018-01-30 NOTE — Plan of Care (Signed)
85 yo F with dementia.  Coughing, had fever 101.5 just before going to ED.  B Rales.  CXR neg  EDP thinks clinically PNA.  RLL PNA?  Thinks read is wrong.  Flu pending.

## 2018-01-31 ENCOUNTER — Encounter (HOSPITAL_COMMUNITY): Payer: Self-pay | Admitting: Internal Medicine

## 2018-01-31 DIAGNOSIS — J189 Pneumonia, unspecified organism: Secondary | ICD-10-CM

## 2018-01-31 DIAGNOSIS — I10 Essential (primary) hypertension: Secondary | ICD-10-CM | POA: Diagnosis not present

## 2018-01-31 DIAGNOSIS — F0390 Unspecified dementia without behavioral disturbance: Secondary | ICD-10-CM | POA: Diagnosis not present

## 2018-01-31 LAB — MRSA PCR SCREENING: MRSA by PCR: NEGATIVE

## 2018-01-31 MED ORDER — GUAIFENESIN 100 MG/5ML PO SOLN: 5.0000 mL | ORAL | Status: DC | PRN

## 2018-01-31 MED ORDER — AZITHROMYCIN 500 MG PO TABS
500.0000 mg | ORAL_TABLET | ORAL | Status: DC
  Administered 2018-01-31 – 2018-02-01 (×2): 500 mg via ORAL
  Filled 2018-01-31 (×2): qty 1

## 2018-01-31 MED ORDER — ALBUTEROL SULFATE (2.5 MG/3ML) 0.083% IN NEBU: 2.5000 mg | INHALATION_SOLUTION | RESPIRATORY_TRACT | Status: DC | PRN

## 2018-01-31 MED ORDER — ENOXAPARIN SODIUM 40 MG/0.4ML ~~LOC~~ SOLN
40.0000 mg | SUBCUTANEOUS | Status: DC
  Administered 2018-01-31 – 2018-02-01 (×2): 40 mg via SUBCUTANEOUS
  Filled 2018-01-31 (×2): qty 0.4

## 2018-01-31 MED ORDER — BENZONATATE 100 MG PO CAPS: 100.0000 mg | ORAL_CAPSULE | Freq: Three times a day (TID) | ORAL | Status: DC | PRN

## 2018-01-31 MED ORDER — SODIUM CHLORIDE 0.9 % IV SOLN
1.0000 g | INTRAVENOUS | Status: DC
  Administered 2018-01-31 – 2018-02-01 (×2): 1 g via INTRAVENOUS
  Filled 2018-01-31 (×3): qty 10

## 2018-01-31 NOTE — ED Notes (Signed)
pts family member assisted into a reclining chair

## 2018-01-31 NOTE — Progress Notes (Signed)
Pt orientation to unit, room and routine. Information packet given to patient/family and safety video watched.  Admission INP armband ID verified with patient/family, and in place. SR up x 2, fall risk assessment complete with Patient and family verbalizing understanding of risks associated with falls. Pt verbalizes an understanding of how to use the call bell and to call for help before getting out of bed.  Skin, clean-dry- intact without evidence of bruising, or skin tears.   No evidence of skin break down noted on exam.  Hosie Spangle, RN 01/31/2018 10:53 AM

## 2018-01-31 NOTE — Care Management Obs Status (Signed)
[facility] NOTIFICATION   Patient Details  Name: Julie Martinez MRN: [id number] Date of Birth: Nov 29, 1931   Medicare Observation Status Notification Given:  Yes    Sharin Mons, RN 01/31/2018, 4:22 PM

## 2018-01-31 NOTE — H&P (Signed)
History and Physical    Julie Martinez MVH:[id number] DOB: 1931-04-06 DOA: 01/30/2018  PCP: Leighton Ruff, MD  Patient coming from: home   I have personally briefly reviewed patient's old medical records in [place] and Care everywhere.   Chief Complaint: fever and cough   HPI: Julie Martinez is a 85 y.o. female with medical history significant of dementia and hypertension who is transferred from [facility] ED where she presented with not feeling well and fever for at least 3 days.  Patient has dementia and  she will says that she just not feeling well.  According to the daughter at bedside, about 3 days ago she started not feeling well, she had low-grade fever, had dry cough and poor appetite.  Patient had nausea but no vomiting.  She did not participate well for activities at home.  She just did not feel well.  No fall at home.  No syncopal episodes.  No dizziness or lightheadedness.  She has normal urine and bowel habits.  Patient was seen at urgent care, she was told to take over-the-counter cough medications and to go to the ER if any fever.  She had recorded temperature of 101 at home so went back to ER.  Patient's another daughter was sick with flulike symptoms that she is improving now.  ED Course: Patient was hemodynamically stable.  Temperature maximum was 99.4.  WBC count is normal.  Chest x-ray shows no acute changes.  Lactate is normal.  Patient was treated as community-acquired pneumonia and given a dose of Rocephin 2 g and azithromycin.  Blood cultures were drawn.  Flu swab was negative.  Review of Systems: limited due to dementia. Provided by family.    Past Medical History:  Diagnosis Date  . High cholesterol   . Hypertension   . Osteopenia     Past Surgical History:  Procedure Laterality Date  . ABDOMINAL HYSTERECTOMY    . EYE SURGERY    . SHOULDER SURGERY  2017   plating for fracture     reports that she has never smoked. She has never used smokeless tobacco.  She reports that she does not drink alcohol or use drugs.  No Known Allergies  Family History  Problem Relation Age of Onset  . Heart failure Mother      Prior to Admission medications   Medication Sig Start Date End Date Taking? Authorizing Provider  Acetaminophen (TYLENOL ARTHRITIS PAIN PO) Take by mouth.    [provider]  alendronate (FOSAMAX) 70 MG tablet Take 70 mg by mouth once a week. Take with a full glass of water on an empty stomach.    [provider]  aspirin 81 MG EC tablet Take 81 mg by mouth daily.    [provider]  donepezil (ARICEPT) 10 MG tablet Take 1 tablet (10 mg total) by mouth at bedtime. 01/04/17   Melvenia Beam, MD  memantine (NAMENDA) 10 MG tablet Take 1 tablet (10 mg total) by mouth 2 (two) times daily. 01/04/17   Melvenia Beam, MD  quinapril (ACCUPRIL) 20 MG tablet Take 20 mg by mouth at bedtime.    [provider]  sertraline (ZOLOFT) 25 MG tablet Take 25 mg by mouth daily.    [provider]  triamterene-hydrochlorothiazide (DYAZIDE) 37.5-25 MG capsule Take 1 capsule by mouth daily. 10/05/14   [provider]    Physical Exam: Vitals:   01/31/18 0757 01/31/18 0757 01/31/18 0833 01/31/18 1018  BP: (!) 150/64 Marland Kitchen)  150/64 (!) 150/64 (!) 129/49  Pulse: 84 86 90 87  Resp:  20 20 18   Temp:  99.5 F (37.5 C)  99.1 F (37.3 C)  TempSrc:  Oral  Oral  SpO2: 94% 94% 95% 95%  Weight:      Height:        Constitutional: NAD, calm, comfortable Vitals:   01/31/18 0757 01/31/18 0757 01/31/18 0833 01/31/18 1018  BP: (!) 150/64 (!) 150/64 (!) 150/64 (!) 129/49  Pulse: 84 86 90 87  Resp:  20 20 18   Temp:  99.5 F (37.5 C)  99.1 F (37.3 C)  TempSrc:  Oral  Oral  SpO2: 94% 94% 95% 95%  Weight:      Height:       Eyes: PERRL, lids and conjunctivae normal ENMT: Mucous membranes are moist. Posterior pharynx clear of any exudate or lesions.Normal dentition.  Neck: normal, supple, no masses, no  thyromegaly Respiratory: clear to auscultation bilaterally, no wheezing, no crackles. Normal respiratory effort. No accessory muscle use. No added sounds.  Cardiovascular: Regular rate and rhythm, no murmurs / rubs / gallops. No extremity edema. 2+ pedal pulses. No carotid bruits.  Abdomen: no tenderness, no masses palpated. No hepatosplenomegaly. Bowel sounds positive.  Musculoskeletal: no clubbing / cyanosis. No joint deformity upper and lower extremities. Good ROM, no contractures. Normal muscle tone.  Skin: no rashes, lesions, ulcers. No induration Neurologic: CN 2-12 grossly intact. Sensation intact, DTR normal. Strength 5/5 in all 4.  Psychiatric: Normal judgment and insight. Alert and oriented x1. Normal mood. Pleasant and confused.     Labs on Admission: I have personally reviewed following labs and imaging studies  CBC: Recent Labs  Lab 01/30/18 1925  WBC 11.3*  NEUTROABS 8.5*  HGB 13.5  HCT 43.1  MCV 103.4*  PLT 390   Basic Metabolic Panel: Recent Labs  Lab 01/30/18 1925  NA 136  K 3.6  CL 100  CO2 27  GLUCOSE 108*  BUN 18  CREATININE 1.04*  CALCIUM 8.8*   GFR: Estimated Creatinine Clearance: 31.8 mL/min (A) (by C-G formula based on SCr of 1.04 mg/dL (H)). Liver Function Tests: Recent Labs  Lab 01/30/18 1925  AST 15  ALT 10  ALKPHOS 61  BILITOT 0.8  PROT 7.2  ALBUMIN 3.4*   No results for input(s): LIPASE, AMYLASE in the last 168 hours. No results for input(s): AMMONIA in the last 168 hours. Coagulation Profile: No results for input(s): INR, PROTIME in the last 168 hours. Cardiac Enzymes: No results for input(s): CKTOTAL, CKMB, CKMBINDEX, TROPONINI in the last 168 hours. BNP (last 3 results) No results for input(s): PROBNP in the last 8760 hours. HbA1C: No results for input(s): HGBA1C in the last 72 hours. CBG: No results for input(s): GLUCAP in the last 168 hours. Lipid Profile: No results for input(s): CHOL, HDL, LDLCALC, TRIG, CHOLHDL,  LDLDIRECT in the last 72 hours. Thyroid Function Tests: No results for input(s): TSH, T4TOTAL, FREET4, T3FREE, THYROIDAB in the last 72 hours. Anemia Panel: No results for input(s): VITAMINB12, FOLATE, FERRITIN, TIBC, IRON, RETICCTPCT in the last 72 hours. Urine analysis:    Component Value Date/Time   COLORURINE YELLOW 08/03/2015 2056   APPEARANCEUR CLEAR 08/03/2015 2056   LABSPEC 1.018 08/03/2015 2056   PHURINE 7.0 08/03/2015 2056   GLUCOSEU NEGATIVE 08/03/2015 2056   HGBUR NEGATIVE 08/03/2015 2056   BILIRUBINUR NEGATIVE 08/03/2015 2056   [place] NEGATIVE 08/03/2015 2056   PROTEINUR NEGATIVE 08/03/2015 2056   UROBILINOGEN 1.0 11/12/2014 1525  NITRITE NEGATIVE 08/03/2015 2056   LEUKOCYTESUR MODERATE (A) 08/03/2015 2056    Radiological Exams on Admission: Dg Chest 2 View  Result Date: 01/30/2018 CLINICAL DATA:  Cough and congestion for 4 days. Fever today. Nonsmoker. EXAM: CHEST - 2 VIEW COMPARISON:  03/08/2017 FINDINGS: Mild cardiac enlargement. No vascular congestion, edema, or consolidation. Emphysematous changes and fibrosis in the lungs, similar to previous study. No airspace disease or consolidation. No blunting of costophrenic angles. No pneumothorax. Degenerative changes in the spine with anterior compression of a midthoracic vertebra, unchanged. Postoperative changes in the left humerus. IMPRESSION: Emphysematous changes and fibrosis in the lungs. No evidence of active pulmonary disease. Electronically Signed   By: Lucienne Capers M.D.   On: 01/30/2018 20:55    EKG: Independently reviewed.  Normal sinus rhythm.  No acute ST-T wave changes.  Comparable to previous EKGs.  Assessment/Plan Principal Problem:   CAP (community acquired pneumonia) Active Problems:   Dementia ([place])   Essential hypertension   Fever, cough and weakness: Suspected community-acquired pneumonia: Treating as bacterial pneumonia.  Patient received 2 g of Rocephin and 500 mg of azithromycin in the  ER.  Blood cultures were drawn.  Will check Streptococcus and Legionella antigen.  Chest x-ray negative.  Patient is currently hemodynamically stable. Cultures were drawn.  We will treat her as presumed backslash pneumonia.  She can be on ceftriaxone 1 g IV daily and oral azithromycin.  Bronchodilator therapy, deep breathing exercises, incentive spirometry and cough medications.  Her presentation could just be other viral infection, flu test was negative.  There is no indication to check for other viral respiratory panel.  Symptomatic treatment.  Hypertension: Fairly stable.  Her blood pressures are adequate.  Will resume home medications.  Dementia without behavioral disturbances: Patient lives at home.  She has adequate home support.  Patient will go back home, anticipate tomorrow with family.  DVT prophylaxis: Lovenox subcu. Code Status: Full code.  This was discussed with patient's healthcare power of attorney at the bedside. Family Communication: Daughter at the bedside. Disposition Plan: Home. Consults called: None. Admission status: Observation.   Barb Merino MD [facility] Pager [phone]  If 7PM-7AM, please contact night-coverage [email or web address] Password [facility]  01/31/2018, 11:15 AM

## 2018-01-31 NOTE — Care Management CC44 (Signed)
Condition Code 44 Documentation Completed  Patient Details  Name: Julie Martinez MRN: [id number] Date of Birth: May 07, 1931   Condition Code 44 given:  Yes Patient signature on Condition Code 44 notice:  Yes Documentation of 2 MD's agreement:  Yes Code 44 added to claim:  Yes    Sharin Mons, RN 01/31/2018, 4:22 PM

## 2018-01-31 NOTE — ED Notes (Signed)
Spoke to MD about getting Tele orders. Provider stated he would need to speak to with case managers. RN acknowledged and will wait for further instructions and orders.

## 2018-01-31 NOTE — ED Notes (Signed)
Sent page to admitting MD regarding orders

## 2018-02-01 DIAGNOSIS — I10 Essential (primary) hypertension: Secondary | ICD-10-CM | POA: Diagnosis not present

## 2018-02-01 DIAGNOSIS — F0390 Unspecified dementia without behavioral disturbance: Secondary | ICD-10-CM | POA: Diagnosis not present

## 2018-02-01 DIAGNOSIS — J189 Pneumonia, unspecified organism: Secondary | ICD-10-CM | POA: Diagnosis not present

## 2018-02-01 LAB — CBC
HCT: 40.1 % (ref 36.0–46.0)
Hemoglobin: 13 g/dL (ref 12.0–15.0)
MCH: 32.2 pg (ref 26.0–34.0)
MCHC: 32.4 g/dL (ref 30.0–36.0)
MCV: 99.3 fL (ref 80.0–100.0)
Platelets: 247 10*3/uL (ref 150–400)
RBC: 4.04 MIL/uL (ref 3.87–5.11)
RDW: 11.9 % (ref 11.5–15.5)
WBC: 10.8 10*3/uL — AB (ref 4.0–10.5)
nRBC: 0 % (ref 0.0–0.2)

## 2018-02-01 LAB — HIV ANTIBODY (ROUTINE TESTING W REFLEX): HIV Screen 4th Generation wRfx: NONREACTIVE

## 2018-02-01 MED ORDER — SERTRALINE HCL 25 MG PO TABS
25.0000 mg | ORAL_TABLET | Freq: Every day | ORAL | Status: DC
  Administered 2018-02-01: 25 mg via ORAL
  Filled 2018-02-01: qty 1

## 2018-02-01 MED ORDER — AZITHROMYCIN 500 MG PO TABS: 500.0000 mg | ORAL_TABLET | ORAL | 0 refills | Status: AC

## 2018-02-01 MED ORDER — TRIAMTERENE-HCTZ 37.5-25 MG PO TABS
1.0000 | ORAL_TABLET | Freq: Every day | ORAL | Status: DC
  Administered 2018-02-01: 1 via ORAL
  Filled 2018-02-01: qty 1

## 2018-02-01 MED ORDER — ASPIRIN EC 81 MG PO TBEC
81.0000 mg | DELAYED_RELEASE_TABLET | Freq: Every day | ORAL | Status: DC
  Administered 2018-02-01: 81 mg via ORAL
  Filled 2018-02-01: qty 1

## 2018-02-01 MED ORDER — TRIAMTERENE-HCTZ 37.5-25 MG PO CAPS
1.0000 | ORAL_CAPSULE | Freq: Every day | ORAL | Status: DC
  Filled 2018-02-01: qty 1

## 2018-02-01 MED ORDER — DONEPEZIL HCL 10 MG PO TABS: 10.0000 mg | ORAL_TABLET | Freq: Every day | ORAL | Status: DC

## 2018-02-01 MED ORDER — MEMANTINE HCL 10 MG PO TABS
10.0000 mg | ORAL_TABLET | Freq: Two times a day (BID) | ORAL | Status: DC
  Administered 2018-02-01: 10 mg via ORAL
  Filled 2018-02-01: qty 1

## 2018-02-01 MED ORDER — QUINAPRIL HCL 10 MG PO TABS: 20.0000 mg | ORAL_TABLET | Freq: Every day | ORAL | Status: DC

## 2018-02-01 NOTE — Evaluation (Signed)
Physical Therapy Evaluation Patient Details Name: Julie Martinez MRN: [id number] DOB: 1931/05/20 Today's Date: 02/01/2018   History of Present Illness  Patient is a 85 y/o female presenting to the ED on 01/31/18 with primary complaints of fever and cough. PMH signficant for dementia and hypertension. Being treated for CAP.     Clinical Impression  Patient admitted with the above listed diagnosis. Patient reports Mod I with mobility prior to admission - no family present to determine accuracy of PLOF. Patient up ambulating in room without AD with unsteady gait pattern. Education on need for RW for mobility for increased stability. Min guard throughout mobility for safety. Will recommend HHPT and initial 24/hr supervision once at home for safety. PT to follow acutely.     Follow Up Recommendations Home health PT;Supervision/Assistance - 24 hour    Equipment Recommendations  Rolling walker with 5" wheels    Recommendations for Other Services       Precautions / Restrictions Precautions Precautions: Fall Restrictions Weight Bearing Restrictions: No      Mobility  Bed Mobility               General bed mobility comments: up in restroom upon arrival  Transfers Overall transfer level: Needs assistance Equipment used: Rolling walker (2 wheeled);None Transfers: Sit to/from Stand Sit to Stand: Min guard         General transfer comment: min guard for safety  Ambulation/Gait Ambulation/Gait assistance: Min guard Gait Distance (Feet): 160 Feet Assistive device: Rolling walker (2 wheeled) Gait Pattern/deviations: Step-through pattern;Decreased stride length;Trunk flexed Gait velocity: decreased   General Gait Details: patient ambulating in room with unsteady gait pattern; instructed on use of RW for increased stability with patient only requiring min guard for safety  Stairs            Wheelchair Mobility    Modified Rankin (Stroke Patients Only)        Balance Overall balance assessment: Needs assistance Sitting-balance support: No upper extremity supported;Feet supported Sitting balance-Leahy Scale: Good     Standing balance support: Bilateral upper extremity supported;During functional activity Standing balance-Leahy Scale: Fair                               Pertinent Vitals/Pain Pain Assessment: No/denies pain    Home Living Family/patient expects to be discharged to:: Private residence Living Arrangements: Children Available Help at Discharge: Family;Available PRN/intermittently Type of Home: House Home Access: Stairs to enter Entrance Stairs-Rails: None Entrance Stairs-Number of Steps: 1 Home Layout: Two level;Able to live on main level with bedroom/bathroom Home Equipment: Gilford Rile - 2 wheels;Cane - single point      Prior Function Level of Independence: Independent               Hand Dominance        Extremity/Trunk Assessment   Upper Extremity Assessment Upper Extremity Assessment: Defer to OT evaluation    Lower Extremity Assessment Lower Extremity Assessment: Generalized weakness    Cervical / Trunk Assessment Cervical / Trunk Assessment: Kyphotic  Communication   Communication: No difficulties  Cognition Arousal/Alertness: Awake/alert Behavior During Therapy: WFL for tasks assessed/performed Overall Cognitive Status: Within Functional Limits for tasks assessed                                        General Comments General comments (  skin integrity, edema, etc.): patient stating multiple times "I wish Jesus would just go ahead and take me"    Exercises     Assessment/Plan    PT Assessment Patient needs continued PT services  PT Problem List Decreased strength;Decreased activity tolerance;Decreased balance;Decreased mobility;Decreased knowledge of use of DME;Decreased safety awareness       PT Treatment Interventions DME instruction;Gait training;Stair  training;Functional mobility training;Therapeutic activities;Balance training;Therapeutic exercise;Patient/family education    PT Goals (Current goals can be found in the Care Plan section)  Acute Rehab PT Goals Patient Stated Goal: none stated PT Goal Formulation: With patient Time For Goal Achievement: 02/15/18 Potential to Achieve Goals: Good    Frequency Min 3X/week   Barriers to discharge        Co-evaluation               AM-PAC PT "6 Clicks" Mobility  Outcome Measure Help needed turning from your back to your side while in a flat bed without using bedrails?: A Little Help needed moving from lying on your back to sitting on the side of a flat bed without using bedrails?: A Little Help needed moving to and from a bed to a chair (including a wheelchair)?: A Little Help needed standing up from a chair using your arms (e.g., wheelchair or bedside chair)?: A Little Help needed to walk in hospital room?: A Little Help needed climbing 3-5 steps with a railing? : A Lot 6 Click Score: 17    End of Session Equipment Utilized During Treatment: Gait belt Activity Tolerance: Patient tolerated treatment well Patient left: in chair;with call bell/phone within reach;with chair alarm set Nurse Communication: Mobility status PT Visit Diagnosis: Unsteadiness on feet (R26.81);Other abnormalities of gait and mobility (R26.89);Muscle weakness (generalized) (M62.81)    Time: 3009-2330 PT Time Calculation (min) (ACUTE ONLY): 17 min   Charges:   PT Evaluation $PT Eval Moderate Complexity: 1 Mod          Lanney Gins, PT, DPT Supplemental Physical Therapist 02/01/18 11:20 AM Pager: [phone] Office: [phone]

## 2018-02-01 NOTE — Discharge Summary (Signed)
Julie Martinez XTA:[id number] DOB: 12/10/1931 DOA: 01/30/2018  PCP: Leighton Ruff, MD  Admit date: 01/30/2018  Discharge date: 02/01/2018  Admitted From: Home  Disposition:  Home   Recommendations for Outpatient Follow-up:   Follow up with PCP in 1-2 weeks  PCP Please obtain BMP/CBC, 2 view CXR in 1week,  (see Discharge instructions)   PCP Please follow up on the following pending results:    Home Health: PT,RN   Equipment/Devices: None  Consultations: None Discharge Condition: Stable   CODE STATUS: Full   Diet Recommendation: Heart Healthy     Chief Complaint  Patient presents with  . Fever     Brief history of present illness from the day of admission and additional interim summary    Julie Martinez is a 85 y.o. female with medical history significant of dementia and hypertension who is transferred from [facility] ED where she presented with not feeling well and fever for at least 3 days.  Patient has dementia and  she will says that she just not feeling well.  According to the daughter at bedside, about 3 days ago she started not feeling well, she had low-grade fever, had dry cough and poor appetite.  Patient had nausea but no vomiting.  She did not participate well for activities at home.  She just did not feel well.  No fall at home.  No syncopal episodes.  No dizziness or lightheadedness.  She has normal urine and bowel habits.  Patient was seen at urgent care, she was told to take over-the-counter cough medications and to go to the ER if any fever.  She had recorded temperature of 101 at home so went back to ER.  Patient's another daughter was sick with flulike symptoms that she is improving now.  ED Course: Patient was hemodynamically stable.  Temperature maximum was 99.4.  WBC count is normal.   Chest x-ray shows no acute changes.  Lactate is normal.  Patient was treated as community-acquired pneumonia and given a dose of Rocephin 2 g and azithromycin.  Blood cultures were drawn.  Flu swab was negative.                                                                 Hospital Course    1.  Acute bronchitis versus early community-acquired pneumonia.  Cultures negative so far, ruled out for influenza, given IV antibiotics empiric, remarkable improvement within 24 hours, symptom-free except for mild dry cough, stable on room air upon ambulation.  Will be discharged home on 4 more days of oral azithromycin with outpatient PCP follow-up.  Will get home PT and RN due to baseline underlying deconditioning, weakness.  Other chronic medical problems hypertension and dementia stable home medications to be continued.   Discharge diagnosis  Principal Problem:   CAP (community acquired pneumonia) Active Problems:   Dementia [facility])   Essential hypertension    Discharge instructions    Discharge Instructions    Diet - low sodium heart healthy   Complete by:  As directed    Discharge instructions   Complete by:  As directed    Follow with Primary MD Leighton Ruff, MD in 7 days   Get CBC, CMP, 2 view Chest X ray -  checked  by Primary MD  in 5-7 days    Activity: As tolerated with Full fall precautions use walker/cane & assistance as needed  Disposition Home    Diet: Heart Healthy      Special Instructions: If you have smoked or chewed Tobacco  in the last 2 yrs please stop smoking, stop any regular Alcohol  and or any Recreational drug use.  On your next visit with your primary care physician please Get Medicines reviewed and adjusted.  Please request your Prim.MD to go over all Hospital Tests and Procedure/Radiological results at the follow up, please get all Hospital records sent to your Prim MD by signing hospital release before you go home.  If you experience worsening  of your admission symptoms, develop shortness of breath, life threatening emergency, suicidal or homicidal thoughts you must seek medical attention immediately by calling 911 or calling your MD immediately  if symptoms less severe.  You Must read complete instructions/literature along with all the possible adverse reactions/side effects for all the Medicines you take and that have been prescribed to you. Take any new Medicines after you have completely understood and accpet all the possible adverse reactions/side effects.   Increase activity slowly   Complete by:  As directed       Discharge Medications   Allergies as of 02/01/2018   No Known Allergies     Medication List    TAKE these medications   alendronate 70 MG tablet Commonly known as:  FOSAMAX Take 70 mg by mouth once a week. Take with a full glass of water on an empty stomach.   aspirin 81 MG EC tablet Take 81 mg by mouth daily.   azithromycin 500 MG tablet Commonly known as:  ZITHROMAX Take 1 tablet (500 mg total) by mouth daily for 4 days. Start taking on:  February 02, 2018   donepezil 10 MG tablet Commonly known as:  ARICEPT Take 1 tablet (10 mg total) by mouth at bedtime.   memantine 10 MG tablet Commonly known as:  NAMENDA Take 1 tablet (10 mg total) by mouth 2 (two) times daily.   quinapril 20 MG tablet Commonly known as:  ACCUPRIL Take 20 mg by mouth at bedtime.   sertraline 25 MG tablet Commonly known as:  ZOLOFT Take 25 mg by mouth daily.   triamterene-hydrochlorothiazide 37.5-25 MG capsule Commonly known as:  DYAZIDE Take 1 capsule by mouth daily.   TYLENOL ARTHRITIS PAIN PO Take by mouth.       Follow-up Information    Leighton Ruff, MD. Schedule an appointment as soon as possible for a visit in 1 week(s).   Specialty:  Family Medicine Contact information: [place] [address] [phone]           Major procedures and Radiology Reports - PLEASE review  detailed and final reports thoroughly  -        Dg Chest 2 View  Result Date: 01/30/2018 CLINICAL DATA:  Cough and congestion for 4 days. Fever  today. Nonsmoker. EXAM: CHEST - 2 VIEW COMPARISON:  03/08/2017 FINDINGS: Mild cardiac enlargement. No vascular congestion, edema, or consolidation. Emphysematous changes and fibrosis in the lungs, similar to previous study. No airspace disease or consolidation. No blunting of costophrenic angles. No pneumothorax. Degenerative changes in the spine with anterior compression of a midthoracic vertebra, unchanged. Postoperative changes in the left humerus. IMPRESSION: Emphysematous changes and fibrosis in the lungs. No evidence of active pulmonary disease. Electronically Signed   By: Lucienne Capers M.D.   On: 01/30/2018 20:55    Micro Results     Recent Results (from the past 240 hour(s))  Blood Culture (routine x 2)     Status: None (Preliminary result)   Collection Time: 01/30/18  7:10 PM  Result Value Ref Range Status   Specimen Description   Final    BLOOD RIGHT ANTECUBITAL Performed at [facility], [address] [place], [place] [address]    Special Requests   Final    BOTTLES DRAWN AEROBIC AND ANAEROBIC Blood Culture adequate volume Performed at [facility], [address] [place], [place] [address]    Culture   Final    NO GROWTH < 12 HOURS Performed at [facility], [place] [address] [place], [place] [address]    Report Status PENDING  Incomplete  Blood Culture (routine x 2)     Status: None (Preliminary result)   Collection Time: 01/30/18  7:20 PM  Result Value Ref Range Status   Specimen Description BLOOD RIGHT FOREARM  Final   Special Requests   Final    BOTTLES DRAWN AEROBIC AND ANAEROBIC Blood Culture adequate volume Performed at [facility], [address] [place], [place] [address]    Culture   Final    NO GROWTH < 12 HOURS Performed at [facility], [place] [address]  [place], [place] [address]    Report Status PENDING  Incomplete  MRSA PCR Screening     Status: None   Collection Time: 01/31/18 12:26 PM  Result Value Ref Range Status   MRSA by PCR NEGATIVE NEGATIVE Final    Comment:        The GeneXpert MRSA Assay (FDA approved for NASAL specimens only), is one component of a comprehensive MRSA colonization surveillance program. It is not intended to diagnose MRSA infection nor to guide or monitor treatment for MRSA infections. Performed at [facility], [place] [address] [place], [place] [address]     Today   Subjective    Julie Martinez today has no headache,no chest abdominal pain,no new weakness tingling or numbness, feels much better wants to go home today.     Objective   Blood pressure 131/70, pulse 82, temperature 98.5 F (36.9 C), resp. rate 20, height 5\' 2"  (1.575 m), weight 52.6 kg, SpO2 96 %.   Intake/Output Summary (Last 24 hours) at 02/01/2018 1050 Last data filed at 01/31/2018 1700 Gross per 24 hour  Intake 322 ml  Output 426 ml  Net -104 ml    Exam  Awake Alert, No new F.N deficits, Normal affect [place].AT,PERRAL Supple Neck,No JVD, No cervical lymphadenopathy appriciated.  Symmetrical Chest wall movement, Good air movement bilaterally, CTAB RRR,No Gallops,Rubs or new Murmurs, No Parasternal Heave +ve B.Sounds, Abd Soft, Non tender, No organomegaly appriciated, No rebound -guarding or rigidity. No Cyanosis, Clubbing or edema, No new Rash or bruise   Data Review   CBC w Diff:  Lab  Results  Component Value Date   WBC 10.8 (H) 02/01/2018   HGB 13.0 02/01/2018   HCT 40.1 02/01/2018   PLT 247 02/01/2018   LYMPHOPCT 10 01/30/2018   MONOPCT 13 01/30/2018   EOSPCT 1 01/30/2018   BASOPCT 0 01/30/2018    CMP:  Lab Results  Component Value Date   NA 136 01/30/2018   K 3.6 01/30/2018   CL 100 01/30/2018   CO2 27 01/30/2018   BUN 18 01/30/2018   CREATININE 1.04 (H) 01/30/2018   PROT 7.2 01/30/2018   ALBUMIN  3.4 (L) 01/30/2018   BILITOT 0.8 01/30/2018   ALKPHOS 61 01/30/2018   AST 15 01/30/2018   ALT 10 01/30/2018  .   Total Time in preparing paper work, data evaluation and todays exam - 75 minutes  Lala Lund M.D on 02/01/2018 at 10:50 AM  [facility]   Office  [phone]

## 2018-02-01 NOTE — Care Management Note (Signed)
Case Management Note  Patient Details  Name: Julie Martinez MRN: [id number] Date of Birth: 18-Jul-1931  Subjective/Objective:  Presented with  CAP. Resides with grandson.  Garald Braver (Daughter)     [phone]      PCP:  Leighton Ruff  Action/Plan: Transition to home with home health services to follow.  Expected Discharge Date:  02/01/18               Expected Discharge Plan:  [place]  In-House Referral:  NA  Discharge planning Services  CM Consult  Post Acute Care Choice:    Choice offered to:  Adult Children  DME Arranged:  N/A(owns walker) DME Agency:  NA  HH Arranged:  RN, PT HH Agency:  [facility]  Status of Service:  Completed, signed off  If discussed at [place] of Stay Meetings, dates discussed:    Additional Comments:  Sharin Mons, RN 02/01/2018, 12:11 PM

## 2018-02-01 NOTE — Progress Notes (Signed)
Patient removed both IV access sites. Ordered new IV team consult and educated patient on importance of maintaining IV access while hospitalized.

## 2018-02-01 NOTE — Discharge Instructions (Signed)
Follow with Primary MD Leighton Ruff, MD in 7 days   Get CBC, CMP, 2 view Chest X ray -  checked  by Primary MD  in 5-7 days    Activity: As tolerated with Full fall precautions use walker/cane & assistance as needed  Disposition Home    Diet: Heart Healthy      Special Instructions: If you have smoked or chewed Tobacco  in the last 2 yrs please stop smoking, stop any regular Alcohol  and or any Recreational drug use.  On your next visit with your primary care physician please Get Medicines reviewed and adjusted.  Please request your Prim.MD to go over all Hospital Tests and Procedure/Radiological results at the follow up, please get all Hospital records sent to your Prim MD by signing hospital release before you go home.  If you experience worsening of your admission symptoms, develop shortness of breath, life threatening emergency, suicidal or homicidal thoughts you must seek medical attention immediately by calling 911 or calling your MD immediately  if symptoms less severe.  You Must read complete instructions/literature along with all the possible adverse reactions/side effects for all the Medicines you take and that have been prescribed to you. Take any new Medicines after you have completely understood and accpet all the possible adverse reactions/side effects.

## 2018-02-01 NOTE — Progress Notes (Signed)
SATURATION QUALIFICATIONS: (This note is used to comply with regulatory documentation for home oxygen)  Patient Saturations on Room Air at Rest = 96%  Patient Saturations on Room Air while Ambulating = 94%  Patient Saturations on 0 Liters of oxygen while Ambulating = 95%

## 2018-02-01 NOTE — Progress Notes (Signed)
Nsg Discharge Note  Admit Date:  01/30/2018 Discharge date: 02/01/2018   Osie Bond to be D/C'd Home per MD order.  AVS completed.  Copy for chart, and copy for patient signed, and dated. Patient/caregiver able to verbalize understanding.  Discharge Medication: Allergies as of 02/01/2018   No Known Allergies     Medication List    TAKE these medications   alendronate 70 MG tablet Commonly known as:  FOSAMAX Take 70 mg by mouth once a week. Take with a full glass of water on an empty stomach.   aspirin 81 MG EC tablet Take 81 mg by mouth daily.   azithromycin 500 MG tablet Commonly known as:  ZITHROMAX Take 1 tablet (500 mg total) by mouth daily for 4 days. Start taking on:  February 02, 2018   donepezil 10 MG tablet Commonly known as:  ARICEPT Take 1 tablet (10 mg total) by mouth at bedtime.   memantine 10 MG tablet Commonly known as:  NAMENDA Take 1 tablet (10 mg total) by mouth 2 (two) times daily.   quinapril 20 MG tablet Commonly known as:  ACCUPRIL Take 20 mg by mouth at bedtime.   sertraline 25 MG tablet Commonly known as:  ZOLOFT Take 25 mg by mouth daily.   triamterene-hydrochlorothiazide 37.5-25 MG capsule Commonly known as:  DYAZIDE Take 1 capsule by mouth daily.   TYLENOL ARTHRITIS PAIN PO Take by mouth.       Discharge Assessment: Vitals:   01/31/18 2134 02/01/18 0637  BP:  131/70  Pulse:  82  Resp: 20   Temp: 98.7 F (37.1 C) 98.5 F (36.9 C)  SpO2: 97% 96%   Skin clean, dry and intact without evidence of skin break down, no evidence of skin tears noted. IV catheter discontinued intact. Site without signs and symptoms of complications - no redness or edema noted at insertion site, patient denies c/o pain - only slight tenderness at site.  Dressing with slight pressure applied.  D/c Instructions-Education: Discharge instructions given to patient/family with verbalized understanding. D/c education completed with patient/family  including follow up instructions, medication list, d/c activities limitations if indicated, with other d/c instructions as indicated by MD - patient able to verbalize understanding, all questions fully answered. Patient instructed to return to ED, call 911, or call MD for any changes in condition.  Patient escorted via [place], and D/C home via private auto.  Lavin Petteway Margaretha Sheffield, RN 02/01/2018 12:32 PM

## 2018-02-04 LAB — CULTURE, BLOOD (ROUTINE X 2)
CULTURE: NO GROWTH
Culture: NO GROWTH
Special Requests: ADEQUATE
Special Requests: ADEQUATE

## 2018-02-07 DIAGNOSIS — M8580 Other specified disorders of bone density and structure, unspecified site: Secondary | ICD-10-CM | POA: Diagnosis not present

## 2018-02-07 DIAGNOSIS — M5416 Radiculopathy, lumbar region: Secondary | ICD-10-CM | POA: Diagnosis not present

## 2018-02-07 DIAGNOSIS — Z9181 History of falling: Secondary | ICD-10-CM | POA: Diagnosis not present

## 2018-02-07 DIAGNOSIS — Z8701 Personal history of pneumonia (recurrent): Secondary | ICD-10-CM | POA: Diagnosis not present

## 2018-02-07 DIAGNOSIS — F0390 Unspecified dementia without behavioral disturbance: Secondary | ICD-10-CM | POA: Diagnosis not present

## 2018-02-07 DIAGNOSIS — F329 Major depressive disorder, single episode, unspecified: Secondary | ICD-10-CM | POA: Diagnosis not present

## 2018-02-07 DIAGNOSIS — I10 Essential (primary) hypertension: Secondary | ICD-10-CM | POA: Diagnosis not present

## 2018-02-14 DIAGNOSIS — F0390 Unspecified dementia without behavioral disturbance: Secondary | ICD-10-CM | POA: Diagnosis not present

## 2018-02-14 DIAGNOSIS — E878 Other disorders of electrolyte and fluid balance, not elsewhere classified: Secondary | ICD-10-CM | POA: Diagnosis not present

## 2018-02-14 DIAGNOSIS — F329 Major depressive disorder, single episode, unspecified: Secondary | ICD-10-CM | POA: Diagnosis not present

## 2018-02-14 DIAGNOSIS — M5416 Radiculopathy, lumbar region: Secondary | ICD-10-CM | POA: Diagnosis not present

## 2018-02-14 DIAGNOSIS — Z9181 History of falling: Secondary | ICD-10-CM | POA: Diagnosis not present

## 2018-02-14 DIAGNOSIS — M8580 Other specified disorders of bone density and structure, unspecified site: Secondary | ICD-10-CM | POA: Diagnosis not present

## 2018-02-14 DIAGNOSIS — I10 Essential (primary) hypertension: Secondary | ICD-10-CM | POA: Diagnosis not present

## 2018-02-14 DIAGNOSIS — Z8701 Personal history of pneumonia (recurrent): Secondary | ICD-10-CM | POA: Diagnosis not present

## 2018-02-14 DIAGNOSIS — J189 Pneumonia, unspecified organism: Secondary | ICD-10-CM | POA: Diagnosis not present

## 2018-02-14 DIAGNOSIS — L989 Disorder of the skin and subcutaneous tissue, unspecified: Secondary | ICD-10-CM | POA: Diagnosis not present

## 2018-02-20 ENCOUNTER — Other Ambulatory Visit: Payer: Self-pay | Admitting: Neurology

## 2018-02-22 ENCOUNTER — Other Ambulatory Visit: Payer: Self-pay | Admitting: Neurology

## 2018-02-28 DIAGNOSIS — E878 Other disorders of electrolyte and fluid balance, not elsewhere classified: Secondary | ICD-10-CM | POA: Diagnosis not present

## 2018-03-11 DIAGNOSIS — E878 Other disorders of electrolyte and fluid balance, not elsewhere classified: Secondary | ICD-10-CM | POA: Diagnosis not present

## 2018-03-16 ENCOUNTER — Other Ambulatory Visit: Payer: Self-pay | Admitting: Neurology

## 2018-03-25 DIAGNOSIS — E876 Hypokalemia: Secondary | ICD-10-CM | POA: Diagnosis not present

## 2018-04-22 ENCOUNTER — Other Ambulatory Visit: Payer: Self-pay | Admitting: Neurology

## 2018-06-12 ENCOUNTER — Other Ambulatory Visit: Payer: Self-pay | Admitting: Neurology

## 2018-07-01 DIAGNOSIS — W19XXXA Unspecified fall, initial encounter: Secondary | ICD-10-CM | POA: Diagnosis not present

## 2018-07-01 DIAGNOSIS — L03115 Cellulitis of right lower limb: Secondary | ICD-10-CM | POA: Diagnosis not present

## 2018-07-17 ENCOUNTER — Other Ambulatory Visit: Payer: Self-pay | Admitting: Neurology

## 2018-08-13 ENCOUNTER — Other Ambulatory Visit: Payer: Self-pay | Admitting: Neurology

## 2018-08-24 DIAGNOSIS — I10 Essential (primary) hypertension: Secondary | ICD-10-CM | POA: Diagnosis not present

## 2018-08-24 DIAGNOSIS — Z7982 Long term (current) use of aspirin: Secondary | ICD-10-CM | POA: Diagnosis not present

## 2018-08-24 DIAGNOSIS — Z79899 Other long term (current) drug therapy: Secondary | ICD-10-CM | POA: Diagnosis not present

## 2018-08-24 DIAGNOSIS — R102 Pelvic and perineal pain: Secondary | ICD-10-CM | POA: Diagnosis not present

## 2018-08-24 DIAGNOSIS — Z85828 Personal history of other malignant neoplasm of skin: Secondary | ICD-10-CM | POA: Diagnosis not present

## 2018-08-24 DIAGNOSIS — B373 Candidiasis of vulva and vagina: Secondary | ICD-10-CM | POA: Diagnosis not present

## 2018-08-30 DIAGNOSIS — R413 Other amnesia: Secondary | ICD-10-CM | POA: Diagnosis not present

## 2018-08-30 DIAGNOSIS — E559 Vitamin D deficiency, unspecified: Secondary | ICD-10-CM | POA: Diagnosis not present

## 2018-08-30 DIAGNOSIS — E782 Mixed hyperlipidemia: Secondary | ICD-10-CM | POA: Diagnosis not present

## 2018-10-03 DIAGNOSIS — E538 Deficiency of other specified B group vitamins: Secondary | ICD-10-CM | POA: Diagnosis not present

## 2018-10-05 ENCOUNTER — Other Ambulatory Visit: Payer: Self-pay | Admitting: Neurology

## 2018-10-21 ENCOUNTER — Other Ambulatory Visit: Payer: Self-pay | Admitting: Neurology

## 2018-10-21 ENCOUNTER — Telehealth: Payer: Self-pay | Admitting: Neurology

## 2018-10-21 NOTE — Telephone Encounter (Signed)
I declined patient's aricept. She has not been seen in almost 2 years. Have her set up an appointment (can be virtual) with amy thanks.

## 2018-10-24 NOTE — Telephone Encounter (Signed)
Called pt's daughter Garald Braver (on [place]) and LVM asking for call back asap. Left office number and hours in the message.   If she calls back, please advise we are unable to provide any further refills of the pt's donepezil and memantine as pt has not been seen in almost 2 years. Previous refill approvals and denials have had notes asking to schedule appointment. Please schedule with Amy NP (can be virtual) for a follow-up.

## 2018-10-31 NOTE — Telephone Encounter (Signed)
I called the pt's daughter Garald Braver again and LVM asking for call back as soon as possible. Left office number in message.

## 2018-11-01 NOTE — Telephone Encounter (Signed)
Noted thanks °

## 2018-11-01 NOTE — Telephone Encounter (Signed)
Advised. appt set

## 2018-11-08 NOTE — Progress Notes (Addendum)
PATIENT: Julie Martinez DOB: [birth year]  REASON FOR VISIT: follow up HISTORY FROM: patient  Chief Complaint  Patient presents with   Follow-up    Rm 5, doing ok,  Julie Martinez, daughter    Dementia     HISTORY OF PRESENT ILLNESS: Today 11/09/18 Julie Martinez is a 85 y.o. female here today for follow up.  She is present with Julie Martinez, daughter, who aids in history.  She lives at home with her grandson, Julie Martinez, who helps with medications. No falls or changes in gait.  She does not use assistive devices with walking.  She is able to perform ADLs independently.  She does not drive.  She is feeling well today.  She continues to tolerate Aricept 10 mg at night and Namenda 10 mg twice daily.  HISTORY: (copied from Dr Cathren Laine note on 01/04/2017)  12/2016: She has had falls. Here with her daughter who provides most information. Unclear why, she fell, she doesn't know why but she was bruised, she has fallen a few more times.  She falls when she walks her dog she gets pulled down but refuses not to walk the dog. She misses medications. Memory is worsening. She has been having hallucinations, seeing a man in a tree behind the house. She still has depression. I recommend a memory unit for patient, daughter and patient decline. I advised 24x7 care, she has her grandchildren 24x7.   10/2015: Today October 20 13,017 Julie Martinez is an 85 year old female with a history of memory disturbance. She returns today for follow-up. She was started on Aricept and has been tolerating it well. She continues to live home alone. She is able to complete all ADLs independently. She doesoperate a motor vehicle but only drives short distances. Her daughter is with her today and reports that she has turned on the wrongroadswhen driving to familiar places. This does not happen often. She does have a hard time sleeping. She states that she isoften thinking of her deceased son. He passed away in July 20, 2014 from [place]  disease. She does feel that she suffers from depression. Her primary care recently increase Zoloft to 3 times a day. Patient returns today for an evaluation.    [id number] A Lawsonis a 85 y.o.femalehere as a referral from Dr. Pat Kocher memory problems. Past medical history of hypertension, anxiety and depression. Here with daughter who provides most information. Memory problems at least the last year. Has been worse since 20-Jul-2022 when her son passed away from [place]. Daughter noticed she forgot a funeral she went to of daughter's brother in law in November. Son was living his mother for many years since the late nighties. They noticed more memory problems since he died. Husband passed away in 1997/98. She has been depressed since her son died. She says his dog died recently (daughter says the dog died many years ago). Patient pays the bills and she has paid all of them. No missing bills, she owns the house, all the utilities are paid. Daughter reminds mother and takes her to all appointments. Daughter goes over every day. No accidents in the home. Patient is alone a lot and she is depressed. atient repeats herself a lot, that has been going on for the last year. She doesn't remember telling daughter many things. Brother with dementia.   Reviewed notes, labs and imaging from outside physicians, which showed:   Reviewed notes from [facility]. Patient has had problems with memory, the Mini-Mental Status exam done on 1024  had a score of 25 out of 30. Patient has also had a number of losses recently her son died of ALS, her dog died and recently a son-in-law die. Patient had forgotten that the son-in-law died. Patient has a history of hypertension, uncertain if she is actually taking. She was prescribed in September 90 day prescription. She has not been checking her blood pressures at home. Diagnosed with memory problems and essential hypertension, mixed hyperlipidemia, anxiety and depression,  dizziness. She was seen at the emergency room for dizziness and she was giving meclizine, the dizziness improved. 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 She is on sertraline and that dose was increased recently. She had reported she wasn't sleeping well. Notes noted that she had a sad mood and affect.  Personally reviewed images of the brain, MRI w/o 10/2013: 1. No acute intracranial abnormality. 2. 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   REVIEW OF SYSTEMS: Out of a complete 14 system review of symptoms, the patient complains only of the following symptoms, memory loss, and all other reviewed systems are negative.  ALLERGIES: No Known Allergies  HOME MEDICATIONS: Outpatient Medications Prior to Visit  Medication Sig Dispense Refill   Acetaminophen (TYLENOL ARTHRITIS PAIN PO) Take by mouth.     alendronate (FOSAMAX) 70 MG tablet Take 70 mg by mouth once a week. Take with a full glass of water on an empty stomach.     aspirin 81 MG EC tablet Take 81 mg by mouth daily.     donepezil (ARICEPT) 10 MG tablet TAKE 1 TABLET BY MOUTH AT BEDTIME. MUST BE SEEN FOR FURTHER REFILLS. CALL SCHEDULE AN APPOINTMENT. 30 tablet 1   memantine (NAMENDA) 10 MG tablet TAKE 1 TABLET BY MOUTH TWICE A DAY 60 tablet 12   quinapril (ACCUPRIL) 20 MG tablet Take 20 mg by mouth at bedtime.     triamterene-hydrochlorothiazide (DYAZIDE) 37.5-25 MG capsule Take 1 capsule by mouth daily.  0   sertraline (ZOLOFT) 25 MG tablet Take 25 mg by mouth daily.     No facility-administered medications prior to visit.     PAST MEDICAL HISTORY: Past Medical History:  Diagnosis Date   High cholesterol    Hypertension    Osteopenia     PAST SURGICAL HISTORY: Past Surgical History:  Procedure Laterality Date   ABDOMINAL HYSTERECTOMY     EYE SURGERY     SHOULDER  SURGERY  2017   plating for fracture    FAMILY HISTORY: Family History  Problem Relation Age of Onset   Heart failure Mother     SOCIAL HISTORY: Social History   Socioeconomic History   Marital status: Widowed    Spouse name: Not on file   Number of children: 3   Years of education: 12   Highest education level: High school graduate  Occupational History   Occupation: n/a  Scientist, product/process development strain: Not on file   Food insecurity    Worry: Not on file    Inability: Not on file   Transportation needs    Medical: Not on file    Non-medical: Not on file  Tobacco Use   Smoking status: Never Smoker   Smokeless tobacco: Never Used  Substance and Sexual Activity   Alcohol use: No   Drug use: No   Sexual activity: Not on file  Lifestyle  Physical activity    Days per week: Not on file    Minutes per session: Not on file   Stress: Not on file  Relationships   Social connections    Talks on phone: Not on file    Gets together: Not on file    Attends religious service: Not on file    Active member of club or organization: Not on file    Attends meetings of clubs or organizations: Not on file    Relationship status: Not on file   Intimate partner violence    Fear of current or ex partner: Not on file    Emotionally abused: Not on file    Physically abused: Not on file    Forced sexual activity: Not on file  Other Topics Concern   Not on file  Social History Narrative   Patient lives at home alone. There is always a family member there 24/7.   Caffeine Use: 1 cup of coffee daily   Right handed      PHYSICAL EXAM  Vitals:   11/09/18 1013  BP: (!) 153/71  Pulse: 73  Temp: 98 F (36.7 C)  Weight: 116 lb 12.8 oz (53 kg)  Height: 5\' 2"  (1.575 m)   Body mass index is 21.36 kg/m.  Generalized: Well developed, in no acute distress  Cardiology: normal rate and rhythm, no murmur noted Neurological examination  Mentation:  Alert, not oriented to time, but oriented to place, and some history taking. Follows all commands speech and language fluent Cranial nerve II-XII: Pupils were equal round reactive to light. Extraocular movements were full, visual field were full on confrontational test. Facial sensation and strength were normal. Uvula tongue midline. Head turning and shoulder shrug  were normal and symmetric. Motor: The motor testing reveals 5 over 5 strength of all 4 extremities. Good symmetric motor tone is noted throughout.  Sensory: Sensory testing is intact to soft touch on all 4 extremities. No evidence of extinction is noted.  Coordination: Cerebellar testing reveals good finger-nose-finger and heel-to-shin bilaterally.  Gait and station: Gait is normal.   DIAGNOSTIC DATA (LABS, IMAGING, TESTING) - I reviewed patient records, labs, notes, testing and imaging myself where available.  MMSE - Mini Mental State Exam 11/09/2018 01/04/2017 05/11/2016  Orientation to time 0 3 4  Orientation to Place 3 4 3   Registration 3 3 3   Attention/ Calculation 2 5 5   Recall 0 0 0  Language- name 2 objects 2 2 2   Language- repeat 1 1 1   Language- follow 3 step command 3 3 3   Language- read & follow direction 1 1 1   Write a sentence 1 1 1   Copy design 1 1 1   Total score 17 24 24      Lab Results  Component Value Date   WBC 10.8 (H) 02/01/2018   HGB 13.0 02/01/2018   HCT 40.1 02/01/2018   MCV 99.3 02/01/2018   PLT 247 02/01/2018      Component Value Date/Time   NA 136 01/30/2018 1925   K 3.6 01/30/2018 1925   CL 100 01/30/2018 1925   CO2 27 01/30/2018 1925   GLUCOSE 108 (H) 01/30/2018 1925   BUN 18 01/30/2018 1925   CREATININE 1.04 (H) 01/30/2018 1925   CALCIUM 8.8 (L) 01/30/2018 1925   PROT 7.2 01/30/2018 1925   ALBUMIN 3.4 (L) 01/30/2018 1925   AST 15 01/30/2018 1925   ALT 10 01/30/2018 1925   ALKPHOS 61 01/30/2018 1925   BILITOT 0.8  01/30/2018 1925   GFRNONAA 49 (L) 01/30/2018 1925   GFRAA 57 (L)  01/30/2018 1925   No results found for: CHOL, HDL, LDLCALC, LDLDIRECT, TRIG, CHOLHDL No results found for: HGBA1C Lab Results  Component Value Date   VITAMINB12 315 01/25/2015   Lab Results  Component Value Date   TSH 1.200 01/25/2015       ASSESSMENT AND PLAN 85 y.o. year old female  has a past medical history of High cholesterol, Hypertension, and Osteopenia. here with her leg    ICD-10-CM   1. Dementia without behavioral disturbance, unspecified dementia type ([place])  F03.90     Overall Ms. Fillmore is doing very well.  MMSE today is 17.  Last score of 24 2 years ago.  Family feels that she is doing fairly well at home.  No falls or injuries.  She is able to perform ADLs.  We will continue Aricept 10 mg at night as well as Namenda 10 mg twice daily.  Family may follow-up with primary care for additional refills if Dr. Drema Dallas feels that this is appropriate.  Otherwise, they will continue to follow-up with [place] yearly.  I have advised that she keep a close eye on her blood pressures at home.  Julie Martinez states that baseline blood pressure is 130s over 70s.  Julie Martinez and Ms. Cianflone verbalized understanding and agreement with this plan.   No orders of the defined types were placed in this encounter.    No orders of the defined types were placed in this encounter.     I spent 15 minutes with the patient. 50% of this time was spent counseling and educating patient on plan of care and medications.    Debbora Presto, FNP-C 11/09/2018, 10:32 AM [facility] [address] [place], [place] [address] [phone]  Made any corrections needed, and agree with history, physical, neuro exam,assessment and plan as stated.     Sarina Ill, MD [facility]

## 2018-11-08 NOTE — Patient Instructions (Addendum)
Continue Aricpet and Namenda as prescribed   Fall precautions  Follow up in 1 year (unless PCP wishes to continue refills) Sooner if needed   Dementia Caregiver Guide Dementia is a term used to describe a number of symptoms that affect memory and thinking. The most common symptoms include:  Memory loss.  Trouble with language and communication.  Trouble concentrating.  Poor judgment.  Problems with reasoning.  Child-like behavior and language.  Extreme anxiety.  Angry outbursts.  Wandering from home or public places. Dementia usually gets worse slowly over time. In the early stages, people with dementia can stay independent and safe with some help. In later stages, they need help with daily tasks such as dressing, grooming, and using the bathroom. How to help the person with dementia cope Dementia can be frightening and confusing. Here are some tips to help the person with dementia cope with changes caused by the disease. General tips  Keep the person on track with his or her routine.  Try to identify areas where the person may need help.  Be supportive, patient, calm, and encouraging.  Gently remind the person that adjusting to changes takes time.  Help with the tasks that the person has asked for help with.  Keep the person involved in daily tasks and decisions as much as possible.  Encourage conversation, but try not to get frustrated or harried if the person struggles to find words or does not seem to appreciate your help. Communication tips  When the person is talking or seems frustrated, make eye contact and hold the person's hand.  Ask specific questions that need yes or no answers.  Use simple words, short sentences, and a calm voice. Only give one direction at a time.  When offering choices, limit them to just 1 or 2.  Avoid correcting the person in a negative way.  If the person is struggling to find the right words, gently try to help him or her.  How to recognize symptoms of stress Symptoms of stress in caregivers include:  Feeling frustrated or angry with the person with dementia.  Denying that the person has dementia or that his or her symptoms will not improve.  Feeling hopeless and unappreciated.  Difficulty sleeping.  Difficulty concentrating.  Feeling anxious, irritable, or depressed.  Developing stress-related health problems.  Feeling like you have too little time for your own life. Follow these instructions at home:   Make sure that you and the person you are caring for: ? Get regular sleep. ? Exercise regularly. ? Eat regular, nutritious meals. ? Drink enough fluid to keep your urine clear or pale yellow. ? Take over-the-counter and prescription medicines only as told by your health care providers. ? Attend all scheduled health care appointments.  Join a support group with others who are caregivers.  Ask about respite care resources so that you can have a regular break from the stress of caregiving.  Look for signs of stress in yourself and in the person you are caring for. If you notice signs of stress, take steps to manage it.  Consider any safety risks and take steps to avoid them.  Organize medications in a pill box for each day of the week.  Create a plan to handle any legal or financial matters. Get legal or financial advice if needed.  Keep a calendar in a central location to remind the person of appointments or other activities. Tips for reducing the risk of injury  Keep floors clear of  clutter. Remove rugs, magazine racks, and floor lamps.  Keep hallways well lit, especially at night.  Put a handrail and nonslip mat in the bathtub or shower.  Put childproof locks on cabinets that contain dangerous items, such as medicines, alcohol, guns, toxic cleaning items, sharp tools or utensils, matches, and lighters.  Put the locks in places where the person cannot see or reach them easily. This  will help ensure that the person does not wander out of the house and get lost.  Be prepared for emergencies. Keep a list of emergency phone numbers and addresses in a convenient area.  Remove car keys and lock garage doors so that the person does not try to get in the car and drive.  Have the person wear a bracelet that tracks locations and identifies the person as having memory problems. This should be worn at all times for safety. Where to find support: Many individuals and organizations offer support. These include:  Support groups for people with dementia and for caregivers.  Counselors or therapists.  Home health care services.  Adult day care centers. Where to find more information Alzheimer's Association: [employer] Contact a health care provider if:  The person's health is rapidly getting worse.  You are no longer able to care for the person.  Caring for the person is affecting your physical and emotional health.  The person threatens himself or herself, you, or anyone else. Summary  Dementia is a term used to describe a number of symptoms that affect memory and thinking.  Dementia usually gets worse slowly over time.  Take steps to reduce the person's risk of injury, and to plan for future care.  Caregivers need support, relief from caregiving, and time for their own lives. This information is not intended to replace advice given to you by your health care provider. Make sure you discuss any questions you have with your health care provider. Document Released: 12/10/2015 Document Revised: 12/18/2016 Document Reviewed: 12/10/2015 Elsevier Patient Education  2020 [employer].

## 2018-11-09 ENCOUNTER — Encounter: Payer: Self-pay | Admitting: Family Medicine

## 2018-11-09 ENCOUNTER — Other Ambulatory Visit: Payer: Self-pay

## 2018-11-09 ENCOUNTER — Ambulatory Visit (INDEPENDENT_AMBULATORY_CARE_PROVIDER_SITE_OTHER): Payer: Medicare HMO | Admitting: Family Medicine

## 2018-11-09 VITALS — BP 153/71 | HR 73 | Temp 98.0°F | Ht 62.0 in | Wt 116.8 lb

## 2018-11-09 DIAGNOSIS — F0390 Unspecified dementia without behavioral disturbance: Secondary | ICD-10-CM | POA: Diagnosis not present

## 2018-11-09 MED ORDER — MEMANTINE HCL 10 MG PO TABS: 10.0000 mg | ORAL_TABLET | Freq: Two times a day (BID) | ORAL | 12 refills | Status: DC

## 2018-11-09 MED ORDER — DONEPEZIL HCL 10 MG PO TABS: ORAL_TABLET | ORAL | 12 refills | Status: DC

## 2018-11-23 DIAGNOSIS — S32029A Unspecified fracture of second lumbar vertebra, initial encounter for closed fracture: Secondary | ICD-10-CM | POA: Diagnosis not present

## 2018-11-23 DIAGNOSIS — S32019A Unspecified fracture of first lumbar vertebra, initial encounter for closed fracture: Secondary | ICD-10-CM | POA: Diagnosis not present

## 2018-11-23 DIAGNOSIS — G8911 Acute pain due to trauma: Secondary | ICD-10-CM | POA: Diagnosis not present

## 2018-11-23 DIAGNOSIS — K5730 Diverticulosis of large intestine without perforation or abscess without bleeding: Secondary | ICD-10-CM | POA: Diagnosis not present

## 2018-11-23 DIAGNOSIS — S32038A Other fracture of third lumbar vertebra, initial encounter for closed fracture: Secondary | ICD-10-CM | POA: Diagnosis not present

## 2018-11-23 DIAGNOSIS — S2241XA Multiple fractures of ribs, right side, initial encounter for closed fracture: Secondary | ICD-10-CM | POA: Diagnosis not present

## 2018-11-23 DIAGNOSIS — S32040A Wedge compression fracture of fourth lumbar vertebra, initial encounter for closed fracture: Secondary | ICD-10-CM | POA: Diagnosis not present

## 2018-11-23 DIAGNOSIS — S32018A Other fracture of first lumbar vertebra, initial encounter for closed fracture: Secondary | ICD-10-CM | POA: Diagnosis not present

## 2018-11-23 DIAGNOSIS — S2243XA Multiple fractures of ribs, bilateral, initial encounter for closed fracture: Secondary | ICD-10-CM | POA: Diagnosis not present

## 2018-11-23 DIAGNOSIS — W06XXXA Fall from bed, initial encounter: Secondary | ICD-10-CM | POA: Diagnosis not present

## 2018-11-23 DIAGNOSIS — M810 Age-related osteoporosis without current pathological fracture: Secondary | ICD-10-CM | POA: Diagnosis not present

## 2018-11-23 DIAGNOSIS — K8020 Calculus of gallbladder without cholecystitis without obstruction: Secondary | ICD-10-CM | POA: Diagnosis not present

## 2018-11-23 DIAGNOSIS — S32009A Unspecified fracture of unspecified lumbar vertebra, initial encounter for closed fracture: Secondary | ICD-10-CM | POA: Diagnosis not present

## 2018-11-23 DIAGNOSIS — W19XXXD Unspecified fall, subsequent encounter: Secondary | ICD-10-CM | POA: Diagnosis not present

## 2018-11-23 DIAGNOSIS — M160 Bilateral primary osteoarthritis of hip: Secondary | ICD-10-CM | POA: Diagnosis not present

## 2018-11-23 DIAGNOSIS — M8008XD Age-related osteoporosis with current pathological fracture, vertebra(e), subsequent encounter for fracture with routine healing: Secondary | ICD-10-CM | POA: Diagnosis not present

## 2018-11-23 DIAGNOSIS — M5134 Other intervertebral disc degeneration, thoracic region: Secondary | ICD-10-CM | POA: Diagnosis not present

## 2018-11-23 DIAGNOSIS — S32028A Other fracture of second lumbar vertebra, initial encounter for closed fracture: Secondary | ICD-10-CM | POA: Diagnosis not present

## 2018-11-23 DIAGNOSIS — M4856XA Collapsed vertebra, not elsewhere classified, lumbar region, initial encounter for fracture: Secondary | ICD-10-CM | POA: Diagnosis not present

## 2018-11-23 DIAGNOSIS — S199XXA Unspecified injury of neck, initial encounter: Secondary | ICD-10-CM | POA: Diagnosis not present

## 2018-11-23 DIAGNOSIS — S3991XA Unspecified injury of abdomen, initial encounter: Secondary | ICD-10-CM | POA: Diagnosis not present

## 2018-11-23 DIAGNOSIS — S0990XA Unspecified injury of head, initial encounter: Secondary | ICD-10-CM | POA: Diagnosis not present

## 2018-11-23 DIAGNOSIS — Z043 Encounter for examination and observation following other accident: Secondary | ICD-10-CM | POA: Diagnosis not present

## 2018-11-23 DIAGNOSIS — S32039A Unspecified fracture of third lumbar vertebra, initial encounter for closed fracture: Secondary | ICD-10-CM | POA: Diagnosis not present

## 2018-11-23 DIAGNOSIS — S32042A Unstable burst fracture of fourth lumbar vertebra, initial encounter for closed fracture: Secondary | ICD-10-CM | POA: Diagnosis not present

## 2018-11-23 DIAGNOSIS — I700 Atherosclerosis of aorta: Secondary | ICD-10-CM | POA: Diagnosis not present

## 2018-11-23 DIAGNOSIS — I10 Essential (primary) hypertension: Secondary | ICD-10-CM | POA: Diagnosis not present

## 2018-11-23 DIAGNOSIS — M25551 Pain in right hip: Secondary | ICD-10-CM | POA: Diagnosis not present

## 2018-11-23 DIAGNOSIS — Y998 Other external cause status: Secondary | ICD-10-CM | POA: Diagnosis not present

## 2018-11-23 DIAGNOSIS — M545 Low back pain: Secondary | ICD-10-CM | POA: Diagnosis not present

## 2018-11-24 DIAGNOSIS — Z9181 History of falling: Secondary | ICD-10-CM | POA: Diagnosis not present

## 2018-11-24 DIAGNOSIS — S32039A Unspecified fracture of third lumbar vertebra, initial encounter for closed fracture: Secondary | ICD-10-CM | POA: Diagnosis not present

## 2018-11-24 DIAGNOSIS — S32019A Unspecified fracture of first lumbar vertebra, initial encounter for closed fracture: Secondary | ICD-10-CM | POA: Diagnosis not present

## 2018-11-24 DIAGNOSIS — S32029A Unspecified fracture of second lumbar vertebra, initial encounter for closed fracture: Secondary | ICD-10-CM | POA: Diagnosis not present

## 2018-11-24 DIAGNOSIS — S2241XA Multiple fractures of ribs, right side, initial encounter for closed fracture: Secondary | ICD-10-CM | POA: Diagnosis not present

## 2018-11-27 DIAGNOSIS — E7800 Pure hypercholesterolemia, unspecified: Secondary | ICD-10-CM | POA: Diagnosis not present

## 2018-11-27 DIAGNOSIS — S32019D Unspecified fracture of first lumbar vertebra, subsequent encounter for fracture with routine healing: Secondary | ICD-10-CM | POA: Diagnosis not present

## 2018-11-27 DIAGNOSIS — F0390 Unspecified dementia without behavioral disturbance: Secondary | ICD-10-CM | POA: Diagnosis not present

## 2018-11-27 DIAGNOSIS — F329 Major depressive disorder, single episode, unspecified: Secondary | ICD-10-CM | POA: Diagnosis not present

## 2018-11-27 DIAGNOSIS — S2241XD Multiple fractures of ribs, right side, subsequent encounter for fracture with routine healing: Secondary | ICD-10-CM | POA: Diagnosis not present

## 2018-11-27 DIAGNOSIS — S32029D Unspecified fracture of second lumbar vertebra, subsequent encounter for fracture with routine healing: Secondary | ICD-10-CM | POA: Diagnosis not present

## 2018-11-27 DIAGNOSIS — M160 Bilateral primary osteoarthritis of hip: Secondary | ICD-10-CM | POA: Diagnosis not present

## 2018-11-27 DIAGNOSIS — S32039D Unspecified fracture of third lumbar vertebra, subsequent encounter for fracture with routine healing: Secondary | ICD-10-CM | POA: Diagnosis not present

## 2018-11-27 DIAGNOSIS — I10 Essential (primary) hypertension: Secondary | ICD-10-CM | POA: Diagnosis not present

## 2018-12-07 DIAGNOSIS — S32009A Unspecified fracture of unspecified lumbar vertebra, initial encounter for closed fracture: Secondary | ICD-10-CM | POA: Diagnosis not present

## 2018-12-07 DIAGNOSIS — S32040A Wedge compression fracture of fourth lumbar vertebra, initial encounter for closed fracture: Secondary | ICD-10-CM | POA: Diagnosis not present

## 2018-12-07 DIAGNOSIS — W19XXXA Unspecified fall, initial encounter: Secondary | ICD-10-CM | POA: Diagnosis not present

## 2018-12-07 DIAGNOSIS — Y92009 Unspecified place in unspecified non-institutional (private) residence as the place of occurrence of the external cause: Secondary | ICD-10-CM | POA: Diagnosis not present

## 2018-12-08 ENCOUNTER — Telehealth: Payer: Self-pay | Admitting: Family Medicine

## 2018-12-08 DIAGNOSIS — F0390 Unspecified dementia without behavioral disturbance: Secondary | ICD-10-CM

## 2018-12-08 MED ORDER — MEMANTINE HCL 10 MG PO TABS: 10.0000 mg | ORAL_TABLET | Freq: Two times a day (BID) | ORAL | 12 refills | Status: DC

## 2018-12-08 MED ORDER — DONEPEZIL HCL 10 MG PO TABS: ORAL_TABLET | ORAL | 12 refills | Status: DC

## 2018-12-08 NOTE — Telephone Encounter (Signed)
escribed new prescriptions to [facility].

## 2018-12-08 NOTE — Telephone Encounter (Signed)
1) Medication(s) Requested (by name): memantine (NAMENDA) 10 MG tablet  donepezil (ARICEPT) 10 MG tablet  2) [facility]acy [facility] Choice: [facility] [phone] 3) Special Requests:   kia Called from [facility] that they are switching pharmacys and need these medications transferred to [facility]. Please follow up

## 2018-12-11 DIAGNOSIS — R54 Age-related physical debility: Secondary | ICD-10-CM | POA: Diagnosis not present

## 2018-12-11 DIAGNOSIS — F339 Major depressive disorder, recurrent, unspecified: Secondary | ICD-10-CM | POA: Diagnosis not present

## 2018-12-11 DIAGNOSIS — F0390 Unspecified dementia without behavioral disturbance: Secondary | ICD-10-CM | POA: Diagnosis not present

## 2018-12-11 DIAGNOSIS — M8580 Other specified disorders of bone density and structure, unspecified site: Secondary | ICD-10-CM | POA: Diagnosis not present

## 2018-12-11 DIAGNOSIS — I10 Essential (primary) hypertension: Secondary | ICD-10-CM | POA: Diagnosis not present

## 2018-12-11 DIAGNOSIS — E782 Mixed hyperlipidemia: Secondary | ICD-10-CM | POA: Diagnosis not present

## 2019-01-17 DIAGNOSIS — E538 Deficiency of other specified B group vitamins: Secondary | ICD-10-CM | POA: Diagnosis not present

## 2019-01-18 DIAGNOSIS — I10 Essential (primary) hypertension: Secondary | ICD-10-CM | POA: Diagnosis not present

## 2019-01-18 DIAGNOSIS — S32019D Unspecified fracture of first lumbar vertebra, subsequent encounter for fracture with routine healing: Secondary | ICD-10-CM | POA: Diagnosis not present

## 2019-01-18 DIAGNOSIS — F0390 Unspecified dementia without behavioral disturbance: Secondary | ICD-10-CM | POA: Diagnosis not present

## 2019-01-18 DIAGNOSIS — S32029D Unspecified fracture of second lumbar vertebra, subsequent encounter for fracture with routine healing: Secondary | ICD-10-CM | POA: Diagnosis not present

## 2019-01-18 DIAGNOSIS — S2241XD Multiple fractures of ribs, right side, subsequent encounter for fracture with routine healing: Secondary | ICD-10-CM | POA: Diagnosis not present

## 2019-01-18 DIAGNOSIS — S32039D Unspecified fracture of third lumbar vertebra, subsequent encounter for fracture with routine healing: Secondary | ICD-10-CM | POA: Diagnosis not present

## 2019-01-18 DIAGNOSIS — F329 Major depressive disorder, single episode, unspecified: Secondary | ICD-10-CM | POA: Diagnosis not present

## 2019-01-18 DIAGNOSIS — M160 Bilateral primary osteoarthritis of hip: Secondary | ICD-10-CM | POA: Diagnosis not present

## 2019-01-18 DIAGNOSIS — E7800 Pure hypercholesterolemia, unspecified: Secondary | ICD-10-CM | POA: Diagnosis not present

## 2019-02-03 DIAGNOSIS — I071 Rheumatic tricuspid insufficiency: Secondary | ICD-10-CM | POA: Diagnosis not present

## 2019-02-03 DIAGNOSIS — I2694 Multiple subsegmental pulmonary emboli without acute cor pulmonale: Secondary | ICD-10-CM | POA: Diagnosis not present

## 2019-02-03 DIAGNOSIS — J984 Other disorders of lung: Secondary | ICD-10-CM | POA: Diagnosis not present

## 2019-02-03 DIAGNOSIS — J811 Chronic pulmonary edema: Secondary | ICD-10-CM | POA: Diagnosis not present

## 2019-02-03 DIAGNOSIS — I519 Heart disease, unspecified: Secondary | ICD-10-CM | POA: Diagnosis not present

## 2019-02-03 DIAGNOSIS — I824Y1 Acute embolism and thrombosis of unspecified deep veins of right proximal lower extremity: Secondary | ICD-10-CM | POA: Diagnosis not present

## 2019-02-03 DIAGNOSIS — I10 Essential (primary) hypertension: Secondary | ICD-10-CM | POA: Diagnosis not present

## 2019-02-03 DIAGNOSIS — E785 Hyperlipidemia, unspecified: Secondary | ICD-10-CM | POA: Diagnosis not present

## 2019-02-03 DIAGNOSIS — F0390 Unspecified dementia without behavioral disturbance: Secondary | ICD-10-CM | POA: Diagnosis not present

## 2019-02-03 DIAGNOSIS — J9811 Atelectasis: Secondary | ICD-10-CM | POA: Diagnosis not present

## 2019-02-03 DIAGNOSIS — I700 Atherosclerosis of aorta: Secondary | ICD-10-CM | POA: Diagnosis not present

## 2019-02-03 DIAGNOSIS — I371 Nonrheumatic pulmonary valve insufficiency: Secondary | ICD-10-CM | POA: Diagnosis not present

## 2019-02-03 DIAGNOSIS — I2609 Other pulmonary embolism with acute cor pulmonale: Secondary | ICD-10-CM | POA: Diagnosis not present

## 2019-02-03 DIAGNOSIS — Z20822 Contact with and (suspected) exposure to covid-19: Secondary | ICD-10-CM | POA: Diagnosis not present

## 2019-02-03 DIAGNOSIS — J90 Pleural effusion, not elsewhere classified: Secondary | ICD-10-CM | POA: Diagnosis not present

## 2019-02-03 DIAGNOSIS — R0602 Shortness of breath: Secondary | ICD-10-CM | POA: Diagnosis not present

## 2019-02-03 DIAGNOSIS — I82431 Acute embolism and thrombosis of right popliteal vein: Secondary | ICD-10-CM | POA: Diagnosis not present

## 2019-02-03 DIAGNOSIS — Z79899 Other long term (current) drug therapy: Secondary | ICD-10-CM | POA: Diagnosis not present

## 2019-02-03 DIAGNOSIS — I517 Cardiomegaly: Secondary | ICD-10-CM | POA: Diagnosis not present

## 2019-02-03 DIAGNOSIS — I82441 Acute embolism and thrombosis of right tibial vein: Secondary | ICD-10-CM | POA: Diagnosis not present

## 2019-02-03 DIAGNOSIS — I2699 Other pulmonary embolism without acute cor pulmonale: Secondary | ICD-10-CM | POA: Diagnosis not present

## 2019-02-03 DIAGNOSIS — R778 Other specified abnormalities of plasma proteins: Secondary | ICD-10-CM | POA: Diagnosis not present

## 2019-02-03 DIAGNOSIS — I82411 Acute embolism and thrombosis of right femoral vein: Secondary | ICD-10-CM | POA: Diagnosis not present

## 2019-02-09 DIAGNOSIS — I119 Hypertensive heart disease without heart failure: Secondary | ICD-10-CM | POA: Diagnosis not present

## 2019-02-09 DIAGNOSIS — I2510 Atherosclerotic heart disease of native coronary artery without angina pectoris: Secondary | ICD-10-CM | POA: Diagnosis not present

## 2019-02-09 DIAGNOSIS — I82411 Acute embolism and thrombosis of right femoral vein: Secondary | ICD-10-CM | POA: Diagnosis not present

## 2019-02-09 DIAGNOSIS — S32019D Unspecified fracture of first lumbar vertebra, subsequent encounter for fracture with routine healing: Secondary | ICD-10-CM | POA: Diagnosis not present

## 2019-02-09 DIAGNOSIS — I82431 Acute embolism and thrombosis of right popliteal vein: Secondary | ICD-10-CM | POA: Diagnosis not present

## 2019-02-09 DIAGNOSIS — S2241XD Multiple fractures of ribs, right side, subsequent encounter for fracture with routine healing: Secondary | ICD-10-CM | POA: Diagnosis not present

## 2019-02-09 DIAGNOSIS — F0390 Unspecified dementia without behavioral disturbance: Secondary | ICD-10-CM | POA: Diagnosis not present

## 2019-02-09 DIAGNOSIS — I82441 Acute embolism and thrombosis of right tibial vein: Secondary | ICD-10-CM | POA: Diagnosis not present

## 2019-02-09 DIAGNOSIS — I2609 Other pulmonary embolism with acute cor pulmonale: Secondary | ICD-10-CM | POA: Diagnosis not present

## 2019-02-13 DIAGNOSIS — R54 Age-related physical debility: Secondary | ICD-10-CM | POA: Diagnosis not present

## 2019-02-13 DIAGNOSIS — I82431 Acute embolism and thrombosis of right popliteal vein: Secondary | ICD-10-CM | POA: Diagnosis not present

## 2019-02-13 DIAGNOSIS — M8580 Other specified disorders of bone density and structure, unspecified site: Secondary | ICD-10-CM | POA: Diagnosis not present

## 2019-02-13 DIAGNOSIS — I2510 Atherosclerotic heart disease of native coronary artery without angina pectoris: Secondary | ICD-10-CM | POA: Diagnosis not present

## 2019-02-13 DIAGNOSIS — F339 Major depressive disorder, recurrent, unspecified: Secondary | ICD-10-CM | POA: Diagnosis not present

## 2019-02-13 DIAGNOSIS — I10 Essential (primary) hypertension: Secondary | ICD-10-CM | POA: Diagnosis not present

## 2019-02-13 DIAGNOSIS — F0390 Unspecified dementia without behavioral disturbance: Secondary | ICD-10-CM | POA: Diagnosis not present

## 2019-02-13 DIAGNOSIS — I119 Hypertensive heart disease without heart failure: Secondary | ICD-10-CM | POA: Diagnosis not present

## 2019-02-13 DIAGNOSIS — S32019D Unspecified fracture of first lumbar vertebra, subsequent encounter for fracture with routine healing: Secondary | ICD-10-CM | POA: Diagnosis not present

## 2019-02-13 DIAGNOSIS — I82411 Acute embolism and thrombosis of right femoral vein: Secondary | ICD-10-CM | POA: Diagnosis not present

## 2019-02-13 DIAGNOSIS — I2609 Other pulmonary embolism with acute cor pulmonale: Secondary | ICD-10-CM | POA: Diagnosis not present

## 2019-02-13 DIAGNOSIS — E782 Mixed hyperlipidemia: Secondary | ICD-10-CM | POA: Diagnosis not present

## 2019-02-13 DIAGNOSIS — I82441 Acute embolism and thrombosis of right tibial vein: Secondary | ICD-10-CM | POA: Diagnosis not present

## 2019-02-13 DIAGNOSIS — S2241XD Multiple fractures of ribs, right side, subsequent encounter for fracture with routine healing: Secondary | ICD-10-CM | POA: Diagnosis not present

## 2019-02-14 DIAGNOSIS — R638 Other symptoms and signs concerning food and fluid intake: Secondary | ICD-10-CM | POA: Diagnosis not present

## 2019-02-14 DIAGNOSIS — F0390 Unspecified dementia without behavioral disturbance: Secondary | ICD-10-CM | POA: Diagnosis not present

## 2019-02-14 DIAGNOSIS — E559 Vitamin D deficiency, unspecified: Secondary | ICD-10-CM | POA: Diagnosis not present

## 2019-02-14 DIAGNOSIS — I2699 Other pulmonary embolism without acute cor pulmonale: Secondary | ICD-10-CM | POA: Diagnosis not present

## 2019-02-14 DIAGNOSIS — Z9981 Dependence on supplemental oxygen: Secondary | ICD-10-CM | POA: Diagnosis not present

## 2019-02-14 DIAGNOSIS — Z7901 Long term (current) use of anticoagulants: Secondary | ICD-10-CM | POA: Diagnosis not present

## 2019-02-14 DIAGNOSIS — I82409 Acute embolism and thrombosis of unspecified deep veins of unspecified lower extremity: Secondary | ICD-10-CM | POA: Diagnosis not present

## 2019-02-14 DIAGNOSIS — R0902 Hypoxemia: Secondary | ICD-10-CM | POA: Diagnosis not present

## 2019-02-14 DIAGNOSIS — I10 Essential (primary) hypertension: Secondary | ICD-10-CM | POA: Diagnosis not present

## 2019-02-17 DIAGNOSIS — I82431 Acute embolism and thrombosis of right popliteal vein: Secondary | ICD-10-CM | POA: Diagnosis not present

## 2019-02-17 DIAGNOSIS — I2510 Atherosclerotic heart disease of native coronary artery without angina pectoris: Secondary | ICD-10-CM | POA: Diagnosis not present

## 2019-02-17 DIAGNOSIS — I82441 Acute embolism and thrombosis of right tibial vein: Secondary | ICD-10-CM | POA: Diagnosis not present

## 2019-02-17 DIAGNOSIS — S32019D Unspecified fracture of first lumbar vertebra, subsequent encounter for fracture with routine healing: Secondary | ICD-10-CM | POA: Diagnosis not present

## 2019-02-17 DIAGNOSIS — I119 Hypertensive heart disease without heart failure: Secondary | ICD-10-CM | POA: Diagnosis not present

## 2019-02-17 DIAGNOSIS — I2609 Other pulmonary embolism with acute cor pulmonale: Secondary | ICD-10-CM | POA: Diagnosis not present

## 2019-02-17 DIAGNOSIS — F0390 Unspecified dementia without behavioral disturbance: Secondary | ICD-10-CM | POA: Diagnosis not present

## 2019-02-17 DIAGNOSIS — S2241XD Multiple fractures of ribs, right side, subsequent encounter for fracture with routine healing: Secondary | ICD-10-CM | POA: Diagnosis not present

## 2019-02-17 DIAGNOSIS — I82411 Acute embolism and thrombosis of right femoral vein: Secondary | ICD-10-CM | POA: Diagnosis not present

## 2019-02-20 DIAGNOSIS — S32019D Unspecified fracture of first lumbar vertebra, subsequent encounter for fracture with routine healing: Secondary | ICD-10-CM | POA: Diagnosis not present

## 2019-02-20 DIAGNOSIS — I82431 Acute embolism and thrombosis of right popliteal vein: Secondary | ICD-10-CM | POA: Diagnosis not present

## 2019-02-20 DIAGNOSIS — I82411 Acute embolism and thrombosis of right femoral vein: Secondary | ICD-10-CM | POA: Diagnosis not present

## 2019-02-20 DIAGNOSIS — F0390 Unspecified dementia without behavioral disturbance: Secondary | ICD-10-CM | POA: Diagnosis not present

## 2019-02-20 DIAGNOSIS — S2241XD Multiple fractures of ribs, right side, subsequent encounter for fracture with routine healing: Secondary | ICD-10-CM | POA: Diagnosis not present

## 2019-02-20 DIAGNOSIS — I2510 Atherosclerotic heart disease of native coronary artery without angina pectoris: Secondary | ICD-10-CM | POA: Diagnosis not present

## 2019-02-20 DIAGNOSIS — I119 Hypertensive heart disease without heart failure: Secondary | ICD-10-CM | POA: Diagnosis not present

## 2019-02-20 DIAGNOSIS — I82441 Acute embolism and thrombosis of right tibial vein: Secondary | ICD-10-CM | POA: Diagnosis not present

## 2019-02-20 DIAGNOSIS — I2609 Other pulmonary embolism with acute cor pulmonale: Secondary | ICD-10-CM | POA: Diagnosis not present

## 2019-02-23 DIAGNOSIS — I82441 Acute embolism and thrombosis of right tibial vein: Secondary | ICD-10-CM | POA: Diagnosis not present

## 2019-02-23 DIAGNOSIS — I2609 Other pulmonary embolism with acute cor pulmonale: Secondary | ICD-10-CM | POA: Diagnosis not present

## 2019-02-23 DIAGNOSIS — S32019D Unspecified fracture of first lumbar vertebra, subsequent encounter for fracture with routine healing: Secondary | ICD-10-CM | POA: Diagnosis not present

## 2019-02-23 DIAGNOSIS — I119 Hypertensive heart disease without heart failure: Secondary | ICD-10-CM | POA: Diagnosis not present

## 2019-02-23 DIAGNOSIS — I2510 Atherosclerotic heart disease of native coronary artery without angina pectoris: Secondary | ICD-10-CM | POA: Diagnosis not present

## 2019-02-23 DIAGNOSIS — F0390 Unspecified dementia without behavioral disturbance: Secondary | ICD-10-CM | POA: Diagnosis not present

## 2019-02-23 DIAGNOSIS — I82411 Acute embolism and thrombosis of right femoral vein: Secondary | ICD-10-CM | POA: Diagnosis not present

## 2019-02-23 DIAGNOSIS — S2241XD Multiple fractures of ribs, right side, subsequent encounter for fracture with routine healing: Secondary | ICD-10-CM | POA: Diagnosis not present

## 2019-02-23 DIAGNOSIS — I82431 Acute embolism and thrombosis of right popliteal vein: Secondary | ICD-10-CM | POA: Diagnosis not present

## 2019-02-27 DIAGNOSIS — F0390 Unspecified dementia without behavioral disturbance: Secondary | ICD-10-CM | POA: Diagnosis not present

## 2019-02-27 DIAGNOSIS — S32019D Unspecified fracture of first lumbar vertebra, subsequent encounter for fracture with routine healing: Secondary | ICD-10-CM | POA: Diagnosis not present

## 2019-02-27 DIAGNOSIS — I2609 Other pulmonary embolism with acute cor pulmonale: Secondary | ICD-10-CM | POA: Diagnosis not present

## 2019-02-27 DIAGNOSIS — I2510 Atherosclerotic heart disease of native coronary artery without angina pectoris: Secondary | ICD-10-CM | POA: Diagnosis not present

## 2019-02-27 DIAGNOSIS — I82441 Acute embolism and thrombosis of right tibial vein: Secondary | ICD-10-CM | POA: Diagnosis not present

## 2019-02-27 DIAGNOSIS — I82431 Acute embolism and thrombosis of right popliteal vein: Secondary | ICD-10-CM | POA: Diagnosis not present

## 2019-02-27 DIAGNOSIS — I119 Hypertensive heart disease without heart failure: Secondary | ICD-10-CM | POA: Diagnosis not present

## 2019-02-27 DIAGNOSIS — I82411 Acute embolism and thrombosis of right femoral vein: Secondary | ICD-10-CM | POA: Diagnosis not present

## 2019-02-27 DIAGNOSIS — S2241XD Multiple fractures of ribs, right side, subsequent encounter for fracture with routine healing: Secondary | ICD-10-CM | POA: Diagnosis not present

## 2019-03-02 DIAGNOSIS — Z7901 Long term (current) use of anticoagulants: Secondary | ICD-10-CM | POA: Diagnosis not present

## 2019-03-02 DIAGNOSIS — E878 Other disorders of electrolyte and fluid balance, not elsewhere classified: Secondary | ICD-10-CM | POA: Diagnosis not present

## 2019-03-02 DIAGNOSIS — I2699 Other pulmonary embolism without acute cor pulmonale: Secondary | ICD-10-CM | POA: Diagnosis not present

## 2019-03-02 DIAGNOSIS — E538 Deficiency of other specified B group vitamins: Secondary | ICD-10-CM | POA: Diagnosis not present

## 2019-03-03 DIAGNOSIS — S2241XD Multiple fractures of ribs, right side, subsequent encounter for fracture with routine healing: Secondary | ICD-10-CM | POA: Diagnosis not present

## 2019-03-03 DIAGNOSIS — I2510 Atherosclerotic heart disease of native coronary artery without angina pectoris: Secondary | ICD-10-CM | POA: Diagnosis not present

## 2019-03-03 DIAGNOSIS — F0390 Unspecified dementia without behavioral disturbance: Secondary | ICD-10-CM | POA: Diagnosis not present

## 2019-03-03 DIAGNOSIS — S32019D Unspecified fracture of first lumbar vertebra, subsequent encounter for fracture with routine healing: Secondary | ICD-10-CM | POA: Diagnosis not present

## 2019-03-03 DIAGNOSIS — I82411 Acute embolism and thrombosis of right femoral vein: Secondary | ICD-10-CM | POA: Diagnosis not present

## 2019-03-03 DIAGNOSIS — I82431 Acute embolism and thrombosis of right popliteal vein: Secondary | ICD-10-CM | POA: Diagnosis not present

## 2019-03-03 DIAGNOSIS — I82441 Acute embolism and thrombosis of right tibial vein: Secondary | ICD-10-CM | POA: Diagnosis not present

## 2019-03-03 DIAGNOSIS — I2609 Other pulmonary embolism with acute cor pulmonale: Secondary | ICD-10-CM | POA: Diagnosis not present

## 2019-03-03 DIAGNOSIS — I119 Hypertensive heart disease without heart failure: Secondary | ICD-10-CM | POA: Diagnosis not present

## 2019-03-06 DIAGNOSIS — E871 Hypo-osmolality and hyponatremia: Secondary | ICD-10-CM | POA: Diagnosis not present

## 2019-03-06 DIAGNOSIS — D649 Anemia, unspecified: Secondary | ICD-10-CM | POA: Diagnosis not present

## 2019-03-07 DIAGNOSIS — S32019D Unspecified fracture of first lumbar vertebra, subsequent encounter for fracture with routine healing: Secondary | ICD-10-CM | POA: Diagnosis not present

## 2019-03-07 DIAGNOSIS — I82431 Acute embolism and thrombosis of right popliteal vein: Secondary | ICD-10-CM | POA: Diagnosis not present

## 2019-03-07 DIAGNOSIS — I2609 Other pulmonary embolism with acute cor pulmonale: Secondary | ICD-10-CM | POA: Diagnosis not present

## 2019-03-07 DIAGNOSIS — I82411 Acute embolism and thrombosis of right femoral vein: Secondary | ICD-10-CM | POA: Diagnosis not present

## 2019-03-07 DIAGNOSIS — F0390 Unspecified dementia without behavioral disturbance: Secondary | ICD-10-CM | POA: Diagnosis not present

## 2019-03-07 DIAGNOSIS — S2241XD Multiple fractures of ribs, right side, subsequent encounter for fracture with routine healing: Secondary | ICD-10-CM | POA: Diagnosis not present

## 2019-03-07 DIAGNOSIS — I2510 Atherosclerotic heart disease of native coronary artery without angina pectoris: Secondary | ICD-10-CM | POA: Diagnosis not present

## 2019-03-07 DIAGNOSIS — I119 Hypertensive heart disease without heart failure: Secondary | ICD-10-CM | POA: Diagnosis not present

## 2019-03-07 DIAGNOSIS — I82441 Acute embolism and thrombosis of right tibial vein: Secondary | ICD-10-CM | POA: Diagnosis not present

## 2019-03-10 DIAGNOSIS — Z20822 Contact with and (suspected) exposure to covid-19: Secondary | ICD-10-CM | POA: Diagnosis not present

## 2019-03-10 DIAGNOSIS — I82441 Acute embolism and thrombosis of right tibial vein: Secondary | ICD-10-CM | POA: Diagnosis not present

## 2019-03-10 DIAGNOSIS — F339 Major depressive disorder, recurrent, unspecified: Secondary | ICD-10-CM | POA: Diagnosis not present

## 2019-03-10 DIAGNOSIS — R918 Other nonspecific abnormal finding of lung field: Secondary | ICD-10-CM | POA: Diagnosis not present

## 2019-03-10 DIAGNOSIS — N3000 Acute cystitis without hematuria: Secondary | ICD-10-CM | POA: Diagnosis not present

## 2019-03-10 DIAGNOSIS — A419 Sepsis, unspecified organism: Secondary | ICD-10-CM | POA: Diagnosis not present

## 2019-03-10 DIAGNOSIS — Z7901 Long term (current) use of anticoagulants: Secondary | ICD-10-CM | POA: Diagnosis not present

## 2019-03-10 DIAGNOSIS — Z86711 Personal history of pulmonary embolism: Secondary | ICD-10-CM | POA: Diagnosis not present

## 2019-03-10 DIAGNOSIS — E7800 Pure hypercholesterolemia, unspecified: Secondary | ICD-10-CM | POA: Diagnosis not present

## 2019-03-10 DIAGNOSIS — Z86718 Personal history of other venous thrombosis and embolism: Secondary | ICD-10-CM | POA: Diagnosis not present

## 2019-03-10 DIAGNOSIS — I82411 Acute embolism and thrombosis of right femoral vein: Secondary | ICD-10-CM | POA: Diagnosis not present

## 2019-03-10 DIAGNOSIS — D539 Nutritional anemia, unspecified: Secondary | ICD-10-CM | POA: Diagnosis not present

## 2019-03-10 DIAGNOSIS — F0390 Unspecified dementia without behavioral disturbance: Secondary | ICD-10-CM | POA: Diagnosis not present

## 2019-03-10 DIAGNOSIS — R410 Disorientation, unspecified: Secondary | ICD-10-CM | POA: Diagnosis not present

## 2019-03-10 DIAGNOSIS — I2510 Atherosclerotic heart disease of native coronary artery without angina pectoris: Secondary | ICD-10-CM | POA: Diagnosis not present

## 2019-03-10 DIAGNOSIS — I10 Essential (primary) hypertension: Secondary | ICD-10-CM | POA: Diagnosis not present

## 2019-03-10 DIAGNOSIS — E782 Mixed hyperlipidemia: Secondary | ICD-10-CM | POA: Diagnosis not present

## 2019-03-10 DIAGNOSIS — I2609 Other pulmonary embolism with acute cor pulmonale: Secondary | ICD-10-CM | POA: Diagnosis not present

## 2019-03-10 DIAGNOSIS — M8580 Other specified disorders of bone density and structure, unspecified site: Secondary | ICD-10-CM | POA: Diagnosis not present

## 2019-03-10 DIAGNOSIS — I119 Hypertensive heart disease without heart failure: Secondary | ICD-10-CM | POA: Diagnosis not present

## 2019-03-10 DIAGNOSIS — I2699 Other pulmonary embolism without acute cor pulmonale: Secondary | ICD-10-CM | POA: Diagnosis not present

## 2019-03-10 DIAGNOSIS — S32019D Unspecified fracture of first lumbar vertebra, subsequent encounter for fracture with routine healing: Secondary | ICD-10-CM | POA: Diagnosis not present

## 2019-03-10 DIAGNOSIS — S2241XD Multiple fractures of ribs, right side, subsequent encounter for fracture with routine healing: Secondary | ICD-10-CM | POA: Diagnosis not present

## 2019-03-10 DIAGNOSIS — I82431 Acute embolism and thrombosis of right popliteal vein: Secondary | ICD-10-CM | POA: Diagnosis not present

## 2019-03-10 DIAGNOSIS — R54 Age-related physical debility: Secondary | ICD-10-CM | POA: Diagnosis not present

## 2019-03-10 DIAGNOSIS — R509 Fever, unspecified: Secondary | ICD-10-CM | POA: Diagnosis not present

## 2019-03-11 DIAGNOSIS — I82411 Acute embolism and thrombosis of right femoral vein: Secondary | ICD-10-CM | POA: Diagnosis not present

## 2019-03-11 DIAGNOSIS — I82441 Acute embolism and thrombosis of right tibial vein: Secondary | ICD-10-CM | POA: Diagnosis not present

## 2019-03-11 DIAGNOSIS — A419 Sepsis, unspecified organism: Secondary | ICD-10-CM | POA: Diagnosis not present

## 2019-03-11 DIAGNOSIS — I119 Hypertensive heart disease without heart failure: Secondary | ICD-10-CM | POA: Diagnosis not present

## 2019-03-11 DIAGNOSIS — I2609 Other pulmonary embolism with acute cor pulmonale: Secondary | ICD-10-CM | POA: Diagnosis not present

## 2019-03-11 DIAGNOSIS — I2699 Other pulmonary embolism without acute cor pulmonale: Secondary | ICD-10-CM | POA: Diagnosis not present

## 2019-03-11 DIAGNOSIS — I10 Essential (primary) hypertension: Secondary | ICD-10-CM | POA: Diagnosis not present

## 2019-03-11 DIAGNOSIS — S32019D Unspecified fracture of first lumbar vertebra, subsequent encounter for fracture with routine healing: Secondary | ICD-10-CM | POA: Diagnosis not present

## 2019-03-11 DIAGNOSIS — F0390 Unspecified dementia without behavioral disturbance: Secondary | ICD-10-CM | POA: Diagnosis not present

## 2019-03-11 DIAGNOSIS — I82431 Acute embolism and thrombosis of right popliteal vein: Secondary | ICD-10-CM | POA: Diagnosis not present

## 2019-03-11 DIAGNOSIS — I2510 Atherosclerotic heart disease of native coronary artery without angina pectoris: Secondary | ICD-10-CM | POA: Diagnosis not present

## 2019-03-11 DIAGNOSIS — S2241XD Multiple fractures of ribs, right side, subsequent encounter for fracture with routine healing: Secondary | ICD-10-CM | POA: Diagnosis not present

## 2019-03-13 DIAGNOSIS — A419 Sepsis, unspecified organism: Secondary | ICD-10-CM | POA: Diagnosis not present

## 2019-03-13 DIAGNOSIS — I2699 Other pulmonary embolism without acute cor pulmonale: Secondary | ICD-10-CM | POA: Diagnosis not present

## 2019-03-13 DIAGNOSIS — I10 Essential (primary) hypertension: Secondary | ICD-10-CM | POA: Diagnosis not present

## 2019-03-13 DIAGNOSIS — F0390 Unspecified dementia without behavioral disturbance: Secondary | ICD-10-CM | POA: Diagnosis not present

## 2019-03-13 DIAGNOSIS — N3000 Acute cystitis without hematuria: Secondary | ICD-10-CM | POA: Diagnosis not present

## 2019-03-14 DIAGNOSIS — N3000 Acute cystitis without hematuria: Secondary | ICD-10-CM | POA: Diagnosis not present

## 2019-03-14 DIAGNOSIS — A419 Sepsis, unspecified organism: Secondary | ICD-10-CM | POA: Diagnosis not present

## 2019-03-14 DIAGNOSIS — I10 Essential (primary) hypertension: Secondary | ICD-10-CM | POA: Diagnosis not present

## 2019-03-14 DIAGNOSIS — F0390 Unspecified dementia without behavioral disturbance: Secondary | ICD-10-CM | POA: Diagnosis not present

## 2019-03-14 DIAGNOSIS — Z515 Encounter for palliative care: Secondary | ICD-10-CM | POA: Diagnosis not present

## 2019-03-14 DIAGNOSIS — R339 Retention of urine, unspecified: Secondary | ICD-10-CM | POA: Diagnosis not present

## 2019-03-14 DIAGNOSIS — R269 Unspecified abnormalities of gait and mobility: Secondary | ICD-10-CM | POA: Diagnosis not present

## 2019-03-14 DIAGNOSIS — I2699 Other pulmonary embolism without acute cor pulmonale: Secondary | ICD-10-CM | POA: Diagnosis not present

## 2019-03-15 DIAGNOSIS — A419 Sepsis, unspecified organism: Secondary | ICD-10-CM | POA: Diagnosis not present

## 2019-03-15 DIAGNOSIS — F0390 Unspecified dementia without behavioral disturbance: Secondary | ICD-10-CM | POA: Diagnosis not present

## 2019-03-15 DIAGNOSIS — N3000 Acute cystitis without hematuria: Secondary | ICD-10-CM | POA: Diagnosis not present

## 2019-03-15 DIAGNOSIS — I10 Essential (primary) hypertension: Secondary | ICD-10-CM | POA: Diagnosis not present

## 2019-03-15 DIAGNOSIS — I2699 Other pulmonary embolism without acute cor pulmonale: Secondary | ICD-10-CM | POA: Diagnosis not present

## 2019-03-17 ENCOUNTER — Telehealth: Payer: Self-pay | Admitting: Hematology

## 2019-03-17 MED ORDER — CHOLECALCIFEROL 25 MCG (1000 UT) PO TABS
1000.00 | ORAL_TABLET | ORAL | Status: DC
Start: 2019-03-16 — End: 2019-03-17

## 2019-03-17 MED ORDER — LISINOPRIL 10 MG PO TABS
20.00 | ORAL_TABLET | ORAL | Status: DC
Start: 2019-03-16 — End: 2019-03-17

## 2019-03-17 MED ORDER — CLOTRIMAZOLE 1 % EX CREA
TOPICAL_CREAM | CUTANEOUS | Status: DC
Start: 2019-03-15 — End: 2019-03-17

## 2019-03-17 MED ORDER — SODIUM CHLORIDE 0.9 % IV SOLN
10.00 | INTRAVENOUS | Status: DC
Start: ? — End: 2019-03-17

## 2019-03-17 MED ORDER — DONEPEZIL HCL 5 MG PO TABS
10.00 | ORAL_TABLET | ORAL | Status: DC
Start: 2019-03-15 — End: 2019-03-17

## 2019-03-17 MED ORDER — APIXABAN 5 MG PO TABS
5.00 | ORAL_TABLET | ORAL | Status: DC
Start: 2019-03-15 — End: 2019-03-17

## 2019-03-17 MED ORDER — CYANOCOBALAMIN 1000 MCG PO TABS
1000.00 | ORAL_TABLET | ORAL | Status: DC
Start: 2019-03-16 — End: 2019-03-17

## 2019-03-17 MED ORDER — GENERIC EXTERNAL MEDICATION
Status: DC
Start: ? — End: 2019-03-17

## 2019-03-17 MED ORDER — GENERIC EXTERNAL MEDICATION
3.38 | Status: DC
Start: 2019-03-15 — End: 2019-03-17

## 2019-03-17 MED ORDER — SERTRALINE HCL 50 MG PO TABS
50.00 | ORAL_TABLET | ORAL | Status: DC
Start: 2019-03-16 — End: 2019-03-17

## 2019-03-17 MED ORDER — POLYVINYL ALCOHOL 1.4 % OP SOLN
1.00 | OPHTHALMIC | Status: DC
Start: ? — End: 2019-03-17

## 2019-03-17 MED ORDER — ACETAMINOPHEN 325 MG PO TABS
650.00 | ORAL_TABLET | ORAL | Status: DC
Start: ? — End: 2019-03-17

## 2019-03-17 MED ORDER — MEMANTINE HCL 10 MG PO TABS
10.00 | ORAL_TABLET | ORAL | Status: DC
Start: 2019-03-15 — End: 2019-03-17

## 2019-03-17 MED ORDER — ASPIRIN 81 MG PO TBEC
81.00 | DELAYED_RELEASE_TABLET | ORAL | Status: DC
Start: 2019-03-16 — End: 2019-03-17

## 2019-03-17 MED ORDER — PRAVASTATIN SODIUM 10 MG PO TABS
20.00 | ORAL_TABLET | ORAL | Status: DC
Start: 2019-03-15 — End: 2019-03-17

## 2019-03-17 NOTE — Telephone Encounter (Signed)
Returned patient's phone call regarding rescheduling an appointment, left a voicemail. 

## 2019-03-20 ENCOUNTER — Encounter: Payer: Medicare HMO | Admitting: Hematology

## 2019-03-20 ENCOUNTER — Ambulatory Visit: Payer: Medicare HMO

## 2019-03-20 DIAGNOSIS — Z7901 Long term (current) use of anticoagulants: Secondary | ICD-10-CM | POA: Diagnosis not present

## 2019-03-20 DIAGNOSIS — E860 Dehydration: Secondary | ICD-10-CM | POA: Diagnosis not present

## 2019-03-20 DIAGNOSIS — Z978 Presence of other specified devices: Secondary | ICD-10-CM | POA: Diagnosis not present

## 2019-03-21 DIAGNOSIS — F0390 Unspecified dementia without behavioral disturbance: Secondary | ICD-10-CM | POA: Diagnosis not present

## 2019-03-21 DIAGNOSIS — I82441 Acute embolism and thrombosis of right tibial vein: Secondary | ICD-10-CM | POA: Diagnosis not present

## 2019-03-21 DIAGNOSIS — R338 Other retention of urine: Secondary | ICD-10-CM | POA: Diagnosis not present

## 2019-03-21 DIAGNOSIS — I82411 Acute embolism and thrombosis of right femoral vein: Secondary | ICD-10-CM | POA: Diagnosis not present

## 2019-03-21 DIAGNOSIS — S2241XD Multiple fractures of ribs, right side, subsequent encounter for fracture with routine healing: Secondary | ICD-10-CM | POA: Diagnosis not present

## 2019-03-21 DIAGNOSIS — S32019D Unspecified fracture of first lumbar vertebra, subsequent encounter for fracture with routine healing: Secondary | ICD-10-CM | POA: Diagnosis not present

## 2019-03-21 DIAGNOSIS — I2609 Other pulmonary embolism with acute cor pulmonale: Secondary | ICD-10-CM | POA: Diagnosis not present

## 2019-03-21 DIAGNOSIS — I82431 Acute embolism and thrombosis of right popliteal vein: Secondary | ICD-10-CM | POA: Diagnosis not present

## 2019-03-21 DIAGNOSIS — I2510 Atherosclerotic heart disease of native coronary artery without angina pectoris: Secondary | ICD-10-CM | POA: Diagnosis not present

## 2019-03-21 DIAGNOSIS — I119 Hypertensive heart disease without heart failure: Secondary | ICD-10-CM | POA: Diagnosis not present

## 2019-03-22 DIAGNOSIS — I82431 Acute embolism and thrombosis of right popliteal vein: Secondary | ICD-10-CM | POA: Diagnosis not present

## 2019-03-22 DIAGNOSIS — I82441 Acute embolism and thrombosis of right tibial vein: Secondary | ICD-10-CM | POA: Diagnosis not present

## 2019-03-22 DIAGNOSIS — I119 Hypertensive heart disease without heart failure: Secondary | ICD-10-CM | POA: Diagnosis not present

## 2019-03-22 DIAGNOSIS — S32019D Unspecified fracture of first lumbar vertebra, subsequent encounter for fracture with routine healing: Secondary | ICD-10-CM | POA: Diagnosis not present

## 2019-03-22 DIAGNOSIS — S2241XD Multiple fractures of ribs, right side, subsequent encounter for fracture with routine healing: Secondary | ICD-10-CM | POA: Diagnosis not present

## 2019-03-22 DIAGNOSIS — I2609 Other pulmonary embolism with acute cor pulmonale: Secondary | ICD-10-CM | POA: Diagnosis not present

## 2019-03-22 DIAGNOSIS — I82411 Acute embolism and thrombosis of right femoral vein: Secondary | ICD-10-CM | POA: Diagnosis not present

## 2019-03-22 DIAGNOSIS — F0390 Unspecified dementia without behavioral disturbance: Secondary | ICD-10-CM | POA: Diagnosis not present

## 2019-03-22 DIAGNOSIS — I2510 Atherosclerotic heart disease of native coronary artery without angina pectoris: Secondary | ICD-10-CM | POA: Diagnosis not present

## 2019-03-24 DIAGNOSIS — D72829 Elevated white blood cell count, unspecified: Secondary | ICD-10-CM | POA: Diagnosis not present

## 2019-03-29 DIAGNOSIS — I2609 Other pulmonary embolism with acute cor pulmonale: Secondary | ICD-10-CM | POA: Diagnosis not present

## 2019-03-29 DIAGNOSIS — I82411 Acute embolism and thrombosis of right femoral vein: Secondary | ICD-10-CM | POA: Diagnosis not present

## 2019-03-29 DIAGNOSIS — I119 Hypertensive heart disease without heart failure: Secondary | ICD-10-CM | POA: Diagnosis not present

## 2019-03-29 DIAGNOSIS — I2510 Atherosclerotic heart disease of native coronary artery without angina pectoris: Secondary | ICD-10-CM | POA: Diagnosis not present

## 2019-03-29 DIAGNOSIS — S2241XD Multiple fractures of ribs, right side, subsequent encounter for fracture with routine healing: Secondary | ICD-10-CM | POA: Diagnosis not present

## 2019-03-29 DIAGNOSIS — F0390 Unspecified dementia without behavioral disturbance: Secondary | ICD-10-CM | POA: Diagnosis not present

## 2019-03-29 DIAGNOSIS — S32019D Unspecified fracture of first lumbar vertebra, subsequent encounter for fracture with routine healing: Secondary | ICD-10-CM | POA: Diagnosis not present

## 2019-03-29 DIAGNOSIS — I82441 Acute embolism and thrombosis of right tibial vein: Secondary | ICD-10-CM | POA: Diagnosis not present

## 2019-03-29 DIAGNOSIS — I82431 Acute embolism and thrombosis of right popliteal vein: Secondary | ICD-10-CM | POA: Diagnosis not present

## 2019-03-31 DIAGNOSIS — I119 Hypertensive heart disease without heart failure: Secondary | ICD-10-CM | POA: Diagnosis not present

## 2019-03-31 DIAGNOSIS — F0390 Unspecified dementia without behavioral disturbance: Secondary | ICD-10-CM | POA: Diagnosis not present

## 2019-03-31 DIAGNOSIS — S2241XD Multiple fractures of ribs, right side, subsequent encounter for fracture with routine healing: Secondary | ICD-10-CM | POA: Diagnosis not present

## 2019-03-31 DIAGNOSIS — I82441 Acute embolism and thrombosis of right tibial vein: Secondary | ICD-10-CM | POA: Diagnosis not present

## 2019-03-31 DIAGNOSIS — S32019D Unspecified fracture of first lumbar vertebra, subsequent encounter for fracture with routine healing: Secondary | ICD-10-CM | POA: Diagnosis not present

## 2019-03-31 DIAGNOSIS — I2609 Other pulmonary embolism with acute cor pulmonale: Secondary | ICD-10-CM | POA: Diagnosis not present

## 2019-03-31 DIAGNOSIS — I2510 Atherosclerotic heart disease of native coronary artery without angina pectoris: Secondary | ICD-10-CM | POA: Diagnosis not present

## 2019-03-31 DIAGNOSIS — I82411 Acute embolism and thrombosis of right femoral vein: Secondary | ICD-10-CM | POA: Diagnosis not present

## 2019-03-31 DIAGNOSIS — I82431 Acute embolism and thrombosis of right popliteal vein: Secondary | ICD-10-CM | POA: Diagnosis not present

## 2019-04-04 DIAGNOSIS — I2609 Other pulmonary embolism with acute cor pulmonale: Secondary | ICD-10-CM | POA: Diagnosis not present

## 2019-04-04 DIAGNOSIS — E782 Mixed hyperlipidemia: Secondary | ICD-10-CM | POA: Diagnosis not present

## 2019-04-04 DIAGNOSIS — F0390 Unspecified dementia without behavioral disturbance: Secondary | ICD-10-CM | POA: Diagnosis not present

## 2019-04-04 DIAGNOSIS — I82441 Acute embolism and thrombosis of right tibial vein: Secondary | ICD-10-CM | POA: Diagnosis not present

## 2019-04-04 DIAGNOSIS — I119 Hypertensive heart disease without heart failure: Secondary | ICD-10-CM | POA: Diagnosis not present

## 2019-04-04 DIAGNOSIS — I82411 Acute embolism and thrombosis of right femoral vein: Secondary | ICD-10-CM | POA: Diagnosis not present

## 2019-04-04 DIAGNOSIS — M8580 Other specified disorders of bone density and structure, unspecified site: Secondary | ICD-10-CM | POA: Diagnosis not present

## 2019-04-04 DIAGNOSIS — I82431 Acute embolism and thrombosis of right popliteal vein: Secondary | ICD-10-CM | POA: Diagnosis not present

## 2019-04-04 DIAGNOSIS — S2241XD Multiple fractures of ribs, right side, subsequent encounter for fracture with routine healing: Secondary | ICD-10-CM | POA: Diagnosis not present

## 2019-04-04 DIAGNOSIS — F339 Major depressive disorder, recurrent, unspecified: Secondary | ICD-10-CM | POA: Diagnosis not present

## 2019-04-04 DIAGNOSIS — I10 Essential (primary) hypertension: Secondary | ICD-10-CM | POA: Diagnosis not present

## 2019-04-04 DIAGNOSIS — I2510 Atherosclerotic heart disease of native coronary artery without angina pectoris: Secondary | ICD-10-CM | POA: Diagnosis not present

## 2019-04-04 DIAGNOSIS — R54 Age-related physical debility: Secondary | ICD-10-CM | POA: Diagnosis not present

## 2019-04-04 DIAGNOSIS — S32019D Unspecified fracture of first lumbar vertebra, subsequent encounter for fracture with routine healing: Secondary | ICD-10-CM | POA: Diagnosis not present

## 2019-04-05 DIAGNOSIS — S2241XD Multiple fractures of ribs, right side, subsequent encounter for fracture with routine healing: Secondary | ICD-10-CM | POA: Diagnosis not present

## 2019-04-05 DIAGNOSIS — S32019D Unspecified fracture of first lumbar vertebra, subsequent encounter for fracture with routine healing: Secondary | ICD-10-CM | POA: Diagnosis not present

## 2019-04-05 DIAGNOSIS — F0390 Unspecified dementia without behavioral disturbance: Secondary | ICD-10-CM | POA: Diagnosis not present

## 2019-04-05 DIAGNOSIS — I119 Hypertensive heart disease without heart failure: Secondary | ICD-10-CM | POA: Diagnosis not present

## 2019-04-05 DIAGNOSIS — I82431 Acute embolism and thrombosis of right popliteal vein: Secondary | ICD-10-CM | POA: Diagnosis not present

## 2019-04-05 DIAGNOSIS — I82411 Acute embolism and thrombosis of right femoral vein: Secondary | ICD-10-CM | POA: Diagnosis not present

## 2019-04-05 DIAGNOSIS — I2510 Atherosclerotic heart disease of native coronary artery without angina pectoris: Secondary | ICD-10-CM | POA: Diagnosis not present

## 2019-04-05 DIAGNOSIS — I82441 Acute embolism and thrombosis of right tibial vein: Secondary | ICD-10-CM | POA: Diagnosis not present

## 2019-04-05 DIAGNOSIS — I2609 Other pulmonary embolism with acute cor pulmonale: Secondary | ICD-10-CM | POA: Diagnosis not present

## 2019-04-07 DIAGNOSIS — I82431 Acute embolism and thrombosis of right popliteal vein: Secondary | ICD-10-CM | POA: Diagnosis not present

## 2019-04-07 DIAGNOSIS — I2510 Atherosclerotic heart disease of native coronary artery without angina pectoris: Secondary | ICD-10-CM | POA: Diagnosis not present

## 2019-04-07 DIAGNOSIS — S32019D Unspecified fracture of first lumbar vertebra, subsequent encounter for fracture with routine healing: Secondary | ICD-10-CM | POA: Diagnosis not present

## 2019-04-07 DIAGNOSIS — I82411 Acute embolism and thrombosis of right femoral vein: Secondary | ICD-10-CM | POA: Diagnosis not present

## 2019-04-07 DIAGNOSIS — I82441 Acute embolism and thrombosis of right tibial vein: Secondary | ICD-10-CM | POA: Diagnosis not present

## 2019-04-07 DIAGNOSIS — I2609 Other pulmonary embolism with acute cor pulmonale: Secondary | ICD-10-CM | POA: Diagnosis not present

## 2019-04-07 DIAGNOSIS — F0390 Unspecified dementia without behavioral disturbance: Secondary | ICD-10-CM | POA: Diagnosis not present

## 2019-04-07 DIAGNOSIS — I2699 Other pulmonary embolism without acute cor pulmonale: Secondary | ICD-10-CM | POA: Diagnosis not present

## 2019-04-07 DIAGNOSIS — S2241XD Multiple fractures of ribs, right side, subsequent encounter for fracture with routine healing: Secondary | ICD-10-CM | POA: Diagnosis not present

## 2019-04-07 DIAGNOSIS — I119 Hypertensive heart disease without heart failure: Secondary | ICD-10-CM | POA: Diagnosis not present

## 2019-04-10 DIAGNOSIS — I700 Atherosclerosis of aorta: Secondary | ICD-10-CM | POA: Diagnosis not present

## 2019-04-10 DIAGNOSIS — I82431 Acute embolism and thrombosis of right popliteal vein: Secondary | ICD-10-CM | POA: Diagnosis not present

## 2019-04-10 DIAGNOSIS — I82411 Acute embolism and thrombosis of right femoral vein: Secondary | ICD-10-CM | POA: Diagnosis not present

## 2019-04-10 DIAGNOSIS — S81811D Laceration without foreign body, right lower leg, subsequent encounter: Secondary | ICD-10-CM | POA: Diagnosis not present

## 2019-04-10 DIAGNOSIS — I119 Hypertensive heart disease without heart failure: Secondary | ICD-10-CM | POA: Diagnosis not present

## 2019-04-10 DIAGNOSIS — I82441 Acute embolism and thrombosis of right tibial vein: Secondary | ICD-10-CM | POA: Diagnosis not present

## 2019-04-10 DIAGNOSIS — I2510 Atherosclerotic heart disease of native coronary artery without angina pectoris: Secondary | ICD-10-CM | POA: Diagnosis not present

## 2019-04-10 DIAGNOSIS — M160 Bilateral primary osteoarthritis of hip: Secondary | ICD-10-CM | POA: Diagnosis not present

## 2019-04-10 DIAGNOSIS — I2609 Other pulmonary embolism with acute cor pulmonale: Secondary | ICD-10-CM | POA: Diagnosis not present

## 2019-04-13 DIAGNOSIS — I82411 Acute embolism and thrombosis of right femoral vein: Secondary | ICD-10-CM | POA: Diagnosis not present

## 2019-04-13 DIAGNOSIS — I2609 Other pulmonary embolism with acute cor pulmonale: Secondary | ICD-10-CM | POA: Diagnosis not present

## 2019-04-13 DIAGNOSIS — S81811D Laceration without foreign body, right lower leg, subsequent encounter: Secondary | ICD-10-CM | POA: Diagnosis not present

## 2019-04-13 DIAGNOSIS — I700 Atherosclerosis of aorta: Secondary | ICD-10-CM | POA: Diagnosis not present

## 2019-04-13 DIAGNOSIS — M160 Bilateral primary osteoarthritis of hip: Secondary | ICD-10-CM | POA: Diagnosis not present

## 2019-04-13 DIAGNOSIS — I82441 Acute embolism and thrombosis of right tibial vein: Secondary | ICD-10-CM | POA: Diagnosis not present

## 2019-04-13 DIAGNOSIS — I119 Hypertensive heart disease without heart failure: Secondary | ICD-10-CM | POA: Diagnosis not present

## 2019-04-13 DIAGNOSIS — I2510 Atherosclerotic heart disease of native coronary artery without angina pectoris: Secondary | ICD-10-CM | POA: Diagnosis not present

## 2019-04-13 DIAGNOSIS — I82431 Acute embolism and thrombosis of right popliteal vein: Secondary | ICD-10-CM | POA: Diagnosis not present

## 2019-04-21 DIAGNOSIS — S81811D Laceration without foreign body, right lower leg, subsequent encounter: Secondary | ICD-10-CM | POA: Diagnosis not present

## 2019-04-21 DIAGNOSIS — M160 Bilateral primary osteoarthritis of hip: Secondary | ICD-10-CM | POA: Diagnosis not present

## 2019-04-21 DIAGNOSIS — I119 Hypertensive heart disease without heart failure: Secondary | ICD-10-CM | POA: Diagnosis not present

## 2019-04-21 DIAGNOSIS — I82411 Acute embolism and thrombosis of right femoral vein: Secondary | ICD-10-CM | POA: Diagnosis not present

## 2019-04-21 DIAGNOSIS — I2510 Atherosclerotic heart disease of native coronary artery without angina pectoris: Secondary | ICD-10-CM | POA: Diagnosis not present

## 2019-04-21 DIAGNOSIS — I700 Atherosclerosis of aorta: Secondary | ICD-10-CM | POA: Diagnosis not present

## 2019-04-21 DIAGNOSIS — I82431 Acute embolism and thrombosis of right popliteal vein: Secondary | ICD-10-CM | POA: Diagnosis not present

## 2019-04-21 DIAGNOSIS — I2609 Other pulmonary embolism with acute cor pulmonale: Secondary | ICD-10-CM | POA: Diagnosis not present

## 2019-04-21 DIAGNOSIS — I82441 Acute embolism and thrombosis of right tibial vein: Secondary | ICD-10-CM | POA: Diagnosis not present

## 2019-04-24 DIAGNOSIS — I82411 Acute embolism and thrombosis of right femoral vein: Secondary | ICD-10-CM | POA: Diagnosis not present

## 2019-04-24 DIAGNOSIS — S81811D Laceration without foreign body, right lower leg, subsequent encounter: Secondary | ICD-10-CM | POA: Diagnosis not present

## 2019-04-24 DIAGNOSIS — I2609 Other pulmonary embolism with acute cor pulmonale: Secondary | ICD-10-CM | POA: Diagnosis not present

## 2019-04-24 DIAGNOSIS — I119 Hypertensive heart disease without heart failure: Secondary | ICD-10-CM | POA: Diagnosis not present

## 2019-04-24 DIAGNOSIS — I700 Atherosclerosis of aorta: Secondary | ICD-10-CM | POA: Diagnosis not present

## 2019-04-24 DIAGNOSIS — I82431 Acute embolism and thrombosis of right popliteal vein: Secondary | ICD-10-CM | POA: Diagnosis not present

## 2019-04-24 DIAGNOSIS — I82441 Acute embolism and thrombosis of right tibial vein: Secondary | ICD-10-CM | POA: Diagnosis not present

## 2019-04-24 DIAGNOSIS — M160 Bilateral primary osteoarthritis of hip: Secondary | ICD-10-CM | POA: Diagnosis not present

## 2019-04-24 DIAGNOSIS — I2510 Atherosclerotic heart disease of native coronary artery without angina pectoris: Secondary | ICD-10-CM | POA: Diagnosis not present

## 2019-04-27 DIAGNOSIS — I82411 Acute embolism and thrombosis of right femoral vein: Secondary | ICD-10-CM | POA: Diagnosis not present

## 2019-04-27 DIAGNOSIS — I2609 Other pulmonary embolism with acute cor pulmonale: Secondary | ICD-10-CM | POA: Diagnosis not present

## 2019-04-27 DIAGNOSIS — I119 Hypertensive heart disease without heart failure: Secondary | ICD-10-CM | POA: Diagnosis not present

## 2019-04-27 DIAGNOSIS — I82431 Acute embolism and thrombosis of right popliteal vein: Secondary | ICD-10-CM | POA: Diagnosis not present

## 2019-04-27 DIAGNOSIS — M160 Bilateral primary osteoarthritis of hip: Secondary | ICD-10-CM | POA: Diagnosis not present

## 2019-04-27 DIAGNOSIS — I2510 Atherosclerotic heart disease of native coronary artery without angina pectoris: Secondary | ICD-10-CM | POA: Diagnosis not present

## 2019-04-27 DIAGNOSIS — I700 Atherosclerosis of aorta: Secondary | ICD-10-CM | POA: Diagnosis not present

## 2019-04-27 DIAGNOSIS — I82441 Acute embolism and thrombosis of right tibial vein: Secondary | ICD-10-CM | POA: Diagnosis not present

## 2019-04-27 DIAGNOSIS — S81811D Laceration without foreign body, right lower leg, subsequent encounter: Secondary | ICD-10-CM | POA: Diagnosis not present

## 2019-05-01 ENCOUNTER — Telehealth: Payer: Self-pay | Admitting: Hematology

## 2019-05-01 DIAGNOSIS — I82441 Acute embolism and thrombosis of right tibial vein: Secondary | ICD-10-CM | POA: Diagnosis not present

## 2019-05-01 DIAGNOSIS — I700 Atherosclerosis of aorta: Secondary | ICD-10-CM | POA: Diagnosis not present

## 2019-05-01 DIAGNOSIS — S81811D Laceration without foreign body, right lower leg, subsequent encounter: Secondary | ICD-10-CM | POA: Diagnosis not present

## 2019-05-01 DIAGNOSIS — E782 Mixed hyperlipidemia: Secondary | ICD-10-CM | POA: Diagnosis not present

## 2019-05-01 DIAGNOSIS — I2510 Atherosclerotic heart disease of native coronary artery without angina pectoris: Secondary | ICD-10-CM | POA: Diagnosis not present

## 2019-05-01 DIAGNOSIS — M8580 Other specified disorders of bone density and structure, unspecified site: Secondary | ICD-10-CM | POA: Diagnosis not present

## 2019-05-01 DIAGNOSIS — I82411 Acute embolism and thrombosis of right femoral vein: Secondary | ICD-10-CM | POA: Diagnosis not present

## 2019-05-01 DIAGNOSIS — F339 Major depressive disorder, recurrent, unspecified: Secondary | ICD-10-CM | POA: Diagnosis not present

## 2019-05-01 DIAGNOSIS — F0390 Unspecified dementia without behavioral disturbance: Secondary | ICD-10-CM | POA: Diagnosis not present

## 2019-05-01 DIAGNOSIS — I10 Essential (primary) hypertension: Secondary | ICD-10-CM | POA: Diagnosis not present

## 2019-05-01 DIAGNOSIS — M160 Bilateral primary osteoarthritis of hip: Secondary | ICD-10-CM | POA: Diagnosis not present

## 2019-05-01 DIAGNOSIS — I82431 Acute embolism and thrombosis of right popliteal vein: Secondary | ICD-10-CM | POA: Diagnosis not present

## 2019-05-01 DIAGNOSIS — R54 Age-related physical debility: Secondary | ICD-10-CM | POA: Diagnosis not present

## 2019-05-01 DIAGNOSIS — I119 Hypertensive heart disease without heart failure: Secondary | ICD-10-CM | POA: Diagnosis not present

## 2019-05-01 DIAGNOSIS — I2609 Other pulmonary embolism with acute cor pulmonale: Secondary | ICD-10-CM | POA: Diagnosis not present

## 2019-05-01 NOTE — Telephone Encounter (Signed)
I cld and spoke to pt's dtr to reschedule appt w/Dr. Irene Limbo on 4/29 at 11am.

## 2019-05-04 DIAGNOSIS — I2510 Atherosclerotic heart disease of native coronary artery without angina pectoris: Secondary | ICD-10-CM | POA: Diagnosis not present

## 2019-05-04 DIAGNOSIS — I119 Hypertensive heart disease without heart failure: Secondary | ICD-10-CM | POA: Diagnosis not present

## 2019-05-04 DIAGNOSIS — S81811D Laceration without foreign body, right lower leg, subsequent encounter: Secondary | ICD-10-CM | POA: Diagnosis not present

## 2019-05-04 DIAGNOSIS — M160 Bilateral primary osteoarthritis of hip: Secondary | ICD-10-CM | POA: Diagnosis not present

## 2019-05-04 DIAGNOSIS — I82441 Acute embolism and thrombosis of right tibial vein: Secondary | ICD-10-CM | POA: Diagnosis not present

## 2019-05-04 DIAGNOSIS — I2609 Other pulmonary embolism with acute cor pulmonale: Secondary | ICD-10-CM | POA: Diagnosis not present

## 2019-05-04 DIAGNOSIS — I82431 Acute embolism and thrombosis of right popliteal vein: Secondary | ICD-10-CM | POA: Diagnosis not present

## 2019-05-04 DIAGNOSIS — I82411 Acute embolism and thrombosis of right femoral vein: Secondary | ICD-10-CM | POA: Diagnosis not present

## 2019-05-04 DIAGNOSIS — I700 Atherosclerosis of aorta: Secondary | ICD-10-CM | POA: Diagnosis not present

## 2019-05-08 DIAGNOSIS — I2699 Other pulmonary embolism without acute cor pulmonale: Secondary | ICD-10-CM | POA: Diagnosis not present

## 2019-05-09 DIAGNOSIS — M160 Bilateral primary osteoarthritis of hip: Secondary | ICD-10-CM | POA: Diagnosis not present

## 2019-05-09 DIAGNOSIS — I82411 Acute embolism and thrombosis of right femoral vein: Secondary | ICD-10-CM | POA: Diagnosis not present

## 2019-05-09 DIAGNOSIS — I82431 Acute embolism and thrombosis of right popliteal vein: Secondary | ICD-10-CM | POA: Diagnosis not present

## 2019-05-09 DIAGNOSIS — S81811D Laceration without foreign body, right lower leg, subsequent encounter: Secondary | ICD-10-CM | POA: Diagnosis not present

## 2019-05-09 DIAGNOSIS — I2609 Other pulmonary embolism with acute cor pulmonale: Secondary | ICD-10-CM | POA: Diagnosis not present

## 2019-05-09 DIAGNOSIS — I119 Hypertensive heart disease without heart failure: Secondary | ICD-10-CM | POA: Diagnosis not present

## 2019-05-09 DIAGNOSIS — I82441 Acute embolism and thrombosis of right tibial vein: Secondary | ICD-10-CM | POA: Diagnosis not present

## 2019-05-09 DIAGNOSIS — I700 Atherosclerosis of aorta: Secondary | ICD-10-CM | POA: Diagnosis not present

## 2019-05-09 DIAGNOSIS — I2510 Atherosclerotic heart disease of native coronary artery without angina pectoris: Secondary | ICD-10-CM | POA: Diagnosis not present

## 2019-05-10 DIAGNOSIS — I119 Hypertensive heart disease without heart failure: Secondary | ICD-10-CM | POA: Diagnosis not present

## 2019-05-10 DIAGNOSIS — I700 Atherosclerosis of aorta: Secondary | ICD-10-CM | POA: Diagnosis not present

## 2019-05-10 DIAGNOSIS — M160 Bilateral primary osteoarthritis of hip: Secondary | ICD-10-CM | POA: Diagnosis not present

## 2019-05-10 DIAGNOSIS — I2510 Atherosclerotic heart disease of native coronary artery without angina pectoris: Secondary | ICD-10-CM | POA: Diagnosis not present

## 2019-05-10 DIAGNOSIS — I82431 Acute embolism and thrombosis of right popliteal vein: Secondary | ICD-10-CM | POA: Diagnosis not present

## 2019-05-10 DIAGNOSIS — I82411 Acute embolism and thrombosis of right femoral vein: Secondary | ICD-10-CM | POA: Diagnosis not present

## 2019-05-10 DIAGNOSIS — I82441 Acute embolism and thrombosis of right tibial vein: Secondary | ICD-10-CM | POA: Diagnosis not present

## 2019-05-10 DIAGNOSIS — S81811D Laceration without foreign body, right lower leg, subsequent encounter: Secondary | ICD-10-CM | POA: Diagnosis not present

## 2019-05-10 DIAGNOSIS — I2609 Other pulmonary embolism with acute cor pulmonale: Secondary | ICD-10-CM | POA: Diagnosis not present

## 2019-05-16 DIAGNOSIS — I2510 Atherosclerotic heart disease of native coronary artery without angina pectoris: Secondary | ICD-10-CM | POA: Diagnosis not present

## 2019-05-16 DIAGNOSIS — I119 Hypertensive heart disease without heart failure: Secondary | ICD-10-CM | POA: Diagnosis not present

## 2019-05-16 DIAGNOSIS — I82431 Acute embolism and thrombosis of right popliteal vein: Secondary | ICD-10-CM | POA: Diagnosis not present

## 2019-05-16 DIAGNOSIS — M160 Bilateral primary osteoarthritis of hip: Secondary | ICD-10-CM | POA: Diagnosis not present

## 2019-05-16 DIAGNOSIS — I82411 Acute embolism and thrombosis of right femoral vein: Secondary | ICD-10-CM | POA: Diagnosis not present

## 2019-05-16 DIAGNOSIS — S81811D Laceration without foreign body, right lower leg, subsequent encounter: Secondary | ICD-10-CM | POA: Diagnosis not present

## 2019-05-16 DIAGNOSIS — I700 Atherosclerosis of aorta: Secondary | ICD-10-CM | POA: Diagnosis not present

## 2019-05-16 DIAGNOSIS — I82441 Acute embolism and thrombosis of right tibial vein: Secondary | ICD-10-CM | POA: Diagnosis not present

## 2019-05-16 DIAGNOSIS — I2609 Other pulmonary embolism with acute cor pulmonale: Secondary | ICD-10-CM | POA: Diagnosis not present

## 2019-05-17 NOTE — Progress Notes (Signed)
HEMATOLOGY/ONCOLOGY CONSULTATION NOTE  Date of Service: 05/18/2019  Patient Care Team: Leighton Ruff, MD as PCP - General (Family Medicine)  REFERRING PHYSICIAN: Leighton Ruff, MD  CHIEF COMPLAINTS/PURPOSE OF CONSULTATION:  Length of Anticoagulaiotn for PE  HISTORY OF PRESENTING ILLNESS:   Julie Martinez is a wonderful 86 y.o. female who has been referred to [place] by Leighton Ruff, MD for evaluation and management of length of anticoagulation for PE. Pt is accompanied today by daughter. The pt reports that she is doing well overall.   The pt reports she is good. She has been in and out of hospital with infection issues and has had falls. Pt is eating all her meals now, but previously was not eating or drinking much. Her daughters son is taking care of her. The falls have been an issue and has not been moving around much. Pt has never had clots previously. Since the fall the pt had been moving less. Pt had clots in right leg and both lungs. She was SOB previously to finding clots. Pt was on oxygen up until the last month. Has not had SOB while walking recently. Pt is not having leg pain or back pain. She has not had any falls since being on Eliquis and has not had any bleeding issues of nose bleeds, gum bleeds, blood in stool. Pt had sore on leg while in hospital but it is now healed. She was on a baby aspirin before the blood clots occurred. Pt gets around with a walker.   Of note prior to the patient's visit today, pt has had US Venous Lower Extremity Bilateral completed on 02/03/19  with results revealing "Positive for DVT involving the entire right femoral vein as well as the right popliteal and right posterior tibial veins." Pt has had CT Angio Pulmonary completed on 02/03/19 with results revealing "Bilateral pulmonary emboli. Large thrombus burden. Bibasilar atelectasis and scarring similar to the prior study." Pt has had XR Chest Ap Portable completed on 03/10/19 with  results revealing "Mild bilateral basilar densities are present, which may represent developing infiltrates or atelectasis."  Most recent lab results (08/30/2018) of CBC is as follows: all values are WNL except for Chloride at 108 01/17/2019 of Vitamin B12 at 303: WNL 08/30/2018 of TSH at 1.59: WNL 08/30/2018 of Vitamin D 25(OH) Total at 30.35: WNL 08/30/2018 of Lipid Panel w/Reflex is as follows: all values are WNL except for: LDL at 120, NHDL at 137   On review of systems, pt  denies significant weight loss, SOB, leg pain, back pain, abdominal pain, calf pain, thigh pain, additional, nose bleeds, gum bleeds, blood in stool falls and any other symptoms.   On PMHx the pt reports no hx of blood clots   On Family Hx the pt reports no hx of clotting disorder   MEDICAL HISTORY:  Past Medical History:  Diagnosis Date  . High cholesterol   . Hypertension   . Osteopenia      SURGICAL HISTORY: Past Surgical History:  Procedure Laterality Date  . ABDOMINAL HYSTERECTOMY    . EYE SURGERY    . SHOULDER SURGERY  2017   plating for fracture     SOCIAL HISTORY: Social History   Socioeconomic History  . Marital status: Widowed    Spouse name: Not on file  . Number of children: 3  . Years of education: 32  . Highest education level: High school graduate  Occupational History  . Occupation: n/a  Tobacco Use  .  Smoking status: Never Smoker  . Smokeless tobacco: Never Used  Substance and Sexual Activity  . Alcohol use: No  . Drug use: No  . Sexual activity: Not on file  Other Topics Concern  . Not on file  Social History Narrative   Patient lives at home alone. There is always a family member there 24/7.   Caffeine Use: 1 cup of coffee daily   Right handed   Social Determinants of Health   Financial Resource Strain:   . Difficulty of Paying Living Expenses:   Food Insecurity:   . Worried About Charity fundraiser in the Last Year:   . Arboriculturist in the Last Year:    Transportation Needs:   . Film/video editor (Medical):   Marland Kitchen Lack of Transportation (Non-Medical):   Physical Activity:   . Days of Exercise per Week:   . Minutes of Exercise per Session:   Stress:   . Feeling of Stress :   Social Connections:   . Frequency of Communication with Friends and Family:   . Frequency of Social Gatherings with Friends and Family:   . Attends Religious Services:   . Active Member of Clubs or Organizations:   . Attends Archivist Meetings:   Marland Kitchen Marital Status:   Intimate Partner Violence:   . Fear of Current or Ex-Partner:   . Emotionally Abused:   Marland Kitchen Physically Abused:   . Sexually Abused:      FAMILY HISTORY: Family History  Problem Relation Age of Onset  . Heart failure Mother      ALLERGIES:   has No Known Allergies.   MEDICATIONS:  Current Outpatient Medications  Medication Sig Dispense Refill  . Acetaminophen (TYLENOL ARTHRITIS PAIN PO) Take by mouth.    Marland Kitchen alendronate (FOSAMAX) 70 MG tablet Take 70 mg by mouth once a week. Take with a full glass of water on an empty stomach.    Marland Kitchen aspirin 81 MG EC tablet Take 81 mg by mouth daily.    Marland Kitchen donepezil (ARICEPT) 10 MG tablet TAKE 1 TABLET BY MOUTH AT BEDTIME. 30 tablet 12  . ELIQUIS 5 MG TABS tablet Take 5 mg by mouth 2 (two) times daily.     . memantine (NAMENDA) 10 MG tablet Take 1 tablet (10 mg total) by mouth 2 (two) times daily. 60 tablet 12  . quinapril (ACCUPRIL) 20 MG tablet Take 20 mg by mouth at bedtime.    . triamterene-hydrochlorothiazide (DYAZIDE) 37.5-25 MG capsule Take 1 capsule by mouth daily.  0   No current facility-administered medications for this visit.   REVIEW OF SYSTEMS:   A 10+ POINT REVIEW OF SYSTEMS WAS OBTAINED including neurology, dermatology, psychiatry, cardiac, respiratory, lymph, extremities, GI, GU, Musculoskeletal, constitutional, breasts, reproductive, HEENT.  All pertinent positives are noted in the HPI.  All others are negative.   PHYSICAL  EXAMINATION: ECOG PERFORMANCE STATUS: 2 - Symptomatic, <50% confined to bed  Vitals:   05/18/19 1053  BP: (!) 142/58  Pulse: 65  Resp: 17  Temp: 99.1 F (37.3 C)  SpO2: 97%   Filed Weights   05/18/19 1053  Weight: 114 lb 3.2 oz (51.8 kg)   Body mass index is 20.89 kg/m.   GENERAL:alert, in no acute distress and comfortable SKIN: no acute rashes, no significant lesions EYES: conjunctiva are pink and non-injected, sclera anicteric OROPHARYNX: MMM, no exudates, no oropharyngeal erythema or ulceration NECK: supple, no JVD LYMPH:  no palpable lymphadenopathy in the cervical,  axillary or inguinal regions LUNGS: clear to auscultation b/l with normal respiratory effort HEART: regular rate & rhythm ABDOMEN:  normoactive bowel sounds , non tender, not distended. Extremity: no pedal edema PSYCH: alert & oriented x 3 with fluent speech NEURO: no focal motor/sensory deficits  LABORATORY DATA:  I have reviewed the data as listed  CBC Latest Ref Rng & Units 05/18/2019 02/01/2018 01/30/2018  WBC 4.0 - 10.5 K/uL 6.6 10.8(H) 11.3(H)  Hemoglobin 12.0 - 15.0 g/dL 11.7(L) 13.0 13.5  Hematocrit 36.0 - 46.0 % 37.3 40.1 43.1  Platelets 150 - 400 K/uL 238 247 216    CMP Latest Ref Rng & Units 05/18/2019 01/30/2018 03/08/2017  Glucose 70 - 99 mg/dL 85 108(H) 96  BUN 8 - 23 mg/dL 22 18 21(H)  Creatinine 0.44 - 1.00 mg/dL 0.92 1.04(H) 1.01(H)  Sodium 135 - 145 mmol/L 143 136 142  Potassium 3.5 - 5.1 mmol/L 3.8 3.6 4.3  Chloride 98 - 111 mmol/L 110 100 106  CO2 22 - 32 mmol/L 26 27 28   Calcium 8.9 - 10.3 mg/dL 8.7(L) 8.8(L) 9.6  Total Protein 6.5 - 8.1 g/dL 6.1(L) 7.2 7.1  Total Bilirubin 0.3 - 1.2 mg/dL 0.3 0.8 0.5  Alkaline Phos 38 - 126 U/L 66 61 62  AST 15 - 41 U/L 14(L) 15 22  ALT 0 - 44 U/L 10 10 13(L)      RADIOGRAPHIC STUDIES: I have personally reviewed the radiological images as listed and agreed with the findings in the report. No results found.   US Venous Lower Extremity  Bilateral completed on 02/03/19  with results revealing "Positive for DVT involving the entire right femoral vein as well as the right popliteal and right posterior tibial veins."  CT Angio Pulmonary completed on 02/03/19 with results revealing "Bilateral pulmonary emboli. Large thrombus burden. Bibasilar atelectasis and scarring similar to the prior study."  XR Chest Ap Portable completed on 03/10/19 with results revealing "Mild bilateral basilar densities are present, which may represent developing infiltrates or atelectasis."  ASSESSMENT & PLAN:   KARRIN STACY is a 86 y.o. female with:  1. Extensive RLE DVT 2. 2. Extensive b/l pulmonary embolism 3. No clear single provoking event. 4. Several possible risk factors -- age, relative immobility etc   PLAN: -Discussed 02/03/19 of US Venous Lower Extremity Bilateral -Discussed 02/03/19 of CT Angio Pulmonary -Educated on considerations for keeping on Eliquis -Educated risk factors like falling, age, extensive clots, immobility  -Educated risk of repeated blood clot -Educated dissolving of blood clots  -Unlikely inherited clotting disorder.  -Advised full dose Eliquis for 6 months then switching to preventive dose of Eliquis -if risk of falling, bleeding issues, or kidney issues then stopping Eliquis  -Advised preventive dose is usually aspirin but that was ineffective with previous blood clot  -Advised on ability to maintain hydration and food intake  -Recommends stopping aspirin unless good reason PCP has for keeping it- f/u with PCP -Recommends continuing therapeutic Eliquis for 2 more months then moving to preventive dose   -Will get labs today  -Will see back as needed   FOLLOW UP RTC with PCP  The total time spent in the appt was 45 minutes and more than 50% was on counseling and direct patient cares.  All of the patient's questions were answered with apparent satisfaction. The patient knows to call the clinic with any  problems, questions or concerns.  Sullivan Lone MD [place] AAHIVMS [facility] Hematology/Oncology Physician [place]  (Office):  [phone] (Work cell):  [phone] (Fax):           [phone]  05/18/2019 1:00 PM  I, Dawayne Cirri am acting as a Education administrator for Dr. Sullivan Lone.   .I have reviewed the above documentation for accuracy and completeness, and I agree with the above. Brunetta Genera MD

## 2019-05-18 ENCOUNTER — Inpatient Hospital Stay: Payer: Medicare HMO | Attending: Hematology | Admitting: Hematology

## 2019-05-18 ENCOUNTER — Inpatient Hospital Stay: Payer: Medicare HMO

## 2019-05-18 ENCOUNTER — Other Ambulatory Visit: Payer: Self-pay

## 2019-05-18 VITALS — BP 142/58 | HR 65 | Temp 99.1°F | Resp 17 | Ht 62.0 in | Wt 114.2 lb

## 2019-05-18 DIAGNOSIS — Z86718 Personal history of other venous thrombosis and embolism: Secondary | ICD-10-CM | POA: Insufficient documentation

## 2019-05-18 DIAGNOSIS — Z7982 Long term (current) use of aspirin: Secondary | ICD-10-CM | POA: Diagnosis not present

## 2019-05-18 DIAGNOSIS — M8580 Other specified disorders of bone density and structure, unspecified site: Secondary | ICD-10-CM | POA: Insufficient documentation

## 2019-05-18 DIAGNOSIS — I82411 Acute embolism and thrombosis of right femoral vein: Secondary | ICD-10-CM

## 2019-05-18 DIAGNOSIS — I10 Essential (primary) hypertension: Secondary | ICD-10-CM | POA: Diagnosis not present

## 2019-05-18 DIAGNOSIS — Z79899 Other long term (current) drug therapy: Secondary | ICD-10-CM | POA: Diagnosis not present

## 2019-05-18 DIAGNOSIS — Z7901 Long term (current) use of anticoagulants: Secondary | ICD-10-CM | POA: Diagnosis not present

## 2019-05-18 DIAGNOSIS — E7800 Pure hypercholesterolemia, unspecified: Secondary | ICD-10-CM | POA: Insufficient documentation

## 2019-05-18 DIAGNOSIS — I2699 Other pulmonary embolism without acute cor pulmonale: Secondary | ICD-10-CM | POA: Diagnosis not present

## 2019-05-18 LAB — CMP (CANCER CENTER ONLY)
ALT: 10 U/L (ref 0–44)
AST: 14 U/L — ABNORMAL LOW (ref 15–41)
Albumin: 3.1 g/dL — ABNORMAL LOW (ref 3.5–5.0)
Alkaline Phosphatase: 66 U/L (ref 38–126)
Anion gap: 7 (ref 5–15)
BUN: 22 mg/dL (ref 8–23)
CO2: 26 mmol/L (ref 22–32)
Calcium: 8.7 mg/dL — ABNORMAL LOW (ref 8.9–10.3)
Chloride: 110 mmol/L (ref 98–111)
Creatinine: 0.92 mg/dL (ref 0.44–1.00)
GFR, Est AFR Am: >60 mL/min (ref >60)
GFR, Estimated: 56 mL/min — ABNORMAL LOW (ref >60)
Glucose, Bld: 85 mg/dL (ref 70–99)
Potassium: 3.8 mmol/L (ref 3.5–5.1)
Sodium: 143 mmol/L (ref 135–145)
Total Bilirubin: 0.3 mg/dL (ref 0.3–1.2)
Total Protein: 6.1 g/dL — ABNORMAL LOW (ref 6.5–8.1)

## 2019-05-18 LAB — CBC WITH DIFFERENTIAL/PLATELET
Abs Immature Granulocytes: 0.02 10*3/uL (ref 0.00–0.07)
Basophils Absolute: 0.1 10*3/uL (ref 0.0–0.1)
Basophils Relative: 1 %
Eosinophils Absolute: 0.7 10*3/uL — ABNORMAL HIGH (ref 0.0–0.5)
Eosinophils Relative: 11 %
HCT: 37.3 % (ref 36.0–46.0)
Hemoglobin: 11.7 g/dL — ABNORMAL LOW (ref 12.0–15.0)
Immature Granulocytes: 0 %
Lymphocytes Relative: 19 %
Lymphs Abs: 1.3 10*3/uL (ref 0.7–4.0)
MCH: 31.3 pg (ref 26.0–34.0)
MCHC: 31.4 g/dL (ref 30.0–36.0)
MCV: 99.7 fL (ref 80.0–100.0)
Monocytes Absolute: 0.7 10*3/uL (ref 0.1–1.0)
Monocytes Relative: 10 %
Neutro Abs: 3.9 10*3/uL (ref 1.7–7.7)
Neutrophils Relative %: 59 %
Platelets: 238 10*3/uL (ref 150–400)
RBC: 3.74 MIL/uL — ABNORMAL LOW (ref 3.87–5.11)
RDW: 14.9 % (ref 11.5–15.5)
WBC: 6.6 10*3/uL (ref 4.0–10.5)
nRBC: 0 % (ref 0.0–0.2)

## 2019-05-22 DIAGNOSIS — S81811D Laceration without foreign body, right lower leg, subsequent encounter: Secondary | ICD-10-CM | POA: Diagnosis not present

## 2019-05-22 DIAGNOSIS — I82431 Acute embolism and thrombosis of right popliteal vein: Secondary | ICD-10-CM | POA: Diagnosis not present

## 2019-05-22 DIAGNOSIS — I700 Atherosclerosis of aorta: Secondary | ICD-10-CM | POA: Diagnosis not present

## 2019-05-22 DIAGNOSIS — I119 Hypertensive heart disease without heart failure: Secondary | ICD-10-CM | POA: Diagnosis not present

## 2019-05-22 DIAGNOSIS — I2609 Other pulmonary embolism with acute cor pulmonale: Secondary | ICD-10-CM | POA: Diagnosis not present

## 2019-05-22 DIAGNOSIS — I82441 Acute embolism and thrombosis of right tibial vein: Secondary | ICD-10-CM | POA: Diagnosis not present

## 2019-05-22 DIAGNOSIS — I2510 Atherosclerotic heart disease of native coronary artery without angina pectoris: Secondary | ICD-10-CM | POA: Diagnosis not present

## 2019-05-22 DIAGNOSIS — I82411 Acute embolism and thrombosis of right femoral vein: Secondary | ICD-10-CM | POA: Diagnosis not present

## 2019-05-22 DIAGNOSIS — M160 Bilateral primary osteoarthritis of hip: Secondary | ICD-10-CM | POA: Diagnosis not present

## 2019-05-30 DIAGNOSIS — R54 Age-related physical debility: Secondary | ICD-10-CM | POA: Diagnosis not present

## 2019-05-30 DIAGNOSIS — E782 Mixed hyperlipidemia: Secondary | ICD-10-CM | POA: Diagnosis not present

## 2019-05-30 DIAGNOSIS — F0390 Unspecified dementia without behavioral disturbance: Secondary | ICD-10-CM | POA: Diagnosis not present

## 2019-05-30 DIAGNOSIS — F339 Major depressive disorder, recurrent, unspecified: Secondary | ICD-10-CM | POA: Diagnosis not present

## 2019-05-30 DIAGNOSIS — I10 Essential (primary) hypertension: Secondary | ICD-10-CM | POA: Diagnosis not present

## 2019-05-30 DIAGNOSIS — M8580 Other specified disorders of bone density and structure, unspecified site: Secondary | ICD-10-CM | POA: Diagnosis not present

## 2019-05-31 DIAGNOSIS — I700 Atherosclerosis of aorta: Secondary | ICD-10-CM | POA: Diagnosis not present

## 2019-05-31 DIAGNOSIS — S81811D Laceration without foreign body, right lower leg, subsequent encounter: Secondary | ICD-10-CM | POA: Diagnosis not present

## 2019-05-31 DIAGNOSIS — I2609 Other pulmonary embolism with acute cor pulmonale: Secondary | ICD-10-CM | POA: Diagnosis not present

## 2019-05-31 DIAGNOSIS — I82441 Acute embolism and thrombosis of right tibial vein: Secondary | ICD-10-CM | POA: Diagnosis not present

## 2019-05-31 DIAGNOSIS — I82431 Acute embolism and thrombosis of right popliteal vein: Secondary | ICD-10-CM | POA: Diagnosis not present

## 2019-05-31 DIAGNOSIS — I2510 Atherosclerotic heart disease of native coronary artery without angina pectoris: Secondary | ICD-10-CM | POA: Diagnosis not present

## 2019-05-31 DIAGNOSIS — I119 Hypertensive heart disease without heart failure: Secondary | ICD-10-CM | POA: Diagnosis not present

## 2019-05-31 DIAGNOSIS — M160 Bilateral primary osteoarthritis of hip: Secondary | ICD-10-CM | POA: Diagnosis not present

## 2019-05-31 DIAGNOSIS — I82411 Acute embolism and thrombosis of right femoral vein: Secondary | ICD-10-CM | POA: Diagnosis not present

## 2019-06-06 DIAGNOSIS — I700 Atherosclerosis of aorta: Secondary | ICD-10-CM | POA: Diagnosis not present

## 2019-06-06 DIAGNOSIS — I82411 Acute embolism and thrombosis of right femoral vein: Secondary | ICD-10-CM | POA: Diagnosis not present

## 2019-06-06 DIAGNOSIS — I2609 Other pulmonary embolism with acute cor pulmonale: Secondary | ICD-10-CM | POA: Diagnosis not present

## 2019-06-06 DIAGNOSIS — S81811D Laceration without foreign body, right lower leg, subsequent encounter: Secondary | ICD-10-CM | POA: Diagnosis not present

## 2019-06-06 DIAGNOSIS — I119 Hypertensive heart disease without heart failure: Secondary | ICD-10-CM | POA: Diagnosis not present

## 2019-06-06 DIAGNOSIS — M160 Bilateral primary osteoarthritis of hip: Secondary | ICD-10-CM | POA: Diagnosis not present

## 2019-06-06 DIAGNOSIS — I82431 Acute embolism and thrombosis of right popliteal vein: Secondary | ICD-10-CM | POA: Diagnosis not present

## 2019-06-06 DIAGNOSIS — I82441 Acute embolism and thrombosis of right tibial vein: Secondary | ICD-10-CM | POA: Diagnosis not present

## 2019-06-06 DIAGNOSIS — I2510 Atherosclerotic heart disease of native coronary artery without angina pectoris: Secondary | ICD-10-CM | POA: Diagnosis not present

## 2019-06-07 DIAGNOSIS — I2699 Other pulmonary embolism without acute cor pulmonale: Secondary | ICD-10-CM | POA: Diagnosis not present

## 2019-06-26 DIAGNOSIS — F0390 Unspecified dementia without behavioral disturbance: Secondary | ICD-10-CM | POA: Diagnosis not present

## 2019-06-26 DIAGNOSIS — M8580 Other specified disorders of bone density and structure, unspecified site: Secondary | ICD-10-CM | POA: Diagnosis not present

## 2019-06-26 DIAGNOSIS — I10 Essential (primary) hypertension: Secondary | ICD-10-CM | POA: Diagnosis not present

## 2019-06-26 DIAGNOSIS — E782 Mixed hyperlipidemia: Secondary | ICD-10-CM | POA: Diagnosis not present

## 2019-06-26 DIAGNOSIS — R54 Age-related physical debility: Secondary | ICD-10-CM | POA: Diagnosis not present

## 2019-06-26 DIAGNOSIS — F339 Major depressive disorder, recurrent, unspecified: Secondary | ICD-10-CM | POA: Diagnosis not present

## 2019-07-08 DIAGNOSIS — I2699 Other pulmonary embolism without acute cor pulmonale: Secondary | ICD-10-CM | POA: Diagnosis not present

## 2019-08-07 DIAGNOSIS — I2699 Other pulmonary embolism without acute cor pulmonale: Secondary | ICD-10-CM | POA: Diagnosis not present

## 2019-08-10 DIAGNOSIS — I10 Essential (primary) hypertension: Secondary | ICD-10-CM | POA: Diagnosis not present

## 2019-08-10 DIAGNOSIS — F0390 Unspecified dementia without behavioral disturbance: Secondary | ICD-10-CM | POA: Diagnosis not present

## 2019-08-10 DIAGNOSIS — F339 Major depressive disorder, recurrent, unspecified: Secondary | ICD-10-CM | POA: Diagnosis not present

## 2019-08-10 DIAGNOSIS — E782 Mixed hyperlipidemia: Secondary | ICD-10-CM | POA: Diagnosis not present

## 2019-08-10 DIAGNOSIS — R54 Age-related physical debility: Secondary | ICD-10-CM | POA: Diagnosis not present

## 2019-08-10 DIAGNOSIS — M8580 Other specified disorders of bone density and structure, unspecified site: Secondary | ICD-10-CM | POA: Diagnosis not present

## 2019-08-22 DIAGNOSIS — L03116 Cellulitis of left lower limb: Secondary | ICD-10-CM | POA: Diagnosis not present

## 2019-08-22 DIAGNOSIS — L309 Dermatitis, unspecified: Secondary | ICD-10-CM | POA: Diagnosis not present

## 2019-08-25 DIAGNOSIS — L03119 Cellulitis of unspecified part of limb: Secondary | ICD-10-CM | POA: Diagnosis not present

## 2019-08-25 DIAGNOSIS — L309 Dermatitis, unspecified: Secondary | ICD-10-CM | POA: Diagnosis not present

## 2019-09-07 DIAGNOSIS — R54 Age-related physical debility: Secondary | ICD-10-CM | POA: Diagnosis not present

## 2019-09-07 DIAGNOSIS — E782 Mixed hyperlipidemia: Secondary | ICD-10-CM | POA: Diagnosis not present

## 2019-09-07 DIAGNOSIS — F339 Major depressive disorder, recurrent, unspecified: Secondary | ICD-10-CM | POA: Diagnosis not present

## 2019-09-07 DIAGNOSIS — M8580 Other specified disorders of bone density and structure, unspecified site: Secondary | ICD-10-CM | POA: Diagnosis not present

## 2019-09-07 DIAGNOSIS — I10 Essential (primary) hypertension: Secondary | ICD-10-CM | POA: Diagnosis not present

## 2019-09-07 DIAGNOSIS — F0390 Unspecified dementia without behavioral disturbance: Secondary | ICD-10-CM | POA: Diagnosis not present

## 2019-10-04 DIAGNOSIS — R54 Age-related physical debility: Secondary | ICD-10-CM | POA: Diagnosis not present

## 2019-10-04 DIAGNOSIS — E782 Mixed hyperlipidemia: Secondary | ICD-10-CM | POA: Diagnosis not present

## 2019-10-04 DIAGNOSIS — F0390 Unspecified dementia without behavioral disturbance: Secondary | ICD-10-CM | POA: Diagnosis not present

## 2019-10-04 DIAGNOSIS — F339 Major depressive disorder, recurrent, unspecified: Secondary | ICD-10-CM | POA: Diagnosis not present

## 2019-10-04 DIAGNOSIS — I10 Essential (primary) hypertension: Secondary | ICD-10-CM | POA: Diagnosis not present

## 2019-10-04 DIAGNOSIS — M8580 Other specified disorders of bone density and structure, unspecified site: Secondary | ICD-10-CM | POA: Diagnosis not present

## 2019-11-07 DIAGNOSIS — R54 Age-related physical debility: Secondary | ICD-10-CM | POA: Diagnosis not present

## 2019-11-07 DIAGNOSIS — I10 Essential (primary) hypertension: Secondary | ICD-10-CM | POA: Diagnosis not present

## 2019-11-07 DIAGNOSIS — E782 Mixed hyperlipidemia: Secondary | ICD-10-CM | POA: Diagnosis not present

## 2019-11-07 DIAGNOSIS — M8580 Other specified disorders of bone density and structure, unspecified site: Secondary | ICD-10-CM | POA: Diagnosis not present

## 2019-11-07 DIAGNOSIS — F339 Major depressive disorder, recurrent, unspecified: Secondary | ICD-10-CM | POA: Diagnosis not present

## 2019-11-07 DIAGNOSIS — F0390 Unspecified dementia without behavioral disturbance: Secondary | ICD-10-CM | POA: Diagnosis not present

## 2019-11-09 ENCOUNTER — Ambulatory Visit: Payer: Medicare HMO | Admitting: Family Medicine

## 2019-11-09 ENCOUNTER — Encounter: Payer: Self-pay | Admitting: Family Medicine

## 2019-11-09 VITALS — BP 144/82 | HR 99 | Ht 59.0 in | Wt 115.0 lb

## 2019-11-09 DIAGNOSIS — G4700 Insomnia, unspecified: Secondary | ICD-10-CM | POA: Diagnosis not present

## 2019-11-09 DIAGNOSIS — F0390 Unspecified dementia without behavioral disturbance: Secondary | ICD-10-CM

## 2019-11-09 DIAGNOSIS — R451 Restlessness and agitation: Secondary | ICD-10-CM | POA: Diagnosis not present

## 2019-11-09 MED ORDER — QUETIAPINE FUMARATE 25 MG PO TABS: 12.5000 mg | ORAL_TABLET | Freq: Every day | ORAL | 3 refills | Status: DC

## 2019-11-09 MED ORDER — DONEPEZIL HCL 10 MG PO TABS: ORAL_TABLET | ORAL | 3 refills | Status: DC

## 2019-11-09 MED ORDER — MEMANTINE HCL 10 MG PO TABS: 10.0000 mg | ORAL_TABLET | Freq: Two times a day (BID) | ORAL | 3 refills | Status: DC

## 2019-11-09 NOTE — Progress Notes (Signed)
Chief Complaint  Patient presents with  . Follow-up    rm 7  . Memory Loss    pt     HISTORY OF PRESENT ILLNESS: Today 11/09/19  Julie Martinez is Julie 86 y.o. female here today for follow up for memory loss. She continues Aricept 10mg  daily and Namenda 10mg  BID. She presents with her daughter, Julie Martinez. Julie Martinez reports that she is fairly stable with exception of not sleeping as well.  Daughter states that she seems to get more agitated around at home.  Most recently, she has been waking up multiple times during the night and hollering out to her.  Occasionally she is agitated throughout the day.  They have tried giving her melatonin which has not been very effective.  She continues to perform ADLs independently.  She is living with her grandson, Julie Martinez.  She does not drive.  She is using Julie walker.  No falls.   HISTORY (copied from my note on 11/09/2018)  Julie Martinez is Julie 86 y.o. female here today for follow up.  She is present with Julie Martinez, daughter, who aids in history.  She lives at home with her grandson, Julie Martinez, who helps with medications. No falls or changes in gait.  She does not use assistive devices with walking.  She is able to perform ADLs independently.  She does not drive.  She is feeling well today.  She continues to tolerate Aricept 10 mg at night and Namenda 10 mg twice daily.  HISTORY: (copied from Julie Martinez note on 01/04/2017)  12/2016: She has had falls. Here with her daughter who provides most information. Unclear why, she fell, she doesn't know why but she was bruised, she has fallen Julie few more times. She falls when she walks her dog she gets pulled down but refuses not to walk the dog. She misses medications. Memory is worsening. She has been having hallucinations, seeing Julie man in Julie tree behind the house. She still has depression. I recommend Julie memory unit for patient, daughter and patient decline. I advised 24x7 care, she has her grandchildren 24x7.    10/2015:Today October 20 13,017 Julie Martinez is an 86 year old female with Julie history of memory disturbance. She returns today for follow-up. She was started on Aricept and has been tolerating it well. She continues to live home alone. She is able to complete all ADLs independently. She doesoperate Julie motor vehicle but only drives short distances. Her daughter is with her today and reports that she has turned on the wrongroadswhen driving to familiar places. This does not happen often. She does have Julie hard time sleeping. She states that she isoften thinking of her deceased son. He passed away in 2014/07/28 from [place] disease. She does feel that she suffers from depression. Her primary care recently increase Zoloft to 3 times Julie day. Patient returns today for an evaluation.  SHF:WYOVZCH Julie Lawsonis Julie 86 y.o.femalehere as Julie referral from Julie. Pat Martinez memory problems. Past medical history of hypertension, anxiety and depression. Here with daughter who provides most information. Memory problems at least the last year. Has been worse since 2022/07/28 when her son passed away from [place]. Daughter noticed she forgot Julie funeral she went to of daughter's brother in law in November. Son was living his mother for many years since the late nighties. They noticed more memory problems since he died. Husband passed away in 1997/98. She has been depressed since her son died. She says his dog died  recently (daughter says the dog died many years ago). Patient pays the bills and she has paid all of them. No missing bills, she owns the house, all the utilities are paid. Daughter reminds mother and takes her to all appointments. Daughter goes over every day. No accidents in the home. Patient is alone Julie lot and she is depressed. atient repeats herself Julie lot, that has been going on for the last year. She doesn't remember telling daughter many things. Brother with dementia.   Reviewed notes, labs and imaging from outside  physicians, which showed:   Reviewed notes from [place]. Patient has had problems with memory, the Mini-Mental Status exam done on 1024 had Julie score of 25 out of 30. Patient has also had Julie number of losses recently her son died of ALS, her dog died and recently Julie son-in-law die. Patient had forgotten that the son-in-law died. Patient has Julie history of hypertension, uncertain if she is actually taking. She was prescribed in September 90 day prescription. She has not been checking her blood pressures at home. Diagnosed with memory problems and essential hypertension, mixed hyperlipidemia, anxiety and depression, dizziness. She was seen at the emergency room for dizziness and she was giving meclizine, the dizziness improved. 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 She is on sertraline and that dose was increased recently. She had reported she wasn't sleeping well. Notes noted that she had Julie sad mood and affect.  Personally reviewed images of the brain, MRI w/o 10/2013: 1. No acute intracranial abnormality. 2. 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     REVIEW OF SYSTEMS: Out of Julie complete 14 system review of symptoms, the patient complains only of the following symptoms, memory loss, agitation, restlessness, insomnia and all other reviewed systems are negative.  Flex sig ALLERGIES: No Known Allergies   HOME MEDICATIONS: Outpatient Medications Prior to Visit  Medication Sig Dispense Refill  . Acetaminophen (TYLENOL ARTHRITIS PAIN PO) Take by mouth.    Marland Kitchen alendronate (FOSAMAX) 70 MG tablet Take 70 mg by mouth once Julie week. Take with Julie full glass of water on an empty stomach.    Marland Kitchen ELIQUIS 5 MG TABS tablet Take 5 mg by mouth 2 (two) times daily.     . quinapril (ACCUPRIL) 20 MG tablet Take 20 mg by mouth at bedtime.    .  triamterene-hydrochlorothiazide (DYAZIDE) 37.5-25 MG capsule Take 1 capsule by mouth daily.  0  . aspirin 81 MG EC tablet Take 81 mg by mouth daily.    Marland Kitchen donepezil (ARICEPT) 10 MG tablet TAKE 1 TABLET BY MOUTH AT BEDTIME. 30 tablet 12  . memantine (NAMENDA) 10 MG tablet Take 1 tablet (10 mg total) by mouth 2 (two) times daily. 60 tablet 12   No facility-administered medications prior to visit.     PAST MEDICAL HISTORY: Past Medical History:  Diagnosis Date  . High cholesterol   . Hypertension   . Osteopenia      PAST SURGICAL HISTORY: Past Surgical History:  Procedure Laterality Date  . ABDOMINAL HYSTERECTOMY    . EYE SURGERY    . SHOULDER SURGERY  2017   plating for fracture     FAMILY HISTORY: Family History  Problem Relation Age of Onset  . Heart failure Mother      SOCIAL HISTORY: Social History   Socioeconomic History  .  Marital status: Widowed    Spouse name: Not on file  . Number of children: 3  . Years of education: 9  . Highest education level: High school graduate  Occupational History  . Occupation: n/Julie  Tobacco Use  . Smoking status: Never Smoker  . Smokeless tobacco: Never Used  Vaping Use  . Vaping Use: Never used  Substance and Sexual Activity  . Alcohol use: No  . Drug use: No  . Sexual activity: Not on file  Other Topics Concern  . Not on file  Social History Narrative   Patient lives at home alone. There is always Julie family member there 24/7.   Caffeine Use: 1 cup of coffee daily   Right handed   Social Determinants of Health   Financial Resource Strain:   . Difficulty of Paying Living Expenses: Not on file  Food Insecurity:   . Worried About Charity fundraiser in the Last Year: Not on file  . Ran Out of Food in the Last Year: Not on file  Transportation Needs:   . Lack of Transportation (Medical): Not on file  . Lack of Transportation (Non-Medical): Not on file  Physical Activity:   . Days of Exercise per Week: Not on file   . Minutes of Exercise per Session: Not on file  Stress:   . Feeling of Stress : Not on file  Social Connections:   . Frequency of Communication with Friends and Family: Not on file  . Frequency of Social Gatherings with Friends and Family: Not on file  . Attends Religious Services: Not on file  . Active Member of Clubs or Organizations: Not on file  . Attends Archivist Meetings: Not on file  . Marital Status: Not on file  Intimate Partner Violence:   . Fear of Current or Ex-Partner: Not on file  . Emotionally Abused: Not on file  . Physically Abused: Not on file  . Sexually Abused: Not on file      PHYSICAL EXAM  Vitals:   11/09/19 0938  BP: (!) 144/82  Pulse: 99  Weight: 115 lb (52.2 kg)  Height: 4\' 11"  (1.499 m)   Body mass index is 23.23 kg/m.   Generalized: Well developed, in no acute distress   Neurological examination  Mentation: Alert, she is not oriented to time, but is oriented to place, and some history taking. Follows all commands speech and language fluent Cranial nerve II-XII: Pupils were equal round reactive to light. Extraocular movements were full, visual field were full on confrontational test. Facial sensation and strength were normal. Head turning and shoulder shrug  were normal and symmetric. Motor: The motor testing reveals 5 over 5 strength of all 4 extremities. Good symmetric motor tone is noted throughout.  Gait and station: Gait is short but stable with walker      DIAGNOSTIC DATA (LABS, IMAGING, TESTING) - I reviewed patient records, labs, notes, testing and imaging myself where available.  Lab Results  Component Value Date   WBC 6.6 05/18/2019   HGB 11.7 (L) 05/18/2019   HCT 37.3 05/18/2019   MCV 99.7 05/18/2019   PLT 238 05/18/2019      Component Value Date/Time   NA 143 05/18/2019 1158   K 3.8 05/18/2019 1158   CL 110 05/18/2019 1158   CO2 26 05/18/2019 1158   GLUCOSE 85 05/18/2019 1158   BUN 22 05/18/2019 1158    CREATININE 0.92 05/18/2019 1158   CALCIUM 8.7 (L) 05/18/2019 1158  PROT 6.1 (L) 05/18/2019 1158   ALBUMIN 3.1 (L) 05/18/2019 1158   AST 14 (L) 05/18/2019 1158   ALT 10 05/18/2019 1158   ALKPHOS 66 05/18/2019 1158   BILITOT 0.3 05/18/2019 1158   GFRNONAA 56 (L) 05/18/2019 1158   GFRAA >60 05/18/2019 1158   No results found for: CHOL, HDL, LDLCALC, LDLDIRECT, TRIG, CHOLHDL No results found for: HGBA1C Lab Results  Component Value Date   VITAMINB12 315 01/25/2015   Lab Results  Component Value Date   TSH 1.200 01/25/2015    MMSE - Mini Mental State Exam 11/09/2019 11/09/2018 01/04/2017  Orientation to time 1 0 3  Orientation to Place 3 3 4   Registration 3 3 3   Attention/ Calculation 3 2 5   Recall 0 0 0  Language- name 2 objects 2 2 2   Language- repeat 1 1 1   Language- follow 3 step command 1 3 3   Language- read & follow direction 1 1 1   Write Julie sentence 1 1 1   Copy design 1 1 1   Total score 17 17 24     ASSESSMENT AND PLAN  86 y.o. year old female  has Julie past medical history of High cholesterol, Hypertension, and Osteopenia. here with   Dementia without behavioral disturbance, unspecified dementia type ([place]) - Plan: memantine (NAMENDA) 10 MG tablet, donepezil (ARICEPT) 10 MG tablet  Agitation  Insomnia, unspecified type  Rickeya is doing fairly well from Julie memory standpoint.  MMSE is stable at 17 of 30.  Her daughter does have concerns of increased agitation, specifically around bedtime.  She is having much more difficulty staying asleep.  We have discussed safety profile of sleep aids and antipsychotics.  Julie Martinez would like to try Julie low-dose of Seroquel.  We will start 12.5 mg nightly about 30 minutes before bedtime.  She was educated on potential side effects and advised to monitor for these very closely.  Safety and fall precautions reviewed.  Healthy lifestyle habits encouraged.  She will focus on trying to be Julie little more active.  She will continue brain games as  directed.  She will follow-up closely with her primary care.  She will follow up with me in 3 to 6 months.  She verbalizes understanding and agreement with this plan.  I spent 20 minutes of face-to-face and non-face-to-face time with patient.  This included previsit chart review, lab review, study review, order entry, electronic health record documentation, patient education.    Debbora Presto, MSN, FNP-C 11/09/2019, 10:35 AM  [facility] [address], [place], [place] [address] [phone]   Made any corrections needed, and agree with history, physical, neuro exam,assessment and plan as stated.     Sarina Ill, MD [facility]

## 2019-11-09 NOTE — Patient Instructions (Addendum)
Continue Aricept 10mg  daily and Namenda 10mg  twice daily. We will start Seroquel 12.5 (1/2 tablet) daily for sleep and agitation. Please monitor closely for any side effects as discussed in the office.   Stay well hydrated. Well balanced diet and regular exercise encouraged.   Follow up closely with PCP.   Follow up with me in 3-6 month    Memory Compensation Strategies  1. Use "WARM" strategy.  W= write it down  A= associate it  R= repeat it  M= make a mental note  2.   You can keep a Social worker.  Use a 3-ring notebook with sections for the following: calendar, important names and phone numbers,  medications, doctors' names/phone numbers, lists/reminders, and a section to journal what you did  each day.   3.    Use a calendar to write appointments down.  4.    Write yourself a schedule for the day.  This can be placed on the calendar or in a separate section of the Memory Notebook.  Keeping a  regular schedule can help memory.  5.    Use medication organizer with sections for each day or morning/evening pills.  You may need help loading it  6.    Keep a basket, or pegboard by the door.  Place items that you need to take out with you in the basket or on the pegboard.  You may also want to  include a message board for reminders.  7.    Use sticky notes.  Place sticky notes with reminders in a place where the task is performed.  For example: " turn off the  stove" placed by the stove, "lock the door" placed on the door at eye level, " take your medications" on  the bathroom mirror or by the place where you normally take your medications.  8.    Use alarms/timers.  Use while cooking to remind yourself to check on food or as a reminder to take your medicine, or as a  reminder to make a call, or as a reminder to perform another task, etc.   Quetiapine tablets What is this medicine? QUETIAPINE (kwe TYE a peen) is an antipsychotic. It is used to treat schizophrenia and  bipolar disorder, also known as manic-depression. This medicine may be used for other purposes; ask your health care provider or pharmacist if you have questions. COMMON BRAND NAME(S): Seroquel What should I tell my health care provider before I take this medicine? They need to know if you have any of these conditions:  blockage in your bowel  cataracts  constipation  dehydration  diabetes  difficulty swallowing  glaucoma  heart disease  history of breast cancer  kidney disease  liver disease  low blood counts, like low white cell, platelet, or red cell counts  low blood pressure or dizziness when standing up  Parkinson's disease  previous heart attack  prostate disease  seizures  stomach or intestine problems  suicidal thoughts, plans or attempt; a previous suicide attempt by you or a family member  thyroid disease  trouble passing urine  an unusual or allergic reaction to quetiapine, other medicines, foods, dyes, or preservatives  pregnant or trying to get pregnant  breast-feeding How should I use this medicine? Take this medicine by mouth. Swallow it with a drink of water. Follow the directions on the prescription label. If it upsets your stomach you can take it with food. Take your medicine at regular intervals. Do not take  it more often than directed. Do not stop taking except on the advice of your doctor or health care professional. A special MedGuide will be given to you by the pharmacist with each prescription and refill. Be sure to read this information carefully each time. Talk to your pediatrician regarding the use of this medicine in children. While this drug may be prescribed for children as young as 10 years for selected conditions, precautions do apply. Patients over age 69 years may have a stronger reaction to this medicine and need smaller doses. Overdosage: If you think you have taken too much of this medicine contact a poison control center  or emergency room at once. NOTE: This medicine is only for you. Do not share this medicine with others. What if I miss a dose? If you miss a dose, take it as soon as you can. If it is almost time for your next dose, take only that dose. Do not take double or extra doses. What may interact with this medicine? Do not take this medicine with any of the following medications:  cisapride  dronedarone  fluconazole  metoclopramide  pimozide  posaconazole  thioridazine This medicine may also interact with the following medications:  alcohol  antihistamines for allergy cough and cold  antiviral medicines for HIV or AIDS  atropine  certain medicines for bladder problems like oxybutynin, tolterodine  certain medicines for blood pressure  certain medicines for depression, anxiety, or psychotic disturbances  certain medicines for diabetes  certain medicines for stomach problems like dicyclomine, hyoscyamine  certain medicines for travel sickness like scopolamine  certain medicines for Parkinson's disease  certain medicines for seizures like carbamazepine, phenobarbital, phenytoin  cimetidine  erythromycin  ipratropium  other medicines that prolong the QT interval (cause an abnormal heart rhythm) like dofetilide  rifampin  steroid medicines like prednisone or cortisone This list may not describe all possible interactions. Give your health care provider a list of all the medicines, herbs, non-prescription drugs, or dietary supplements you use. Also tell them if you smoke, drink alcohol, or use illegal drugs. Some items may interact with your medicine. What should I watch for while using this medicine? Visit your health care professional for regular checks on your progress. Tell your health care professional if symptoms do not start to get better or if they get worse. Do not stop taking except on your health care professional's advice. You may develop a severe reaction.  Your health care professional will tell you how much medicine to take. You may need to have an eye exam before and during use of this medicine. This medicine may increase blood sugar. Ask your health care provider if changes in diet or medicines are needed if you have diabetes. Patients and their families should watch out for new or worsening depression or thoughts of suicide. Also watch out for sudden or severe changes in feelings such as feeling anxious, agitated, panicky, irritable, hostile, aggressive, impulsive, severely restless, overly excited and hyperactive, or not being able to sleep. If this happens, especially at the beginning of antidepressant treatment or after a change in dose, call your health care professional. Julie Martinez may get dizzy or drowsy. Do not drive, use machinery, or do anything that needs mental alertness until you know how this medicine affects you. Do not stand or sit up quickly, especially if you are an older patient. This reduces the risk of dizzy or fainting spells. Alcohol may interfere with the effect of this medicine. Avoid alcoholic drinks. This  drug can cause problems with controlling your body temperature. It can lower the response of your body to cold temperatures. If possible, stay indoors during cold weather. If you must go outdoors, wear warm clothes. It can also lower the response of your body to heat. Do not overheat. Do not over-exercise. Stay out of the sun when possible. If you must be in the sun, wear cool clothing. Drink plenty of water. If you have trouble controlling your body temperature, call your health care provider right away. What side effects may I notice from receiving this medicine? Side effects that you should report to your doctor or health care professional as soon as possible:  allergic reactions like skin rash, itching or hives, swelling of the face, lips, or tongue  breathing problems  changes in vision  confusion  elevated mood, decreased  need for sleep, racing thoughts, impulsive behavior  eye pain  fast, irregular heartbeat  fever or chills, sore throat  inability to keep still  males: prolonged or painful erection  problems with balance, talking, walking  redness, blistering, peeling, or loosening of the skin, including inside the mouth  seizures  signs and symptoms of high blood sugar such as being more thirsty or hungry or having to urinate more than normal. You may also feel very tired or have blurry vision  signs and symptoms of hypothyroidism like fatigue; increased sensitivity to cold; weight gain; hoarseness; thinning hair  signs and symptoms of low blood pressure like dizziness; feeling faint or lightheaded; falls; unusually weak or tired  signs and symptoms of neuroleptic malignant syndrome like confusion; fast, irregular heartbeat; high fever; increased sweating; stiff muscles  sudden numbness or weakness of the face, arm, or leg  suicidal thoughts or other mood changes  trouble swallowing  uncontrollable movements of the arms, face, head, mouth, neck, or upper body Side effects that usually do not require medical attention (report to your doctor or health care professional if they continue or are bothersome):  change in sex drive or performance  constipation  drowsiness  dry mouth  upset stomach  weight gain This list may not describe all possible side effects. Call your doctor for medical advice about side effects. You may report side effects to FDA at 1-800-FDA-1088. Where should I keep my medicine? Keep out of the reach of children. Store at room temperature between 15 and 30 degrees C (59 and 86 degrees F). Throw away any unused medicine after the expiration date. NOTE: This sheet is a summary. It may not cover all possible information. If you have questions about this medicine, talk to your doctor, pharmacist, or health care provider.  2020 Elsevier/Gold Standard (2018-11-02  11:59:11)   Dementia Dementia is a condition that affects the way the brain functions. It often affects memory and thinking. Usually, dementia gets worse with time and cannot be reversed (progressive dementia). There are many types of dementia, including:  Alzheimer's disease. This type is the most common.  Vascular dementia. This type may happen as the result of a stroke.  Lewy body dementia. This type may happen to people who have Parkinson's disease.  Frontotemporal dementia. This type is caused by damage to nerve cells (neurons) in certain parts of the brain. Some people may be affected by more than one type of dementia. This is called mixed dementia. What are the causes? Dementia is caused by damage to cells in the brain. The area of the brain and the types of cells damaged determine the type  of dementia. Usually, this damage is irreversible or cannot be undone. Some examples of irreversible causes include:  Conditions that affect the blood vessels of the brain, such as diabetes, heart disease, or blood vessel disease.  Genetic mutations. In some cases, changes in the brain may be caused by another condition and can be reversed or slowed. Some examples of reversible causes include:  Injury to the brain.  Certain medicines.  Infection, such as meningitis.  Metabolic problems, such as vitamin B12 deficiency or thyroid disease.  Pressure on the brain, such as from a tumor or blood clot. What are the signs or symptoms? Symptoms of dementia depend on the type of dementia. Common signs of dementia include problems with remembering, thinking, problem solving, decision making, and communicating. These signs develop slowly or get worse with time. This may include:  Problems remembering things.  Having trouble taking a bath or putting clothes on.  Forgetting appointments.  Forgetting to pay bills.  Difficulty planning and preparing meals.  Having trouble speaking.  Getting  lost easily. How is this diagnosed? This condition is diagnosed by a specialist (neurologist). It is diagnosed based on the history of your symptoms, your medical history, a physical exam, and tests. Tests may include:  Tests to evaluate brain function, such as memory tests, cognitive tests, and other tests.  Lab tests, such as blood or urine tests.  Imaging tests, such as a CT scan, a PET scan, or an MRI.  Genetic testing. This may be done if other family members have a diagnosis of certain types of dementia. Your health care provider will talk with you and your family, friends, or caregivers about your history and symptoms. How is this treated?  Treatment for this condition depends on the cause of the dementia. Progressive dementias, such as Alzheimer's disease, cannot be cured, but there may be treatments that help to manage symptoms. Treatment might involve taking medicines that may help to:  Control the dementia.  Slow down the progression of the dementia.  Manage symptoms. In some cases, treating the cause of your dementia can improve symptoms, reverse symptoms, or slow down how quickly your dementia becomes worse. Your health care provider can direct you to support groups, organizations, and other health care providers who can help with decisions about your care. Follow these instructions at home: Medicines  Take over-the-counter and prescription medicines only as told by your health care provider.  Use a pill organizer or pill reminder to help you manage your medicines.  Avoid taking medicines that can affect thinking, such as pain medicines or sleeping medicines. Lifestyle  Make healthy lifestyle choices. ? Be physically active as told by your health care provider. ? Do not use any products that contain nicotine or tobacco, such as cigarettes, e-cigarettes, and chewing tobacco. If you need help quitting, ask your health care provider. ? Do not drink alcohol. ? Practice  stress-management techniques when you get stressed. ? Spend time with other people.  Make sure to get quality sleep. These tips can help you get a good night's rest: ? Avoid napping during the day. ? Keep your sleeping area dark and cool. ? Avoid exercising during the few hours before you go to bed. ? Avoid caffeine products in the evening. Eating and drinking  Drink enough fluid to keep your urine pale yellow.  Eat a healthy diet. General instructions   Work with your health care provider to determine what you need help with and what your safety needs are.  Talk with your health care provider about whether it is safe for you to drive.  If you were given a bracelet that identifies you as a person with memory loss or tracks your location, make sure to wear it at all times.  Work with your family to make important decisions, such as advance directives, medical power of attorney, or a living will.  Keep all follow-up visits as told by your health care provider. This is important. Where to find more information  Alzheimer's Association: CapitalMile.co.nz  National Institute on Aging: DVDEnthusiasts.nl  World Health Organization: RoleLink.com.br Contact a health care provider if:  You have any new or worsening symptoms.  You have problems with choking or swallowing. Get help right away if:  You feel depressed or sad, or feel that you want to harm yourself.  Your family members become concerned for your safety. If you ever feel like you may hurt yourself or others, or have thoughts about taking your own life, get help right away. You can go to your nearest emergency department or call:  Your local emergency services (911 in the [place]  A suicide crisis helpline, such as the [facility] at 313-693-0321. This is open 24 hours a day. Summary  Dementia is a condition that affects the way the brain functions. Dementia often affects memory and  thinking.  Usually, dementia gets worse with time and cannot be reversed (progressive dementia).  Treatment for this condition depends on the cause of the dementia.  Work with your health care provider to determine what you need help with and what your safety needs are.  Your health care provider can direct you to support groups, organizations, and other health care providers who can help with decisions about your care. This information is not intended to replace advice given to you by your health care provider. Make sure you discuss any questions you have with your health care provider. Document Revised: 03/22/2018 Document Reviewed: 03/22/2018 Elsevier Patient Education  [place].

## 2019-12-08 DIAGNOSIS — M8580 Other specified disorders of bone density and structure, unspecified site: Secondary | ICD-10-CM | POA: Diagnosis not present

## 2019-12-08 DIAGNOSIS — F0390 Unspecified dementia without behavioral disturbance: Secondary | ICD-10-CM | POA: Diagnosis not present

## 2019-12-08 DIAGNOSIS — E782 Mixed hyperlipidemia: Secondary | ICD-10-CM | POA: Diagnosis not present

## 2019-12-08 DIAGNOSIS — I10 Essential (primary) hypertension: Secondary | ICD-10-CM | POA: Diagnosis not present

## 2019-12-08 DIAGNOSIS — F339 Major depressive disorder, recurrent, unspecified: Secondary | ICD-10-CM | POA: Diagnosis not present

## 2019-12-08 DIAGNOSIS — R54 Age-related physical debility: Secondary | ICD-10-CM | POA: Diagnosis not present

## 2020-01-09 DIAGNOSIS — I10 Essential (primary) hypertension: Secondary | ICD-10-CM | POA: Diagnosis not present

## 2020-01-09 DIAGNOSIS — F0390 Unspecified dementia without behavioral disturbance: Secondary | ICD-10-CM | POA: Diagnosis not present

## 2020-01-09 DIAGNOSIS — M8580 Other specified disorders of bone density and structure, unspecified site: Secondary | ICD-10-CM | POA: Diagnosis not present

## 2020-01-09 DIAGNOSIS — E782 Mixed hyperlipidemia: Secondary | ICD-10-CM | POA: Diagnosis not present

## 2020-01-09 DIAGNOSIS — F339 Major depressive disorder, recurrent, unspecified: Secondary | ICD-10-CM | POA: Diagnosis not present

## 2020-01-09 DIAGNOSIS — R54 Age-related physical debility: Secondary | ICD-10-CM | POA: Diagnosis not present

## 2020-01-27 DIAGNOSIS — S81812A Laceration without foreign body, left lower leg, initial encounter: Secondary | ICD-10-CM | POA: Diagnosis not present

## 2020-01-27 DIAGNOSIS — Z7901 Long term (current) use of anticoagulants: Secondary | ICD-10-CM | POA: Diagnosis not present

## 2020-01-27 DIAGNOSIS — F0390 Unspecified dementia without behavioral disturbance: Secondary | ICD-10-CM | POA: Diagnosis not present

## 2020-01-27 DIAGNOSIS — Z7982 Long term (current) use of aspirin: Secondary | ICD-10-CM | POA: Diagnosis not present

## 2020-01-27 DIAGNOSIS — I10 Essential (primary) hypertension: Secondary | ICD-10-CM | POA: Diagnosis not present

## 2020-01-27 DIAGNOSIS — Z79899 Other long term (current) drug therapy: Secondary | ICD-10-CM | POA: Diagnosis not present

## 2020-02-07 DIAGNOSIS — F0390 Unspecified dementia without behavioral disturbance: Secondary | ICD-10-CM | POA: Diagnosis not present

## 2020-02-07 DIAGNOSIS — F339 Major depressive disorder, recurrent, unspecified: Secondary | ICD-10-CM | POA: Diagnosis not present

## 2020-02-07 DIAGNOSIS — M8580 Other specified disorders of bone density and structure, unspecified site: Secondary | ICD-10-CM | POA: Diagnosis not present

## 2020-02-07 DIAGNOSIS — R54 Age-related physical debility: Secondary | ICD-10-CM | POA: Diagnosis not present

## 2020-02-07 DIAGNOSIS — I10 Essential (primary) hypertension: Secondary | ICD-10-CM | POA: Diagnosis not present

## 2020-02-07 DIAGNOSIS — E782 Mixed hyperlipidemia: Secondary | ICD-10-CM | POA: Diagnosis not present

## 2020-02-08 DIAGNOSIS — Z7901 Long term (current) use of anticoagulants: Secondary | ICD-10-CM | POA: Diagnosis not present

## 2020-02-08 DIAGNOSIS — S80819D Abrasion, unspecified lower leg, subsequent encounter: Secondary | ICD-10-CM | POA: Diagnosis not present

## 2020-02-08 DIAGNOSIS — Z79899 Other long term (current) drug therapy: Secondary | ICD-10-CM | POA: Diagnosis not present

## 2020-02-08 DIAGNOSIS — I2699 Other pulmonary embolism without acute cor pulmonale: Secondary | ICD-10-CM | POA: Diagnosis not present

## 2020-03-12 ENCOUNTER — Encounter: Payer: Self-pay | Admitting: Family Medicine

## 2020-03-12 ENCOUNTER — Ambulatory Visit: Payer: Medicare HMO | Admitting: Family Medicine

## 2020-03-12 ENCOUNTER — Other Ambulatory Visit: Payer: Self-pay

## 2020-03-12 DIAGNOSIS — F0390 Unspecified dementia without behavioral disturbance: Secondary | ICD-10-CM

## 2020-03-12 MED ORDER — DONEPEZIL HCL 10 MG PO TABS: ORAL_TABLET | ORAL | 3 refills | Status: DC

## 2020-03-12 MED ORDER — MEMANTINE HCL 10 MG PO TABS: 10.0000 mg | ORAL_TABLET | Freq: Two times a day (BID) | ORAL | 3 refills | Status: DC

## 2020-03-12 NOTE — Patient Instructions (Signed)
Below is our plan:  We will continue Aricept and Namenda. Continue close follow up with PCP. Stay active.   Please make sure you are staying well hydrated. I recommend 50-60 ounces daily. Well balanced diet and regular exercise encouraged. Consistent sleep schedule with 6-8 hours recommended.   Please continue follow up with care team as directed.   Follow up with me in 1 year, sooner if needed   You may receive a survey regarding today's visit. I encourage you to leave honest feed back as I do use this information to improve patient care. Thank you for seeing me today!       Dementia Caregiver Guide Dementia is a term used to describe a number of symptoms that affect memory and thinking. The most common symptoms include:  Memory loss.  Trouble with language and communication.  Trouble concentrating.  Poor judgment and problems with reasoning.  Wandering from home or public places.  Extreme anxiety or depression.  Being suspicious or having angry outbursts and accusations.  Child-like behavior and language. Dementia can be frightening and confusing. And taking care of someone with dementia can be challenging. This guide provides tips to help you when providing care for a person with dementia. How to help manage lifestyle changes Dementia usually gets worse slowly over time. In the early stages, people with dementia can stay independent and safe with some help. In later stages, they need help with daily tasks such as dressing, grooming, and using the bathroom. There are actions you can take to help a person manage his or her life while living with this condition. Communicating  When the person is talking or seems frustrated, make eye contact and hold the person's hand.  Ask specific questions that need yes or no answers.  Use simple words, short sentences, and a calm voice. Only give one direction at a time.  When offering choices, limit the person to just one or  two.  Avoid correcting the person in a negative way.  If the person is struggling to find the right words, gently try to help him or her. Preventing injury  Keep floors clear of clutter. Remove rugs, magazine racks, and floor lamps.  Keep hallways well lit, especially at night.  Put a handrail and nonslip mat in the bathtub or shower.  Put childproof locks on cabinets that contain dangerous items, such as medicines, alcohol, guns, toxic cleaning items, sharp tools or utensils, matches, and lighters.  For doors to the outside of the house, put the locks in places where the person cannot see or reach them easily. This will help ensure that the person does not wander out of the house and get lost.  Be prepared for emergencies. Keep a list of emergency phone numbers and addresses in a convenient area.  Remove car keys and lock garage doors so that the person does not try to get in the car and drive.  Have the person wear a bracelet that tracks locations and identifies the person as having memory problems. This should be worn at all times for safety.   Helping with daily life  Keep the person on track with his or her routine.  Try to identify areas where the person may need help.  Be supportive, patient, calm, and encouraging.  Gently remind the person that adjusting to changes takes time.  Help with the tasks that the person has asked for help with.  Keep the person involved in daily tasks and decisions as much as  possible.  Encourage conversation, but try not to get frustrated if the person struggles to find words or does not seem to appreciate your help.   How to recognize stress Look for signs of stress in yourself and in the person you are caring for. If you notice signs of stress, take steps to manage it. Symptoms of stress include:  Feeling anxious, irritable, frustrated, or angry.  Denying that the person has dementia or that his or her symptoms will not  improve.  Feeling depressed, hopeless, or unappreciated.  Difficulty sleeping.  Difficulty concentrating.  Developing stress-related health problems.  Feeling like you have too little time for your own life. Follow these instructions at home: Take care of your health Make sure that you and the person you are caring for:  Get regular sleep.  Exercise regularly.  Eat regular, nutritious meals.  Take over-the-counter and prescription medicines only as told by your health care providers.  Drink enough fluid to keep your urine pale yellow.  Attend all scheduled health care appointments.   General instructions  Join a support group with others who are caregivers.  Ask about respite care resources. Respite care can provide short-term care for the person so that you can have a regular break from the stress of caregiving.  Consider any safety risks and take steps to avoid them.  Organize medicines in a pill box for each day of the week.  Create a plan to handle any legal or financial matters. Get legal or financial advice if needed.  Keep a calendar in a central location to remind the person of appointments or other activities. Where to find support: Many individuals and organizations offer support. These include:  Support groups for people with dementia.  Support groups for caregivers.  Counselors or therapists.  Home health care services.  Adult day care centers. Where to find more information  Centers for Disease Control and Prevention: http://www.wolf.info/  Alzheimer's Association: CapitalMile.co.nz  Family Caregiver Alliance: www.caregiver.[place]: www.alzfdn.org Contact a health care provider if:  The person's health is rapidly getting worse.  You are no longer able to care for the person.  Caring for the person is affecting your physical and emotional health.  You are feeling depressed or anxious about caring for the person. Get help  right away if:  The person threatens himself or herself, you, or anyone else.  You feel depressed or sad, or feel that you want to harm yourself. If you ever feel like your loved one may hurt himself or herself or others, or if he or she shares thoughts about taking his or her own life, get help right away. You can go to your nearest emergency department or:  Call your local emergency services (911 in the [place]  Call a suicide crisis helpline, such as the [facility] at 202-231-9071. This is open 24 hours a day in the [place]  Text the Crisis Text Line at 908-184-0007 (in the [place].). Summary  Dementia is a term used to describe a number of symptoms that affect memory and thinking.  Dementia usually gets worse slowly over time.  Take steps to reduce the person's risk of injury and to plan for future care.  Caregivers need support, relief from caregiving, and time for their own lives. This information is not intended to replace advice given to you by your health care provider. Make sure you discuss any questions you have with your health care provider. Document Revised: 05/22/2019  Document Reviewed: 05/22/2019 Elsevier Patient Education  [place].

## 2020-03-12 NOTE — Progress Notes (Addendum)
Chief Complaint  Patient presents with  . Follow-up    Rm 1 with daughter (donna) Pt memeory is declining per daughter      HISTORY OF PRESENT ILLNESS: 03/12/20 ALL:  She returns for follow up for dementia. At last visit, we otped to start Seroquel for concerns of agitation and difficulty sleeping. Family decided not to start this medication. She has continues Aricept and Namenda. Sleep concerns have improved. She seems to be resting well. She continues to have progressive memory difficulty but seems happy, otherwise. She is eating normally. She is not very active. No falls. She does not drive. She is followed regularly by PCP.   11/08/2020 ALL:  Julie Martinez is a 87 y.o. female here today for follow up for memory loss. She continues Aricept 10mg  daily and Namenda 10mg  BID. She presents with her daughter, [name]. [name] reports that she is fairly stable with exception of not sleeping as well.  Daughter states that she seems to get more agitated around at home.  Most recently, she has been waking up multiple times during the night and hollering out to her.  Occasionally she is agitated throughout the day.  They have tried giving her melatonin which has not been very effective.  She continues to perform ADLs independently.  She is living with her grandson, [name].  She does not drive.  She is using a walker.  No falls.   HISTORY (copied from my note on 11/09/2018)  Julie Martinez is a 87 y.o. female here today for follow up.  She is present with [name], daughter, who aids in history.  She lives at home with her grandson, [name], who helps with medications. No falls or changes in gait.  She does not use assistive devices with walking.  She is able to perform ADLs independently.  She does not drive.  She is feeling well today.  She continues to tolerate Aricept 10 mg at night and Namenda 10 mg twice daily.  HISTORY: (copied from Dr Cathren Laine note on 01/04/2017)  12/2016: She has had falls.  Here with her daughter who provides most information. Unclear why, she fell, she doesn't know why but she was bruised, she has fallen a few more times. She falls when she walks her dog she gets pulled down but refuses not to walk the dog. She misses medications. Memory is worsening. She has been having hallucinations, seeing a man in a tree behind the house. She still has depression. I recommend a memory unit for patient, daughter and patient decline. I advised 24x7 care, she has her grandchildren 24x7.   10/2015:Today October 20 13,017 Julie Martinez is an 87 year old female with a history of memory disturbance. She returns today for follow-up. She was started on Aricept and has been tolerating it well. She continues to live home alone. She is able to complete all ADLs independently. She doesoperate a motor vehicle but only drives short distances. Her daughter is with her today and reports that she has turned on the wrongroadswhen driving to familiar places. This does not happen often. She does have a hard time sleeping. She states that she isoften thinking of her deceased son. He passed away in 08-04-14 from [place] disease. She does feel that she suffers from depression. Her primary care recently increase Zoloft to 3 times a day. Patient returns today for an evaluation.  Julie A Lawsonis a 87 y.o.femalehere as a referral from Dr. Pat Kocher memory problems. Past medical history of  hypertension, anxiety and depression. Here with daughter who provides most information. Memory problems at least the last year. Has been worse since 2022/07/14 when her son passed away from [place]. Daughter noticed she forgot a funeral she went to of daughter's brother in law in November. Son was living his mother for many years since the late nighties. They noticed more memory problems since he died. Husband passed away in 1997/98. She has been depressed since her son died. She says his dog died recently (daughter says  the dog died many years ago). Patient pays the bills and she has paid all of them. No missing bills, she owns the house, all the utilities are paid. Daughter reminds mother and takes her to all appointments. Daughter goes over every day. No accidents in the home. Patient is alone a lot and she is depressed. atient repeats herself a lot, that has been going on for the last year. She doesn't remember telling daughter many things. Brother with dementia.   Reviewed notes, labs and imaging from outside physicians, which showed:   Reviewed notes from [place]. Patient has had problems with memory, the Mini-Mental Status exam done on 1024 had a score of 25 out of 30. Patient has also had a number of losses recently her son died of ALS, her dog died and recently a son-in-law die. Patient had forgotten that the son-in-law died. Patient has a history of hypertension, uncertain if she is actually taking. She was prescribed in September 90 day prescription. She has not been checking her blood pressures at home. Diagnosed with memory problems and essential hypertension, mixed hyperlipidemia, anxiety and depression, dizziness. She was seen at the emergency room for dizziness and she was giving meclizine, the dizziness improved. 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 She is on sertraline and that dose was increased recently. She had reported she wasn't sleeping well. Notes noted that she had a sad mood and affect.  Personally reviewed images of the brain, MRI w/o 10/2013: 1. No acute intracranial abnormality. 2. 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     REVIEW OF SYSTEMS: Out of a complete 14 system review of symptoms, the patient complains only of the following symptoms, memory loss, and all other reviewed systems are negative.  Flex sig ALLERGIES: No Known  Allergies   HOME MEDICATIONS: Outpatient Medications Prior to Visit  Medication Sig Dispense Refill  . Acetaminophen (TYLENOL ARTHRITIS PAIN PO) Take by mouth.    Marland Kitchen alendronate (FOSAMAX) 70 MG tablet Take 70 mg by mouth once a week. Take with a full glass of water on an empty stomach.    . donepezil (ARICEPT) 10 MG tablet TAKE 1 TABLET BY MOUTH AT BEDTIME. 90 tablet 3  . ELIQUIS 5 MG TABS tablet Take 5 mg by mouth 2 (two) times daily.     . memantine (NAMENDA) 10 MG tablet Take 1 tablet (10 mg total) by mouth 2 (two) times daily. 180 tablet 3  . quinapril (ACCUPRIL) 20 MG tablet Take 20 mg by mouth at bedtime.    . triamterene-hydrochlorothiazide (DYAZIDE) 37.5-25 MG capsule Take 1 capsule by mouth daily.  0  . QUEtiapine (SEROQUEL) 25 MG tablet Take 0.5 tablets (12.5 mg total) by mouth at bedtime. 45 tablet 3   No facility-administered medications prior to visit.     PAST MEDICAL HISTORY: Past Medical History:  Diagnosis Date  . High cholesterol   . Hypertension   . Osteopenia      PAST SURGICAL HISTORY: Past Surgical History:  Procedure Laterality Date  . ABDOMINAL HYSTERECTOMY    . EYE SURGERY    . SHOULDER SURGERY  2017   plating for fracture     FAMILY HISTORY: Family History  Problem Relation Age of Onset  . Heart failure Mother      SOCIAL HISTORY: Social History   Socioeconomic History  . Marital status: Widowed    Spouse name: Not on file  . Number of children: 3  . Years of education: 43  . Highest education level: High school graduate  Occupational History  . Occupation: n/a  Tobacco Use  . Smoking status: Never Smoker  . Smokeless tobacco: Never Used  Vaping Use  . Vaping Use: Never used  Substance and Sexual Activity  . Alcohol use: No  . Drug use: No  . Sexual activity: Not on file  Other Topics Concern  . Not on file  Social History Narrative   Patient lives at home alone. There is always a family member there 24/7.   Caffeine Use: 1  cup of coffee daily   Right handed   Social Determinants of Health   Financial Resource Strain: Not on file  Food Insecurity: Not on file  Transportation Needs: Not on file  Physical Activity: Not on file  Stress: Not on file  Social Connections: Not on file  Intimate Partner Violence: Not on file      PHYSICAL EXAM  Vitals:   03/12/20 1046  BP: (!) 155/70  Pulse: 61  Weight: 126 lb (57.2 kg)  Height: 5' (1.524 m)   Body mass index is 24.61 kg/m.   Generalized: Well developed, in no acute distress   Neurological examination  Mentation: Alert, she is not oriented to time, but is oriented to place, and some history taking. Follows all commands speech and language fluent Cranial nerve II-XII: Pupils were equal round reactive to light. Extraocular movements were full, visual field were full on confrontational test. Facial sensation and strength were normal. Head turning and shoulder shrug  were normal and symmetric. Motor: The motor testing reveals 5 over 5 strength of all 4 extremities. Good symmetric motor tone is noted throughout.  Gait and station: Gait is short but stable with no assistive device    DIAGNOSTIC DATA (LABS, IMAGING, TESTING) - I reviewed patient records, labs, notes, testing and imaging myself where available.  Lab Results  Component Value Date   WBC 6.6 05/18/2019   HGB 11.7 (L) 05/18/2019   HCT 37.3 05/18/2019   MCV 99.7 05/18/2019   PLT 238 05/18/2019      Component Value Date/Time   NA 143 05/18/2019 1158   K 3.8 05/18/2019 1158   CL 110 05/18/2019 1158   CO2 26 05/18/2019 1158   GLUCOSE 85 05/18/2019 1158   BUN 22 05/18/2019 1158   CREATININE 0.92 05/18/2019 1158   CALCIUM 8.7 (L) 05/18/2019 1158   PROT 6.1 (L) 05/18/2019 1158   ALBUMIN 3.1 (L) 05/18/2019 1158   AST 14 (L) 05/18/2019 1158   ALT 10 05/18/2019 1158   ALKPHOS 66 05/18/2019 1158   BILITOT 0.3 05/18/2019 1158   GFRNONAA 56 (L) 05/18/2019 1158   GFRAA >60 05/18/2019  1158   No results found for: CHOL, HDL, LDLCALC, LDLDIRECT, TRIG, CHOLHDL No results found for: HGBA1C Lab Results  Component Value Date   VITAMINB12 315  01/25/2015   Lab Results  Component Value Date   TSH 1.200 01/25/2015    MMSE - Mini Mental State Exam 03/12/2020 11/09/2019 11/09/2018  Orientation to time 1 1 0  Orientation to Place 3 3 3   Registration 3 3 3   Attention/ Calculation 2 3 2   Recall 0 0 0  Language- name 2 objects 2 2 2   Language- repeat 0 1 1  Language- follow 3 step command 3 1 3   Language- read & follow direction 1 1 1   Write a sentence 1 1 1   Copy design 0 1 1  Total score 16 17 17     ASSESSMENT AND PLAN  87 y.o. year old female  has a past medical history of High cholesterol, Hypertension, and Osteopenia. here with   No diagnosis found.  Julie Martinez is doing fairly well from a memory standpoint.  MMSE is stable at 16 of 30.  Her daughter was concerned about starting Seroquel and reports that Lesha is sleeping fairly well. We will continue to monitor. She will continue Namenda 10mg  BID and Aricept 10mg  daily. Healthy lifestyle habits encouraged. Dementia resources for caregiver reviewed. She will continue regular follow up with PCP. She will return to see [place] in 1 year, sooner if needed. [name] verbalizes understanding and agreement with this plan.   I spent 20 minutes of face-to-face and non-face-to-face time with patient.  This included previsit chart review, lab review, study review, order entry, electronic health record documentation, patient education.    Debbora Presto, MSN, FNP-C 03/12/2020, 11:09 AM  [facility] [address] [place], [place] [address] [phone]   Made any corrections needed, and agree with history, physical, neuro exam,assessment and plan as stated.     Sarina Ill, MD [facility]

## 2020-05-24 DIAGNOSIS — K529 Noninfective gastroenteritis and colitis, unspecified: Secondary | ICD-10-CM | POA: Diagnosis not present

## 2020-06-13 DIAGNOSIS — R54 Age-related physical debility: Secondary | ICD-10-CM | POA: Diagnosis not present

## 2020-06-13 DIAGNOSIS — I10 Essential (primary) hypertension: Secondary | ICD-10-CM | POA: Diagnosis not present

## 2020-06-13 DIAGNOSIS — M8580 Other specified disorders of bone density and structure, unspecified site: Secondary | ICD-10-CM | POA: Diagnosis not present

## 2020-06-13 DIAGNOSIS — E782 Mixed hyperlipidemia: Secondary | ICD-10-CM | POA: Diagnosis not present

## 2020-06-13 DIAGNOSIS — F339 Major depressive disorder, recurrent, unspecified: Secondary | ICD-10-CM | POA: Diagnosis not present

## 2020-06-13 DIAGNOSIS — F0390 Unspecified dementia without behavioral disturbance: Secondary | ICD-10-CM | POA: Diagnosis not present

## 2020-08-08 DIAGNOSIS — I10 Essential (primary) hypertension: Secondary | ICD-10-CM | POA: Diagnosis not present

## 2020-08-08 DIAGNOSIS — F339 Major depressive disorder, recurrent, unspecified: Secondary | ICD-10-CM | POA: Diagnosis not present

## 2020-08-08 DIAGNOSIS — R54 Age-related physical debility: Secondary | ICD-10-CM | POA: Diagnosis not present

## 2020-08-08 DIAGNOSIS — F0390 Unspecified dementia without behavioral disturbance: Secondary | ICD-10-CM | POA: Diagnosis not present

## 2020-08-08 DIAGNOSIS — M8580 Other specified disorders of bone density and structure, unspecified site: Secondary | ICD-10-CM | POA: Diagnosis not present

## 2020-08-08 DIAGNOSIS — E782 Mixed hyperlipidemia: Secondary | ICD-10-CM | POA: Diagnosis not present

## 2020-08-19 DIAGNOSIS — Z79899 Other long term (current) drug therapy: Secondary | ICD-10-CM | POA: Diagnosis not present

## 2020-08-26 DIAGNOSIS — Z86711 Personal history of pulmonary embolism: Secondary | ICD-10-CM | POA: Diagnosis not present

## 2020-08-26 DIAGNOSIS — F0390 Unspecified dementia without behavioral disturbance: Secondary | ICD-10-CM | POA: Diagnosis not present

## 2020-08-26 DIAGNOSIS — Z79899 Other long term (current) drug therapy: Secondary | ICD-10-CM | POA: Diagnosis not present

## 2020-08-26 DIAGNOSIS — I10 Essential (primary) hypertension: Secondary | ICD-10-CM | POA: Diagnosis not present

## 2020-08-26 DIAGNOSIS — Z7901 Long term (current) use of anticoagulants: Secondary | ICD-10-CM | POA: Diagnosis not present

## 2020-08-26 DIAGNOSIS — E782 Mixed hyperlipidemia: Secondary | ICD-10-CM | POA: Diagnosis not present

## 2020-08-26 DIAGNOSIS — E538 Deficiency of other specified B group vitamins: Secondary | ICD-10-CM | POA: Diagnosis not present

## 2020-08-26 DIAGNOSIS — M8580 Other specified disorders of bone density and structure, unspecified site: Secondary | ICD-10-CM | POA: Diagnosis not present

## 2020-08-26 DIAGNOSIS — E559 Vitamin D deficiency, unspecified: Secondary | ICD-10-CM | POA: Diagnosis not present

## 2020-09-26 DIAGNOSIS — S32030A Wedge compression fracture of third lumbar vertebra, initial encounter for closed fracture: Secondary | ICD-10-CM | POA: Diagnosis not present

## 2020-09-26 DIAGNOSIS — G8911 Acute pain due to trauma: Secondary | ICD-10-CM | POA: Diagnosis not present

## 2020-09-26 DIAGNOSIS — Z79899 Other long term (current) drug therapy: Secondary | ICD-10-CM | POA: Diagnosis not present

## 2020-09-26 DIAGNOSIS — M47816 Spondylosis without myelopathy or radiculopathy, lumbar region: Secondary | ICD-10-CM | POA: Diagnosis not present

## 2020-09-26 DIAGNOSIS — Z7982 Long term (current) use of aspirin: Secondary | ICD-10-CM | POA: Diagnosis not present

## 2020-09-26 DIAGNOSIS — F0390 Unspecified dementia without behavioral disturbance: Secondary | ICD-10-CM | POA: Diagnosis not present

## 2020-09-26 DIAGNOSIS — I10 Essential (primary) hypertension: Secondary | ICD-10-CM | POA: Diagnosis not present

## 2020-09-26 DIAGNOSIS — E7800 Pure hypercholesterolemia, unspecified: Secondary | ICD-10-CM | POA: Diagnosis not present

## 2020-09-26 DIAGNOSIS — Z7901 Long term (current) use of anticoagulants: Secondary | ICD-10-CM | POA: Diagnosis not present

## 2020-09-27 DIAGNOSIS — M47816 Spondylosis without myelopathy or radiculopathy, lumbar region: Secondary | ICD-10-CM | POA: Diagnosis not present

## 2020-09-30 DIAGNOSIS — G479 Sleep disorder, unspecified: Secondary | ICD-10-CM | POA: Diagnosis not present

## 2020-09-30 DIAGNOSIS — S32030D Wedge compression fracture of third lumbar vertebra, subsequent encounter for fracture with routine healing: Secondary | ICD-10-CM | POA: Diagnosis not present

## 2020-09-30 DIAGNOSIS — F0390 Unspecified dementia without behavioral disturbance: Secondary | ICD-10-CM | POA: Diagnosis not present

## 2020-09-30 DIAGNOSIS — K5900 Constipation, unspecified: Secondary | ICD-10-CM | POA: Diagnosis not present

## 2020-09-30 DIAGNOSIS — E559 Vitamin D deficiency, unspecified: Secondary | ICD-10-CM | POA: Diagnosis not present

## 2020-09-30 DIAGNOSIS — K625 Hemorrhage of anus and rectum: Secondary | ICD-10-CM | POA: Diagnosis not present

## 2020-09-30 DIAGNOSIS — M810 Age-related osteoporosis without current pathological fracture: Secondary | ICD-10-CM | POA: Diagnosis not present

## 2020-10-14 DIAGNOSIS — S32030D Wedge compression fracture of third lumbar vertebra, subsequent encounter for fracture with routine healing: Secondary | ICD-10-CM | POA: Diagnosis not present

## 2020-10-14 DIAGNOSIS — R197 Diarrhea, unspecified: Secondary | ICD-10-CM | POA: Diagnosis not present

## 2020-10-14 DIAGNOSIS — G479 Sleep disorder, unspecified: Secondary | ICD-10-CM | POA: Diagnosis not present

## 2020-10-14 DIAGNOSIS — R262 Difficulty in walking, not elsewhere classified: Secondary | ICD-10-CM | POA: Diagnosis not present

## 2020-10-14 DIAGNOSIS — Z7901 Long term (current) use of anticoagulants: Secondary | ICD-10-CM | POA: Diagnosis not present

## 2020-10-14 DIAGNOSIS — R5381 Other malaise: Secondary | ICD-10-CM | POA: Diagnosis not present

## 2020-10-14 DIAGNOSIS — I10 Essential (primary) hypertension: Secondary | ICD-10-CM | POA: Diagnosis not present

## 2020-10-14 DIAGNOSIS — F0390 Unspecified dementia without behavioral disturbance: Secondary | ICD-10-CM | POA: Diagnosis not present

## 2020-10-22 DIAGNOSIS — R5381 Other malaise: Secondary | ICD-10-CM | POA: Diagnosis not present

## 2020-10-22 DIAGNOSIS — R262 Difficulty in walking, not elsewhere classified: Secondary | ICD-10-CM | POA: Diagnosis not present

## 2020-10-22 DIAGNOSIS — F0390 Unspecified dementia without behavioral disturbance: Secondary | ICD-10-CM | POA: Diagnosis not present

## 2020-11-22 DIAGNOSIS — R262 Difficulty in walking, not elsewhere classified: Secondary | ICD-10-CM | POA: Diagnosis not present

## 2020-11-22 DIAGNOSIS — F0390 Unspecified dementia without behavioral disturbance: Secondary | ICD-10-CM | POA: Diagnosis not present

## 2020-11-22 DIAGNOSIS — R5381 Other malaise: Secondary | ICD-10-CM | POA: Diagnosis not present

## 2020-11-25 DIAGNOSIS — I10 Essential (primary) hypertension: Secondary | ICD-10-CM | POA: Diagnosis not present

## 2020-11-25 DIAGNOSIS — Z7901 Long term (current) use of anticoagulants: Secondary | ICD-10-CM | POA: Diagnosis not present

## 2020-11-25 DIAGNOSIS — S32030D Wedge compression fracture of third lumbar vertebra, subsequent encounter for fracture with routine healing: Secondary | ICD-10-CM | POA: Diagnosis not present

## 2020-11-25 DIAGNOSIS — F0390 Unspecified dementia without behavioral disturbance: Secondary | ICD-10-CM | POA: Diagnosis not present

## 2020-11-25 DIAGNOSIS — Z23 Encounter for immunization: Secondary | ICD-10-CM | POA: Diagnosis not present

## 2020-11-25 DIAGNOSIS — M810 Age-related osteoporosis without current pathological fracture: Secondary | ICD-10-CM | POA: Diagnosis not present

## 2021-01-14 DIAGNOSIS — E559 Vitamin D deficiency, unspecified: Secondary | ICD-10-CM | POA: Diagnosis not present

## 2021-01-14 DIAGNOSIS — S32030D Wedge compression fracture of third lumbar vertebra, subsequent encounter for fracture with routine healing: Secondary | ICD-10-CM | POA: Diagnosis not present

## 2021-01-14 DIAGNOSIS — G479 Sleep disorder, unspecified: Secondary | ICD-10-CM | POA: Diagnosis not present

## 2021-01-14 DIAGNOSIS — K625 Hemorrhage of anus and rectum: Secondary | ICD-10-CM | POA: Diagnosis not present

## 2021-01-14 DIAGNOSIS — K5900 Constipation, unspecified: Secondary | ICD-10-CM | POA: Diagnosis not present

## 2021-01-14 DIAGNOSIS — M810 Age-related osteoporosis without current pathological fracture: Secondary | ICD-10-CM | POA: Diagnosis not present

## 2021-01-14 DIAGNOSIS — F0390 Unspecified dementia without behavioral disturbance: Secondary | ICD-10-CM | POA: Diagnosis not present

## 2021-01-21 ENCOUNTER — Telehealth: Payer: Self-pay | Admitting: Family Medicine

## 2021-01-21 NOTE — Telephone Encounter (Signed)
Pt's daughter Julie Martinez called states her mother has become very combative at night, not sure what should be done. Pt's daughter requesting a call back.

## 2021-01-21 NOTE — Telephone Encounter (Addendum)
LVM returning daughter call.  Pt last seen 03/12/20. Appears AL,NP discussed starting Seroquel for agitation/difficulty sleeping but looks like they decided to hold off on this? Next follow up scheduled for 03/12/21.

## 2021-01-22 NOTE — Telephone Encounter (Signed)
Took call from phone staff and spoke with daughter. Sons GF had been put on Seroquel before and states it made her "crazy" and he did not want to give this to his mother. I did advise that each individual can respond to medication differently.   States mother in last 2-3 weeks took a bad turn. More confused. Example: as soon as sun came up yesterday, she thought husband (who has been deceased for 25 yr) should be coming home from work. She went on all day about it. I recommended she call her PCP to see about checking UA/culture to r/o urinary tract infection. Aware this can cause sudden change. She did have recent labs and those looked ok.  She would like to know if AL,NP has another option other than Seroquel to offer. Aware I will send message to NP and call back.

## 2021-01-22 NOTE — Telephone Encounter (Signed)
Amy- I just LVM again for daughter to call office back.   Did you want to offer Seroquel as an option if she calls back? If so, what dose, directions, qty, refills?

## 2021-01-22 NOTE — Telephone Encounter (Signed)
I was able to call the daughter and off the patient a apt for 01/29/221 at 3 p with check I of 2:30 pm. She accepted.

## 2021-01-23 DIAGNOSIS — F99 Mental disorder, not otherwise specified: Secondary | ICD-10-CM | POA: Diagnosis not present

## 2021-01-29 ENCOUNTER — Ambulatory Visit: Payer: Medicare HMO | Admitting: Family Medicine

## 2021-01-29 ENCOUNTER — Encounter: Payer: Self-pay | Admitting: Family Medicine

## 2021-01-29 DIAGNOSIS — F03B18 Unspecified dementia, moderate, with other behavioral disturbance: Secondary | ICD-10-CM | POA: Diagnosis not present

## 2021-01-29 MED ORDER — DONEPEZIL HCL 10 MG PO TABS: 10.0000 mg | ORAL_TABLET | Freq: Every day | ORAL | 3 refills | Status: AC

## 2021-01-29 MED ORDER — MIRTAZAPINE 7.5 MG PO TABS: 7.5000 mg | ORAL_TABLET | Freq: Every day | ORAL | 3 refills | Status: AC

## 2021-01-29 MED ORDER — MEMANTINE HCL 10 MG PO TABS: 10.0000 mg | ORAL_TABLET | Freq: Two times a day (BID) | ORAL | 3 refills | Status: AC

## 2021-01-29 NOTE — Patient Instructions (Signed)
Below is our plan:  We will continue donepezil 10mg  daily and memantine 10mg  twice daily. Start mirtazapine 7.5mg  daily at bedtime.   Please make sure you are staying well hydrated. I recommend 50-60 ounces daily. Well balanced diet and regular exercise encouraged. Consistent sleep schedule with 6-8 hours recommended.   Please continue follow up with care team as directed.   Follow up with me in 4-6 month   You may receive a survey regarding today's visit. I encourage you to leave honest feed back as I do use this information to improve patient care. Thank you for seeing me today!

## 2021-01-29 NOTE — Progress Notes (Signed)
Chief Complaint  Patient presents with   Follow-up    Rm 1, w daughter. Here for behavioral concerns. Pt is not wanting to sleep. Stays up all night  Going to bed at 3am and waking up at 5am. Getting combative and aggressive with daughter. Pt's daughter is needing something to help her sleep. Not able to sleep unless someone's in the room with her. Per daughter memory is worse. Not able to tell whose house Julie Martinez's in. MMSE:15     HISTORY OF PRESENT ILLNESS: 01/29/21 ALL:  Julie Martinez returns for follow up for dementia. Julie Martinez continues donepezil 10mg  daily and memantine 10mg  BID. Memory loss has progressed. Julie Martinez has become more agitated and restless. Julie Martinez is not sleeping. Julie Martinez is always confused. Occasional visual hallucinations. Julie Martinez was treated for UTI but not sure there has been any significant improvement. Melatonin has not helped. Julie Martinez lives with her grandson.   03/12/2020 ALL:  Julie Martinez returns for follow up for dementia. At last visit, we otped to start Seroquel for concerns of agitation and difficulty sleeping. Family decided not to start this medication. Julie Martinez has continues Aricept and Namenda. Sleep concerns have improved. Julie Martinez seems to be resting well. Julie Martinez continues to have progressive memory difficulty but seems happy, otherwise. Julie Martinez is eating normally. Julie Martinez is not very active. No falls. Julie Martinez does not drive. Julie Martinez is followed regularly by PCP.   11/08/2020 ALL:  Julie Martinez is a 88 y.o. female here today for follow up for memory loss. Julie Martinez continues Aricept 10mg  daily and Namenda 10mg  BID. Julie Martinez presents with her daughter, Julie Martinez. Julie Martinez reports that Julie Martinez is fairly stable with exception of not sleeping as well.  Daughter states that Julie Martinez seems to get more agitated around at home.  Most recently, Julie Martinez has been waking up multiple times during the night and hollering out to her.  Occasionally Julie Martinez is agitated throughout the day.  They have tried giving her melatonin which has not been very effective.  Julie Martinez continues  to perform ADLs independently.  Julie Martinez is living with her grandson, Julie Martinez.  Julie Martinez does not drive.  Julie Martinez is using a walker.  No falls.  HISTORY (copied from my note on 11/09/2018)  Julie Martinez is a 88 y.o. female here today for follow up.  Julie Martinez is present with Julie Martinez, daughter, who aids in history.  Julie Martinez lives at home with her grandson, Julie Martinez, who helps with medications. No falls or changes in gait.  Julie Martinez does not use assistive devices with walking.  Julie Martinez is able to perform ADLs independently.  Julie Martinez does not drive.  Julie Martinez is feeling well today.  Julie Martinez continues to tolerate Aricept 10 mg at night and Namenda 10 mg twice daily.   HISTORY: (copied from Dr Cathren Laine note on 01/04/2017)   12/2016: Julie Martinez has had falls. Here with her daughter who provides most information. Unclear why, Julie Martinez fell, Julie Martinez doesn't know why but Julie Martinez was bruised, Julie Martinez has fallen a few more times.  Julie Martinez falls when Julie Martinez walks her dog Julie Martinez gets pulled down but refuses not to walk the dog. Julie Martinez misses medications. Memory is worsening. Julie Martinez has been having hallucinations, seeing a man in a tree behind the house. Julie Martinez still has depression. I recommend a memory unit for patient, daughter and patient decline. I advised 24x7 care, Julie Martinez has her grandchildren 24x7.    10/2015: Today October 20 13,017 Julie Martinez is an 88 year old female with a history of memory disturbance. Julie Martinez returns today for follow-up. Julie Martinez was started  on Aricept and has been tolerating it well. Julie Martinez continues to live home alone. Julie Martinez is able to complete all ADLs independently. Julie Martinez doesoperate a motor vehicle but only drives short distances. Her daughter is with her today and reports that Julie Martinez has turned on the wrong roads when driving to familiar places. This does not happen often. Julie Martinez does have a hard time sleeping. Julie Martinez states that Julie Martinez is often thinking of her deceased son. He passed away in July 26, 2014 from [place] disease. Julie Martinez does feel that Julie Martinez suffers from depression. Her primary care recently  increase Zoloft to 3 times a day. Patient returns today for an evaluation.   HPI:  Julie Martinez is a 88 y.o. female here as a referral from Dr. Drema Dallas for memory problems. Past medical history of hypertension, anxiety and depression. Here with daughter who provides most information. Memory problems at least the last year. Has been worse since 2022-07-26 when her son passed away from [place]. Daughter noticed Julie Martinez forgot a funeral Julie Martinez went to of daughter's brother in law in November. Son was living his mother for many years since the late nighties. They noticed more memory problems since he died. Husband passed away in 1997/98. Julie Martinez has been depressed since her son died. Julie Martinez says his dog died recently (daughter says the dog died many years ago). Patient pays the bills and Julie Martinez has paid all of them. No missing bills, Julie Martinez owns the house, all the utilities are paid. Daughter reminds mother and takes her to all appointments. Daughter goes over every day. No accidents in the home. Patient is alone a lot and Julie Martinez is depressed. atient repeats herself a lot, that has been going on for the last year. Julie Martinez doesn't remember telling daughter many things. Brother with dementia.     Reviewed notes, labs and imaging from outside physicians, which showed:    Reviewed notes from [facility]. Patient has had problems with memory, the Mini-Mental Status exam done on 1024 had a score of 25 out of 30. Patient has also had a number of losses recently her son died of ALS, her dog died and recently a son-in-law die. Patient had forgotten that the son-in-law died. Patient has a history of hypertension, uncertain if Julie Martinez is actually taking. Julie Martinez was prescribed in September 90 day prescription. Julie Martinez has not been checking her blood pressures at home. Diagnosed with memory problems and essential hypertension, mixed hyperlipidemia, anxiety and depression, dizziness. Julie Martinez was seen at the emergency room for dizziness and Julie Martinez was giving meclizine, the  dizziness improved. Notes also state that Julie Martinez was having problems with depression since her son died Julie Martinez care for him for many years. Julie Martinez is on sertraline and that dose was increased recently. Julie Martinez had reported Julie Martinez wasn't sleeping well. Notes noted that Julie Martinez had a sad mood and affect.   Personally reviewed images of the brain, MRI w/o 10/2013:  1.  No acute intracranial abnormality. 2. 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   REVIEW OF SYSTEMS: Out of a complete 14 system review of symptoms, the patient complains only of the following symptoms, memory loss, and all other reviewed systems are negative.  Flex sig ALLERGIES: No Known Allergies   HOME MEDICATIONS: Outpatient Medications Prior to Visit  Medication Sig Dispense Refill   Acetaminophen (TYLENOL ARTHRITIS PAIN PO) Take by mouth.     B Complex Vitamins (VITAMIN B-COMPLEX PO) Take by mouth.  cephALEXin (KEFLEX) 500 MG capsule Take 500 mg by mouth 2 (two) times daily.     cholecalciferol (VITAMIN D) 25 MCG (1000 UNIT) tablet Take 1 tablet by mouth daily.     ELIQUIS 5 MG TABS tablet Take 5 mg by mouth 2 (two) times daily.      quinapril (ACCUPRIL) 20 MG tablet Take 20 mg by mouth at bedtime.     triamterene-hydrochlorothiazide (DYAZIDE) 37.5-25 MG capsule Take 1 capsule by mouth daily.  0   alendronate (FOSAMAX) 70 MG tablet Take 70 mg by mouth once a week. Take with a full glass of water on an empty stomach.     donepezil (ARICEPT) 10 MG tablet TAKE 1 TABLET BY MOUTH AT BEDTIME. 90 tablet 3   memantine (NAMENDA) 10 MG tablet Take 1 tablet (10 mg total) by mouth 2 (two) times daily. 180 tablet 3   No facility-administered medications prior to visit.     PAST MEDICAL HISTORY: Past Medical History:  Diagnosis Date   High cholesterol    Hypertension    Osteopenia      PAST SURGICAL HISTORY: Past Surgical History:  Procedure Laterality Date   ABDOMINAL  HYSTERECTOMY     EYE SURGERY     SHOULDER SURGERY  2017   plating for fracture     FAMILY HISTORY: Family History  Problem Relation Age of Onset   Heart failure Mother      SOCIAL HISTORY: Social History   Socioeconomic History   Marital status: Widowed    Spouse name: Not on file   Number of children: 3   Years of education: 12   Highest education level: High school graduate  Occupational History   Occupation: n/a  Tobacco Use   Smoking status: Never   Smokeless tobacco: Never  Vaping Use   Vaping Use: Never used  Substance and Sexual Activity   Alcohol use: No   Drug use: No   Sexual activity: Not on file  Other Topics Concern   Not on file  Social History Narrative   Patient lives at home alone. There is always a family member there 24/7.   Caffeine Use: 1 cup of coffee daily   Right handed   Social Determinants of Health   Financial Resource Strain: Not on file  Food Insecurity: Not on file  Transportation Needs: Not on file  Physical Activity: Not on file  Stress: Not on file  Social Connections: Not on file  Intimate Partner Violence: Not on file      PHYSICAL EXAM  Vitals:   01/29/21 1450  BP: 112/68  Pulse: (!) 103  SpO2: 94%  Weight: 120 lb 8 oz (54.7 kg)  Height: 5' (1.524 m)    Body mass index is 23.53 kg/m.   Generalized: Well developed, in no acute distress   Neurological examination  Mentation: Alert, Julie Martinez is not oriented to time, but is oriented to place, and some history taking. Follows all commands speech and language fluent Cranial nerve II-XII: Pupils were equal round reactive to light. Extraocular movements were full, visual field were full on confrontational test. Facial sensation and strength were normal. Head turning and shoulder shrug  were normal and symmetric. Motor: The motor testing reveals 5 over 5 strength of all 4 extremities. Good symmetric motor tone is noted throughout.  Gait and station: Gait is short but  stable with no assistive device    DIAGNOSTIC DATA (LABS, IMAGING, TESTING) - I reviewed patient records, labs, notes, testing  and imaging myself where available.  Lab Results  Component Value Date   WBC 6.6 05/18/2019   HGB 11.7 (L) 05/18/2019   HCT 37.3 05/18/2019   MCV 99.7 05/18/2019   PLT 238 05/18/2019      Component Value Date/Time   NA 143 05/18/2019 1158   K 3.8 05/18/2019 1158   CL 110 05/18/2019 1158   CO2 26 05/18/2019 1158   GLUCOSE 85 05/18/2019 1158   BUN 22 05/18/2019 1158   CREATININE 0.92 05/18/2019 1158   CALCIUM 8.7 (L) 05/18/2019 1158   PROT 6.1 (L) 05/18/2019 1158   ALBUMIN 3.1 (L) 05/18/2019 1158   AST 14 (L) 05/18/2019 1158   ALT 10 05/18/2019 1158   ALKPHOS 66 05/18/2019 1158   BILITOT 0.3 05/18/2019 1158   GFRNONAA 56 (L) 05/18/2019 1158   GFRAA >60 05/18/2019 1158   No results found for: CHOL, HDL, LDLCALC, LDLDIRECT, TRIG, CHOLHDL No results found for: HGBA1C Lab Results  Component Value Date   VITAMINB12 315 01/25/2015   Lab Results  Component Value Date   TSH 1.200 01/25/2015    MMSE - Mini Mental State Exam 01/29/2021 03/12/2020 11/09/2019  Orientation to time 0 1 1  Orientation to Place 4 3 3   Registration 3 3 3   Attention/ Calculation 1 2 3   Recall 0 0 0  Language- name 2 objects 2 2 2   Language- repeat 1 0 1  Language- follow 3 step command 3 3 1   Language- read & follow direction 0 1 1  Write a sentence 1 1 1   Copy design 0 0 1  Total score 15 16 17     ASSESSMENT AND PLAN  88 y.o. year old female  has a past medical history of High cholesterol, Hypertension, and Osteopenia. here with   Dementia without behavioral disturbance (Rachel) - Plan: memantine (NAMENDA) 10 MG tablet  Julie Martinez has had some worsening behavioral concerns. Julie Martinez is not sleeping well. Julie Martinez is more irritable and combative at times. We will continue Aricept and Namenda as prescribed. We will add Remeron 7.5mg  daily at bedtime to help with mood and sleep.  Side effects and proper administration reviewed with her daughter. MMSE is stable at 15 of 30, last 16/30. Healthy lifestyle habits encouraged. Dementia resources for caregiver reviewed. Julie Martinez will continue regular follow up with PCP. Julie Martinez will return to see [place] in 6 months, sooner if needed. Julie Martinez verbalizes understanding and agreement with this plan.    Debbora Presto, MSN, FNP-C 01/29/2021, 3:41 PM  [facility] [address], [place], [place] [address] [phone]

## 2021-02-24 DIAGNOSIS — Z79899 Other long term (current) drug therapy: Secondary | ICD-10-CM | POA: Diagnosis not present

## 2021-02-24 DIAGNOSIS — I10 Essential (primary) hypertension: Secondary | ICD-10-CM | POA: Diagnosis not present

## 2021-02-24 DIAGNOSIS — E782 Mixed hyperlipidemia: Secondary | ICD-10-CM | POA: Diagnosis not present

## 2021-02-24 DIAGNOSIS — E538 Deficiency of other specified B group vitamins: Secondary | ICD-10-CM | POA: Diagnosis not present

## 2021-02-28 DIAGNOSIS — Z7901 Long term (current) use of anticoagulants: Secondary | ICD-10-CM | POA: Diagnosis not present

## 2021-02-28 DIAGNOSIS — Z86718 Personal history of other venous thrombosis and embolism: Secondary | ICD-10-CM | POA: Diagnosis not present

## 2021-02-28 DIAGNOSIS — Z86711 Personal history of pulmonary embolism: Secondary | ICD-10-CM | POA: Diagnosis not present

## 2021-02-28 DIAGNOSIS — E538 Deficiency of other specified B group vitamins: Secondary | ICD-10-CM | POA: Diagnosis not present

## 2021-02-28 DIAGNOSIS — I10 Essential (primary) hypertension: Secondary | ICD-10-CM | POA: Diagnosis not present

## 2021-02-28 DIAGNOSIS — Z23 Encounter for immunization: Secondary | ICD-10-CM | POA: Diagnosis not present

## 2021-02-28 DIAGNOSIS — G479 Sleep disorder, unspecified: Secondary | ICD-10-CM | POA: Diagnosis not present

## 2021-02-28 DIAGNOSIS — F0393 Unspecified dementia, unspecified severity, with mood disturbance: Secondary | ICD-10-CM | POA: Diagnosis not present

## 2021-02-28 DIAGNOSIS — F03B3 Unspecified dementia, moderate, with mood disturbance: Secondary | ICD-10-CM | POA: Diagnosis not present

## 2021-02-28 DIAGNOSIS — E559 Vitamin D deficiency, unspecified: Secondary | ICD-10-CM | POA: Diagnosis not present

## 2021-03-12 ENCOUNTER — Ambulatory Visit: Payer: Medicare HMO | Admitting: Family Medicine

## 2021-03-21 ENCOUNTER — Other Ambulatory Visit: Payer: Self-pay

## 2021-03-21 ENCOUNTER — Emergency Department (HOSPITAL_BASED_OUTPATIENT_CLINIC_OR_DEPARTMENT_OTHER): Payer: Medicare HMO

## 2021-03-21 ENCOUNTER — Inpatient Hospital Stay (HOSPITAL_BASED_OUTPATIENT_CLINIC_OR_DEPARTMENT_OTHER)
Admission: EM | Admit: 2021-03-21 | Discharge: 2021-03-27 | DRG: 522 | Disposition: A | Discharge status: Skilled Nursing Facility | Payer: Medicare HMO | Attending: Internal Medicine | Admitting: Internal Medicine

## 2021-03-21 ENCOUNTER — Emergency Department (HOSPITAL_BASED_OUTPATIENT_CLINIC_OR_DEPARTMENT_OTHER): Payer: Medicare HMO | Admitting: Radiology

## 2021-03-21 ENCOUNTER — Encounter (HOSPITAL_BASED_OUTPATIENT_CLINIC_OR_DEPARTMENT_OTHER): Payer: Self-pay

## 2021-03-21 ENCOUNTER — Encounter (HOSPITAL_COMMUNITY): Payer: Self-pay | Admitting: Orthopedic Surgery

## 2021-03-21 DIAGNOSIS — M79605 Pain in left leg: Secondary | ICD-10-CM | POA: Diagnosis not present

## 2021-03-21 DIAGNOSIS — M84452A Pathological fracture, left femur, initial encounter for fracture: Secondary | ICD-10-CM | POA: Diagnosis not present

## 2021-03-21 DIAGNOSIS — D72829 Elevated white blood cell count, unspecified: Secondary | ICD-10-CM | POA: Diagnosis present

## 2021-03-21 DIAGNOSIS — M8580 Other specified disorders of bone density and structure, unspecified site: Secondary | ICD-10-CM | POA: Diagnosis present

## 2021-03-21 DIAGNOSIS — Z7401 Bed confinement status: Secondary | ICD-10-CM | POA: Diagnosis not present

## 2021-03-21 DIAGNOSIS — I959 Hypotension, unspecified: Secondary | ICD-10-CM | POA: Diagnosis not present

## 2021-03-21 DIAGNOSIS — Z8249 Family history of ischemic heart disease and other diseases of the circulatory system: Secondary | ICD-10-CM | POA: Diagnosis not present

## 2021-03-21 DIAGNOSIS — S72022A Displaced fracture of epiphysis (separation) (upper) of left femur, initial encounter for closed fracture: Secondary | ICD-10-CM | POA: Diagnosis not present

## 2021-03-21 DIAGNOSIS — S0990XA Unspecified injury of head, initial encounter: Secondary | ICD-10-CM | POA: Diagnosis not present

## 2021-03-21 DIAGNOSIS — E7800 Pure hypercholesterolemia, unspecified: Secondary | ICD-10-CM | POA: Diagnosis present

## 2021-03-21 DIAGNOSIS — Y92009 Unspecified place in unspecified non-institutional (private) residence as the place of occurrence of the external cause: Secondary | ICD-10-CM | POA: Diagnosis not present

## 2021-03-21 DIAGNOSIS — Z86711 Personal history of pulmonary embolism: Secondary | ICD-10-CM | POA: Diagnosis not present

## 2021-03-21 DIAGNOSIS — T148XXA Other injury of unspecified body region, initial encounter: Secondary | ICD-10-CM

## 2021-03-21 DIAGNOSIS — D631 Anemia in chronic kidney disease: Secondary | ICD-10-CM | POA: Diagnosis not present

## 2021-03-21 DIAGNOSIS — Z471 Aftercare following joint replacement surgery: Secondary | ICD-10-CM | POA: Diagnosis not present

## 2021-03-21 DIAGNOSIS — K5730 Diverticulosis of large intestine without perforation or abscess without bleeding: Secondary | ICD-10-CM | POA: Diagnosis present

## 2021-03-21 DIAGNOSIS — D62 Acute posthemorrhagic anemia: Secondary | ICD-10-CM | POA: Diagnosis not present

## 2021-03-21 DIAGNOSIS — I129 Hypertensive chronic kidney disease with stage 1 through stage 4 chronic kidney disease, or unspecified chronic kidney disease: Secondary | ICD-10-CM | POA: Diagnosis present

## 2021-03-21 DIAGNOSIS — I10 Essential (primary) hypertension: Secondary | ICD-10-CM | POA: Diagnosis not present

## 2021-03-21 DIAGNOSIS — E611 Iron deficiency: Secondary | ICD-10-CM | POA: Diagnosis present

## 2021-03-21 DIAGNOSIS — Z4789 Encounter for other orthopedic aftercare: Secondary | ICD-10-CM | POA: Diagnosis not present

## 2021-03-21 DIAGNOSIS — I2699 Other pulmonary embolism without acute cor pulmonale: Secondary | ICD-10-CM | POA: Diagnosis not present

## 2021-03-21 DIAGNOSIS — Z86718 Personal history of other venous thrombosis and embolism: Secondary | ICD-10-CM | POA: Diagnosis not present

## 2021-03-21 DIAGNOSIS — Z7901 Long term (current) use of anticoagulants: Secondary | ICD-10-CM | POA: Diagnosis not present

## 2021-03-21 DIAGNOSIS — Z20822 Contact with and (suspected) exposure to covid-19: Secondary | ICD-10-CM | POA: Diagnosis present

## 2021-03-21 DIAGNOSIS — S72002S Fracture of unspecified part of neck of left femur, sequela: Secondary | ICD-10-CM | POA: Diagnosis not present

## 2021-03-21 DIAGNOSIS — R262 Difficulty in walking, not elsewhere classified: Secondary | ICD-10-CM | POA: Diagnosis not present

## 2021-03-21 DIAGNOSIS — W1830XA Fall on same level, unspecified, initial encounter: Secondary | ICD-10-CM | POA: Diagnosis present

## 2021-03-21 DIAGNOSIS — N1831 Chronic kidney disease, stage 3a: Secondary | ICD-10-CM | POA: Diagnosis present

## 2021-03-21 DIAGNOSIS — F0390 Unspecified dementia without behavioral disturbance: Secondary | ICD-10-CM | POA: Diagnosis present

## 2021-03-21 DIAGNOSIS — S72002A Fracture of unspecified part of neck of left femur, initial encounter for closed fracture: Secondary | ICD-10-CM

## 2021-03-21 DIAGNOSIS — Z90710 Acquired absence of both cervix and uterus: Secondary | ICD-10-CM | POA: Diagnosis not present

## 2021-03-21 DIAGNOSIS — N179 Acute kidney failure, unspecified: Secondary | ICD-10-CM | POA: Diagnosis present

## 2021-03-21 DIAGNOSIS — S72012A Unspecified intracapsular fracture of left femur, initial encounter for closed fracture: Secondary | ICD-10-CM | POA: Diagnosis not present

## 2021-03-21 DIAGNOSIS — N1832 Chronic kidney disease, stage 3b: Secondary | ICD-10-CM

## 2021-03-21 DIAGNOSIS — R2681 Unsteadiness on feet: Secondary | ICD-10-CM | POA: Diagnosis not present

## 2021-03-21 DIAGNOSIS — M6259 Muscle wasting and atrophy, not elsewhere classified, multiple sites: Secondary | ICD-10-CM | POA: Diagnosis not present

## 2021-03-21 DIAGNOSIS — M47816 Spondylosis without myelopathy or radiculopathy, lumbar region: Secondary | ICD-10-CM | POA: Diagnosis present

## 2021-03-21 DIAGNOSIS — Z741 Need for assistance with personal care: Secondary | ICD-10-CM | POA: Diagnosis not present

## 2021-03-21 DIAGNOSIS — M6281 Muscle weakness (generalized): Secondary | ICD-10-CM | POA: Diagnosis not present

## 2021-03-21 DIAGNOSIS — R1312 Dysphagia, oropharyngeal phase: Secondary | ICD-10-CM | POA: Diagnosis not present

## 2021-03-21 DIAGNOSIS — Z96649 Presence of unspecified artificial hip joint: Secondary | ICD-10-CM

## 2021-03-21 DIAGNOSIS — S199XXA Unspecified injury of neck, initial encounter: Secondary | ICD-10-CM | POA: Diagnosis not present

## 2021-03-21 DIAGNOSIS — R404 Transient alteration of awareness: Secondary | ICD-10-CM | POA: Diagnosis not present

## 2021-03-21 DIAGNOSIS — N189 Chronic kidney disease, unspecified: Secondary | ICD-10-CM | POA: Diagnosis not present

## 2021-03-21 DIAGNOSIS — Z96642 Presence of left artificial hip joint: Secondary | ICD-10-CM | POA: Diagnosis not present

## 2021-03-21 HISTORY — DX: Unspecified dementia, unspecified severity, without behavioral disturbance, psychotic disturbance, mood disturbance, and anxiety: F03.90

## 2021-03-21 LAB — CBC WITH DIFFERENTIAL/PLATELET
Abs Immature Granulocytes: 0.07 10*3/uL (ref 0.00–0.07)
Basophils Absolute: 0.1 10*3/uL (ref 0.0–0.1)
Basophils Relative: 1 %
Eosinophils Absolute: 0.3 10*3/uL (ref 0.0–0.5)
Eosinophils Relative: 4 %
HCT: 36.9 % (ref 36.0–46.0)
Hemoglobin: 11.6 g/dL — ABNORMAL LOW (ref 12.0–15.0)
Immature Granulocytes: 1 %
Lymphocytes Relative: 23 %
Lymphs Abs: 2.1 10*3/uL (ref 0.7–4.0)
MCH: 32 pg (ref 26.0–34.0)
MCHC: 31.4 g/dL (ref 30.0–36.0)
MCV: 101.9 fL — ABNORMAL HIGH (ref 80.0–100.0)
Monocytes Absolute: 0.8 10*3/uL (ref 0.1–1.0)
Monocytes Relative: 9 %
Neutro Abs: 5.6 10*3/uL (ref 1.7–7.7)
Neutrophils Relative %: 62 %
Platelets: 293 10*3/uL (ref 150–400)
RBC: 3.62 MIL/uL — ABNORMAL LOW (ref 3.87–5.11)
RDW: 12.3 % (ref 11.5–15.5)
WBC: 8.9 10*3/uL (ref 4.0–10.5)
nRBC: 0 % (ref 0.0–0.2)

## 2021-03-21 LAB — COMPREHENSIVE METABOLIC PANEL
ALT: 14 U/L (ref 0–44)
AST: 18 U/L (ref 15–41)
Albumin: 3.4 g/dL — ABNORMAL LOW (ref 3.5–5.0)
Alkaline Phosphatase: 71 U/L (ref 38–126)
Anion gap: 11 (ref 5–15)
BUN: 30 mg/dL — ABNORMAL HIGH (ref 8–23)
CO2: 22 mmol/L (ref 22–32)
Calcium: 8.8 mg/dL — ABNORMAL LOW (ref 8.9–10.3)
Chloride: 110 mmol/L (ref 98–111)
Creatinine, Ser: 1.4 mg/dL — ABNORMAL HIGH (ref 0.44–1.00)
GFR, Estimated: 36 mL/min — ABNORMAL LOW (ref >60)
Glucose, Bld: 131 mg/dL — ABNORMAL HIGH (ref 70–99)
Potassium: 3.9 mmol/L (ref 3.5–5.1)
Sodium: 143 mmol/L (ref 135–145)
Total Bilirubin: 0.4 mg/dL (ref 0.3–1.2)
Total Protein: 6.4 g/dL — ABNORMAL LOW (ref 6.5–8.1)

## 2021-03-21 LAB — RESP PANEL BY RT-PCR (FLU A&B, COVID) ARPGX2
Influenza A by PCR: NEGATIVE
Influenza B by PCR: NEGATIVE
SARS Coronavirus 2 by RT PCR: NEGATIVE

## 2021-03-21 MED ORDER — LACTATED RINGERS IV BOLUS
1000.0000 mL | Freq: Once | INTRAVENOUS | Status: AC
  Administered 2021-03-21: 1000 mL via INTRAVENOUS

## 2021-03-21 MED ORDER — FENTANYL CITRATE PF 50 MCG/ML IJ SOSY
50.0000 ug | PREFILLED_SYRINGE | Freq: Once | INTRAMUSCULAR | Status: AC
  Administered 2021-03-21: 50 ug via INTRAVENOUS
  Filled 2021-03-21: qty 1

## 2021-03-21 MED ORDER — MORPHINE SULFATE (PF) 4 MG/ML IV SOLN
4.0000 mg | Freq: Once | INTRAVENOUS | Status: AC
  Administered 2021-03-21: 4 mg via INTRAVENOUS
  Filled 2021-03-21: qty 1

## 2021-03-21 MED ORDER — ACETAMINOPHEN 10 MG/ML IV SOLN
1000.0000 mg | Freq: Four times a day (QID) | INTRAVENOUS | Status: DC
Start: 2021-03-21 — End: 2021-03-21
  Filled 2021-03-21: qty 100

## 2021-03-21 MED ORDER — KETOROLAC TROMETHAMINE 30 MG/ML IJ SOLN
30.0000 mg | Freq: Once | INTRAMUSCULAR | Status: AC
  Administered 2021-03-21: 30 mg via INTRAVENOUS
  Filled 2021-03-21: qty 1

## 2021-03-21 MED ORDER — DIAZEPAM 5 MG/ML IJ SOLN
2.5000 mg | Freq: Once | INTRAMUSCULAR | Status: AC
  Administered 2021-03-21: 2.5 mg via INTRAVENOUS
  Filled 2021-03-21: qty 2

## 2021-03-21 NOTE — ED Triage Notes (Signed)
Patient got out of bed and was found by her grandson in the floor at the foot of the bed when she was heard yelling. Patient normally uses a walker. ?

## 2021-03-21 NOTE — Consult Note (Signed)
ORTHOPAEDIC CONSULTATION  REQUESTING PHYSICIAN: [place], Greggory Brandy, MD  Chief Complaint: left hip fracture  HPI: Julie Martinez is a 88 y.o. female who presents with displaced left femoral neck fracture after a fall while at home.  Per report she does walk with a walker.  She does have a history of dementia and memory loss.  She does not specifically remember the fall.  She is with her daughter Julie Martinez at the bedside today.  Daughter lives with her and takes care of her over the weekends.  She is on Eliquis which she last took the day before.  Past Medical History:  Diagnosis Date   Dementia ([place])    High cholesterol    Hypertension    Osteopenia    Past Surgical History:  Procedure Laterality Date   ABDOMINAL HYSTERECTOMY     EYE SURGERY     SHOULDER SURGERY  2017   plating for fracture   Social History   Socioeconomic History   Marital status: Widowed    Spouse name: Not on file   Number of children: 3   Years of education: 12   Highest education level: High school graduate  Occupational History   Occupation: n/a  Tobacco Use   Smoking status: Never   Smokeless tobacco: Never  Vaping Use   Vaping Use: Never used  Substance and Sexual Activity   Alcohol use: No   Drug use: No   Sexual activity: Not on file  Other Topics Concern   Not on file  Social History Narrative   Patient lives at home alone. There is always a family member there 24/7.   Caffeine Use: 1 cup of coffee daily   Right handed   Social Determinants of Health   Financial Resource Strain: Not on file  Food Insecurity: Not on file  Transportation Needs: Not on file  Physical Activity: Not on file  Stress: Not on file  Social Connections: Not on file   Family History  Problem Relation Age of Onset   Heart failure Mother    - negative except otherwise stated in the family history section No Known Allergies Prior to Admission medications   Medication Sig Start Date End Date Taking?  Authorizing Provider  Acetaminophen (TYLENOL ARTHRITIS PAIN PO) Take by mouth.    [provider]  B Complex Vitamins (VITAMIN B-COMPLEX PO) Take by mouth.    [provider]  cephALEXin (KEFLEX) 500 MG capsule Take 500 mg by mouth 2 (two) times daily. 01/23/21   [provider]  cholecalciferol (VITAMIN D) 25 MCG (1000 UNIT) tablet Take 1 tablet by mouth daily. 01/16/21   [provider]  donepezil (ARICEPT) 10 MG tablet Take 1 tablet (10 mg total) by mouth at bedtime. 01/29/21   Lomax, Amy, NP  ELIQUIS 5 MG TABS tablet Take 5 mg by mouth 2 (two) times daily.  05/02/19   [provider]  memantine (NAMENDA) 10 MG tablet Take 1 tablet (10 mg total) by mouth 2 (two) times daily. 01/29/21   Lomax, Amy, NP  mirtazapine (REMERON) 7.5 MG tablet Take 1 tablet (7.5 mg total) by mouth at bedtime. 01/29/21   Lomax, Amy, NP  quinapril (ACCUPRIL) 20 MG tablet Take 20 mg by mouth at bedtime.    [provider]  triamterene-hydrochlorothiazide (DYAZIDE) 37.5-25 MG capsule Take 1 capsule by mouth daily. 10/05/14   [provider]   DG Pelvis 1-2 Views  Result Date: 03/21/2021 CLINICAL DATA:  Fall, left hip  pain EXAM: PELVIS - 1-2 VIEW COMPARISON:  None. FINDINGS: There is a pathologic fracture involving the subcapital left femoral neck with a lytic lesions seen within the superior aspect of the femoral neck and erosive changes noted along the fracture margin of the proximal femur. Marked varus angulation. Mild bilateral degenerative hip arthritis. No additional fracture or dislocation identified. No additional lytic or blastic bone lesion. IMPRESSION: Pathologic subcapital left femoral neck fracture with marked varus angulation. Electronically Signed   By: Fidela Salisbury M.D.   On: 03/21/2021 04:25   DG Knee 2 Views Left  Result Date: 03/21/2021 CLINICAL DATA:  Left leg pain EXAM: LEFT KNEE - 1-2 VIEW COMPARISON:  None. FINDINGS: Single view radiograph left  knee demonstrates normal alignment on this limited examination. No definite fracture or dislocation. Joint spaces are not well profiled. IMPRESSION: Limited but unremarkable examination. Electronically Signed   By: Fidela Salisbury M.D.   On: 03/21/2021 04:26   CT Head Wo Contrast  Result Date: 03/21/2021 CLINICAL DATA:  Fall, head and neck trauma EXAM: CT HEAD WITHOUT CONTRAST CT CERVICAL SPINE WITHOUT CONTRAST TECHNIQUE: Multidetector CT imaging of the head and cervical spine was performed following the standard protocol without intravenous contrast. Multiplanar CT image reconstructions of the cervical spine were also generated. RADIATION DOSE REDUCTION: This exam was performed according to the departmental dose-optimization program which includes automated exposure control, adjustment of the mA and/or kV according to patient size and/or use of iterative reconstruction technique. COMPARISON:  None. FINDINGS: CT HEAD FINDINGS Brain: Normal anatomic configuration. Parenchymal volume loss is commensurate with the patient's age. Mild periventricular white matter changes are present likely reflecting the sequela of small vessel ischemia. No abnormal intra or extra-axial mass lesion or fluid collection. No abnormal mass effect or midline shift. No evidence of acute intracranial hemorrhage or infarct. Ventricular size is normal. Cerebellum unremarkable. Vascular: No asymmetric hyperdense vasculature at the skull base. Skull: Intact Sinuses/Orbits: Paranasal sinuses are clear. Ocular lenses have been removed. Orbits are otherwise unremarkable. Other: Mastoid air cells and middle ear cavities are clear. CT CERVICAL SPINE FINDINGS Alignment: There is extension weighted cervical lordosis, likely positional in nature. 2 mm anterolisthesis C4-5. Skull base and vertebrae: Craniocervical alignment is normal. The atlantodental interval is not widened. There is a mild remote appearing compression deformity of C3 with minimal loss  of height and minimal buckling of the posterior cortex without frank retropulsion. A remote appearing inferior endplate fracture of C6 is noted. No acute fracture of the cervical spine. Soft tissues and spinal canal: No prevertebral fluid or swelling. No visible canal hematoma. Disc levels: There is intervertebral disc space narrowing and endplate remodeling of Z8-H8, most severe at C5-6 and C6-7 in keeping with changes of moderate to severe degenerative disc disease. Prevertebral soft tissues are not thickened on sagittal reformats. Spinal canal is widely patent. Review of the axial images demonstrates multilevel uncovertebral and facet arthrosis resulting in mild neuroforaminal narrowing on the right at C4-5. Upper chest: Biapical scarring noted. Other: None IMPRESSION: No acute intracranial injury.  No calvarial fracture. No acute fracture or listhesis of the cervical spine. Electronically Signed   By: Fidela Salisbury M.D.   On: 03/21/2021 04:23   CT Cervical Spine Wo Contrast  Result Date: 03/21/2021 CLINICAL DATA:  Fall, head and neck trauma EXAM: CT HEAD WITHOUT CONTRAST CT CERVICAL SPINE WITHOUT CONTRAST TECHNIQUE: Multidetector CT imaging of the head and cervical spine was performed following the standard protocol without intravenous contrast. Multiplanar CT image  reconstructions of the cervical spine were also generated. RADIATION DOSE REDUCTION: This exam was performed according to the departmental dose-optimization program which includes automated exposure control, adjustment of the mA and/or kV according to patient size and/or use of iterative reconstruction technique. COMPARISON:  None. FINDINGS: CT HEAD FINDINGS Brain: Normal anatomic configuration. Parenchymal volume loss is commensurate with the patient's age. Mild periventricular white matter changes are present likely reflecting the sequela of small vessel ischemia. No abnormal intra or extra-axial mass lesion or fluid collection. No abnormal  mass effect or midline shift. No evidence of acute intracranial hemorrhage or infarct. Ventricular size is normal. Cerebellum unremarkable. Vascular: No asymmetric hyperdense vasculature at the skull base. Skull: Intact Sinuses/Orbits: Paranasal sinuses are clear. Ocular lenses have been removed. Orbits are otherwise unremarkable. Other: Mastoid air cells and middle ear cavities are clear. CT CERVICAL SPINE FINDINGS Alignment: There is extension weighted cervical lordosis, likely positional in nature. 2 mm anterolisthesis C4-5. Skull base and vertebrae: Craniocervical alignment is normal. The atlantodental interval is not widened. There is a mild remote appearing compression deformity of C3 with minimal loss of height and minimal buckling of the posterior cortex without frank retropulsion. A remote appearing inferior endplate fracture of C6 is noted. No acute fracture of the cervical spine. Soft tissues and spinal canal: No prevertebral fluid or swelling. No visible canal hematoma. Disc levels: There is intervertebral disc space narrowing and endplate remodeling of I7-P8, most severe at C5-6 and C6-7 in keeping with changes of moderate to severe degenerative disc disease. Prevertebral soft tissues are not thickened on sagittal reformats. Spinal canal is widely patent. Review of the axial images demonstrates multilevel uncovertebral and facet arthrosis resulting in mild neuroforaminal narrowing on the right at C4-5. Upper chest: Biapical scarring noted. Other: None IMPRESSION: No acute intracranial injury.  No calvarial fracture. No acute fracture or listhesis of the cervical spine. Electronically Signed   By: Fidela Salisbury M.D.   On: 03/21/2021 04:23   CT PELVIS WO CONTRAST  Result Date: 03/21/2021 CLINICAL DATA:  88 year old female status post fall with hip pain. Proximal left femur fracture. EXAM: CT PELVIS WITHOUT CONTRAST TECHNIQUE: Multidetector CT imaging of the pelvis was performed following the standard  protocol without intravenous contrast. RADIATION DOSE REDUCTION: This exam was performed according to the departmental dose-optimization program which includes automated exposure control, adjustment of the mA and/or kV according to patient size and/or use of iterative reconstruction technique. COMPARISON:  Lumbar MRI 06/17/2016. FINDINGS: Urinary Tract: Kidneys not included. No hydroureter. Unremarkable bladder. Incidental pelvic phleboliths. Bowel: No dilated large or small bowel. Diverticulosis throughout the sigmoid and visible descending colon. No bowel inflammation identified. No pneumoperitoneum identified. However, there is a small right inguinal hernia containing a small bowel loop and adjacent mesentery (series 4, image 325). The hernia is approximately 4 cm in length, and does not appear incarcerated. Vascular/Lymphatic: Vascular patency is not evaluated in the absence of IV contrast. Calcified atherosclerosis at the aortoiliac bifurcation. No inguinal or pelvic lymphadenopathy identified. Reproductive: Surgically absent uterus. Normal ovaries along the pelvic sidewalls. Other:  No pelvic free fluid. Musculoskeletal: Moderate to severe L4 compression fracture corresponding to that seen in 2018 with superior endplate deformity and retropulsion of the posterosuperior endplate contributing to severe L3-L4 spinal stenosis which is partially visible. L5 vertebra, sacrum and SI joints appear intact with relatively typical osteopenia for age. There are chronic bilateral pubic rami fractures. Iliac wing and pelvic bone mineralization also appears age-appropriate. Proximal right femur is intact with  no discrete bone lesion. Subcapital left femoral neck fracture with varus impaction and posterior rotation of the femoral head is noted. There is a conspicuous roughly 18 mm radiolucent lesion along the superolateral femoral head fragment as seen radiographically (series 6, image 79). But the other proximal left femur  bone mineralization is normal for age. No significant hematoma about the hip fracture. Superficial body wall and pelvic, proximal lower extremity muscular Cher in general appears normal. IMPRESSION: 1. Acute, impacted subcapital left femoral neck fracture with an 18 mm lucent bone lesion along the fracture margin as seen radiographically. This does suggest a pathologic fracture, but no other primary bone lesions are identified about the pelvis. Age related osteopenia is noted. 2. No other acute osseous abnormality identified. Chronic bilateral pubic rami fractures. Chronic L4 compression fracture with retropulsion and severe L3-L4 spinal stenosis stable since 2018. 3. Small right inguinal hernia containing a small bowel loop and mesentery does not appear incarcerated. Diverticulosis of the distal colon. Aortic Atherosclerosis (ICD10-I70.0). Electronically Signed   By: Genevie Ann M.D.   On: 03/21/2021 04:55   DG Femur Min 2 Views Left  Result Date: 03/21/2021 CLINICAL DATA:  Left hip fracture EXAM: LEFT FEMUR 2 VIEWS COMPARISON:  None. FINDINGS: There is a pathologic subcapital femoral neck fracture of the left hip with a lytic lesion involving the superior aspect of the subcapital femoral neck. Override and marked varus angulation of the femoral shaft. Femoral head is still seated within the left acetabulum with superimposed mild-to-moderate degenerative hip arthritis. No additional fracture or dislocation. No additional lytic or blastic bone lesion. IMPRESSION: Pathologic left subcapital femoral neck fracture with override and marked varus angulation. Contrast enhanced MRI examination and bone scintigraphy may be helpful for further evaluation. Electronically Signed   By: Fidela Salisbury M.D.   On: 03/21/2021 04:29     Positive ROS: All other systems have been reviewed and were otherwise negative with the exception of those mentioned in the HPI and as above.  Physical Exam: General: No acute  distress Cardiovascular: No pedal edema Respiratory: No cyanosis, no use of accessory musculature GI: No organomegaly, abdomen is soft and non-tender Skin: No lesions in the area of chief complaint Neurologic: Sensation intact distally Psychiatric: Patient is at baseline mood and affect Lymphatic: No axillary or cervical lymphadenopathy  MUSCULOSKELETAL:  Left hip is shortened externally rotated.  She able to flex and extend all of the toes of the left foot.  Sensation intact all distributions of the left foot  Independent Imaging Review: X-rays AP pelvis and 2 views left hip: Displaced left femoral neck fracture  Assessment: 88 year old female with displaced left femoral neck fracture after fall from standing.  At this time I talked with her and her daughter Julie Martinez regarding the risks and benefits of surgery versus nonsurgical treatments.  I described that ultimately hemiarthroplasty to allow her to ambulate sooner and place weight on the leg.  This would also allow for improved hygiene and mobilization.  She understands this and would like to proceed with left hip hemiarthroplasty  Plan: Plan for left hip hemiarthroplasty with Dr. Marlou Sa on 3/5 Appreciate transfer to [facility] with medical admission with preoperative optimization and clearance   After a lengthy discussion of treatment options, including risks, benefits, alternatives, complications of surgical and nonsurgical conservative options, the patient elected surgical repair.   The patient  is aware of the material risks  and complications including, but not limited to injury to adjacent structures, neurovascular injury, infection, numbness,  bleeding, implant failure, thermal burns, stiffness, persistent pain, failure to heal, disease transmission from allograft, need for further surgery, dislocation, anesthetic risks, blood clots, risks of death,and others. The probabilities of surgical success and failure discussed with patient given  their particular co-morbidities.The time and nature of expected rehabilitation and recovery was discussed.The patient's questions were all answered preoperatively.  No barriers to understanding were noted. I explained the natural history of the disease process and Rx rationale.  I explained to the patient what I considered to be reasonable expectations given their personal situation.  The final treatment plan was arrived at through a shared patient decision making process model.   Thank you for the consult and the opportunity to see Ms. Lonia Chimera, MD [facility] 8:17 AM

## 2021-03-21 NOTE — ED Notes (Signed)
Pt calm and follws commands well. However, pt just took pulse ox and high risk band off. New Pulse ox and yellow arm band places on Pt. Pt stated that she was fine and indicated that she is pain free. ?

## 2021-03-21 NOTE — ED Notes (Signed)
Pt unable to sign for transfer, has dementia.  Pts daughter is aware and consents for pt to be transferred to [facility].  Daughter left prior to getting signature for transport. ?

## 2021-03-21 NOTE — ED Notes (Signed)
RT placed pt on ETCO2 [place] per MD r/t pain medication administration to monitor pt. Pt tolerating well at this time. Pt respiratory status stable w/no distress noted at this time. RT will continue to monitor.  ?

## 2021-03-21 NOTE — ED Provider Notes (Signed)
[facility] EMERGENCY DEPT Provider Note   CSN: [id number] Arrival date & time: 03/21/21  4166     History  Chief Complaint  Patient presents with   Lytle Michaels    Julie Martinez is a 88 y.o. female.   Fall This is a new problem. The current episode started 1 to 2 hours ago. The problem occurs constantly. The problem has not changed since onset.Pertinent negatives include no chest pain, no abdominal pain, no headaches and no shortness of breath. The symptoms are aggravated by standing.      Home Medications Prior to Admission medications   Medication Sig Start Date End Date Taking? Authorizing Provider  Acetaminophen (TYLENOL ARTHRITIS PAIN PO) Take by mouth.    [provider]  B Complex Vitamins (VITAMIN B-COMPLEX PO) Take by mouth.    [provider]  cephALEXin (KEFLEX) 500 MG capsule Take 500 mg by mouth 2 (two) times daily. 01/23/21   [provider]  cholecalciferol (VITAMIN D) 25 MCG (1000 UNIT) tablet Take 1 tablet by mouth daily. 01/16/21   [provider]  donepezil (ARICEPT) 10 MG tablet Take 1 tablet (10 mg total) by mouth at bedtime. 01/29/21   Lomax, Amy, NP  ELIQUIS 5 MG TABS tablet Take 5 mg by mouth 2 (two) times daily.  05/02/19   [provider]  memantine (NAMENDA) 10 MG tablet Take 1 tablet (10 mg total) by mouth 2 (two) times daily. 01/29/21   Lomax, Amy, NP  mirtazapine (REMERON) 7.5 MG tablet Take 1 tablet (7.5 mg total) by mouth at bedtime. 01/29/21   Lomax, Amy, NP  quinapril (ACCUPRIL) 20 MG tablet Take 20 mg by mouth at bedtime.    [provider]  triamterene-hydrochlorothiazide (DYAZIDE) 37.5-25 MG capsule Take 1 capsule by mouth daily. 10/05/14   [provider]      Allergies    Patient has no known allergies.    Review of Systems   Review of Systems  Respiratory:  Negative for shortness of breath.   Cardiovascular:  Negative for chest pain.  Gastrointestinal:  Negative for  abdominal pain.  Neurological:  Negative for headaches.   Physical Exam Updated Vital Signs BP (!) 143/54    Pulse 75    Temp 97.9 F (36.6 C) (Oral)    Resp 20    Wt 54.4 kg    SpO2 98%    BMI 23.44 kg/m  Physical Exam Vitals and nursing note reviewed.  Constitutional:      Appearance: She is well-developed.  HENT:     Head: Normocephalic and atraumatic.  Eyes:     Pupils: Pupils are equal, round, and reactive to light.  Cardiovascular:     Rate and Rhythm: Normal rate and regular rhythm.  Pulmonary:     Effort: No respiratory distress.     Breath sounds: No stridor.  Abdominal:     General: There is no distension.  Musculoskeletal:        General: Tenderness (and pain to left hip with any movement) present. No swelling. Normal range of motion.     Cervical back: Normal range of motion.  Skin:    General: Skin is warm and dry.     Comments: Small skin tear to left elbow Multiple bruises to all extremities  Neurological:     General: No focal deficit present.     Mental Status: She is alert.    ED Results / Procedures / Treatments   Labs (all labs ordered  are listed, but only abnormal results are displayed) Labs Reviewed  CBC WITH DIFFERENTIAL/PLATELET - Abnormal; Notable for the following components:      Result Value   RBC 3.62 (*)    Hemoglobin 11.6 (*)    MCV 101.9 (*)    All other components within normal limits  COMPREHENSIVE METABOLIC PANEL - Abnormal; Notable for the following components:   Glucose, Bld 131 (*)    BUN 30 (*)    Creatinine, Ser 1.40 (*)    Calcium 8.8 (*)    Total Protein 6.4 (*)    Albumin 3.4 (*)    GFR, Estimated 36 (*)    All other components within normal limits  RESP PANEL BY RT-PCR (FLU A&B, COVID) ARPGX2  URINALYSIS, ROUTINE W REFLEX MICROSCOPIC    EKG None  Radiology DG Pelvis 1-2 Views  Result Date: 03/21/2021 CLINICAL DATA:  Fall, left hip pain EXAM: PELVIS - 1-2 VIEW COMPARISON:  None. FINDINGS: There is a pathologic  fracture involving the subcapital left femoral neck with a lytic lesions seen within the superior aspect of the femoral neck and erosive changes noted along the fracture margin of the proximal femur. Marked varus angulation. Mild bilateral degenerative hip arthritis. No additional fracture or dislocation identified. No additional lytic or blastic bone lesion. IMPRESSION: Pathologic subcapital left femoral neck fracture with marked varus angulation. Electronically Signed   By: Fidela Salisbury M.D.   On: 03/21/2021 04:25   DG Knee 2 Views Left  Result Date: 03/21/2021 CLINICAL DATA:  Left leg pain EXAM: LEFT KNEE - 1-2 VIEW COMPARISON:  None. FINDINGS: Single view radiograph left knee demonstrates normal alignment on this limited examination. No definite fracture or dislocation. Joint spaces are not well profiled. IMPRESSION: Limited but unremarkable examination. Electronically Signed   By: Fidela Salisbury M.D.   On: 03/21/2021 04:26   CT Head Wo Contrast  Result Date: 03/21/2021 CLINICAL DATA:  Fall, head and neck trauma EXAM: CT HEAD WITHOUT CONTRAST CT CERVICAL SPINE WITHOUT CONTRAST TECHNIQUE: Multidetector CT imaging of the head and cervical spine was performed following the standard protocol without intravenous contrast. Multiplanar CT image reconstructions of the cervical spine were also generated. RADIATION DOSE REDUCTION: This exam was performed according to the departmental dose-optimization program which includes automated exposure control, adjustment of the mA and/or kV according to patient size and/or use of iterative reconstruction technique. COMPARISON:  None. FINDINGS: CT HEAD FINDINGS Brain: Normal anatomic configuration. Parenchymal volume loss is commensurate with the patient's age. Mild periventricular white matter changes are present likely reflecting the sequela of small vessel ischemia. No abnormal intra or extra-axial mass lesion or fluid collection. No abnormal mass effect or midline  shift. No evidence of acute intracranial hemorrhage or infarct. Ventricular size is normal. Cerebellum unremarkable. Vascular: No asymmetric hyperdense vasculature at the skull base. Skull: Intact Sinuses/Orbits: Paranasal sinuses are clear. Ocular lenses have been removed. Orbits are otherwise unremarkable. Other: Mastoid air cells and middle ear cavities are clear. CT CERVICAL SPINE FINDINGS Alignment: There is extension weighted cervical lordosis, likely positional in nature. 2 mm anterolisthesis C4-5. Skull base and vertebrae: Craniocervical alignment is normal. The atlantodental interval is not widened. There is a mild remote appearing compression deformity of C3 with minimal loss of height and minimal buckling of the posterior cortex without frank retropulsion. A remote appearing inferior endplate fracture of C6 is noted. No acute fracture of the cervical spine. Soft tissues and spinal canal: No prevertebral fluid or swelling. No visible canal hematoma.  Disc levels: There is intervertebral disc space narrowing and endplate remodeling of S5-K5, most severe at C5-6 and C6-7 in keeping with changes of moderate to severe degenerative disc disease. Prevertebral soft tissues are not thickened on sagittal reformats. Spinal canal is widely patent. Review of the axial images demonstrates multilevel uncovertebral and facet arthrosis resulting in mild neuroforaminal narrowing on the right at C4-5. Upper chest: Biapical scarring noted. Other: None IMPRESSION: No acute intracranial injury.  No calvarial fracture. No acute fracture or listhesis of the cervical spine. Electronically Signed   By: Fidela Salisbury M.D.   On: 03/21/2021 04:23   CT Cervical Spine Wo Contrast  Result Date: 03/21/2021 CLINICAL DATA:  Fall, head and neck trauma EXAM: CT HEAD WITHOUT CONTRAST CT CERVICAL SPINE WITHOUT CONTRAST TECHNIQUE: Multidetector CT imaging of the head and cervical spine was performed following the standard protocol without  intravenous contrast. Multiplanar CT image reconstructions of the cervical spine were also generated. RADIATION DOSE REDUCTION: This exam was performed according to the departmental dose-optimization program which includes automated exposure control, adjustment of the mA and/or kV according to patient size and/or use of iterative reconstruction technique. COMPARISON:  None. FINDINGS: CT HEAD FINDINGS Brain: Normal anatomic configuration. Parenchymal volume loss is commensurate with the patient's age. Mild periventricular white matter changes are present likely reflecting the sequela of small vessel ischemia. No abnormal intra or extra-axial mass lesion or fluid collection. No abnormal mass effect or midline shift. No evidence of acute intracranial hemorrhage or infarct. Ventricular size is normal. Cerebellum unremarkable. Vascular: No asymmetric hyperdense vasculature at the skull base. Skull: Intact Sinuses/Orbits: Paranasal sinuses are clear. Ocular lenses have been removed. Orbits are otherwise unremarkable. Other: Mastoid air cells and middle ear cavities are clear. CT CERVICAL SPINE FINDINGS Alignment: There is extension weighted cervical lordosis, likely positional in nature. 2 mm anterolisthesis C4-5. Skull base and vertebrae: Craniocervical alignment is normal. The atlantodental interval is not widened. There is a mild remote appearing compression deformity of C3 with minimal loss of height and minimal buckling of the posterior cortex without frank retropulsion. A remote appearing inferior endplate fracture of C6 is noted. No acute fracture of the cervical spine. Soft tissues and spinal canal: No prevertebral fluid or swelling. No visible canal hematoma. Disc levels: There is intervertebral disc space narrowing and endplate remodeling of L9-J6, most severe at C5-6 and C6-7 in keeping with changes of moderate to severe degenerative disc disease. Prevertebral soft tissues are not thickened on sagittal  reformats. Spinal canal is widely patent. Review of the axial images demonstrates multilevel uncovertebral and facet arthrosis resulting in mild neuroforaminal narrowing on the right at C4-5. Upper chest: Biapical scarring noted. Other: None IMPRESSION: No acute intracranial injury.  No calvarial fracture. No acute fracture or listhesis of the cervical spine. Electronically Signed   By: Fidela Salisbury M.D.   On: 03/21/2021 04:23   CT PELVIS WO CONTRAST  Result Date: 03/21/2021 CLINICAL DATA:  88 year old female status post fall with hip pain. Proximal left femur fracture. EXAM: CT PELVIS WITHOUT CONTRAST TECHNIQUE: Multidetector CT imaging of the pelvis was performed following the standard protocol without intravenous contrast. RADIATION DOSE REDUCTION: This exam was performed according to the departmental dose-optimization program which includes automated exposure control, adjustment of the mA and/or kV according to patient size and/or use of iterative reconstruction technique. COMPARISON:  Lumbar MRI 06/17/2016. FINDINGS: Urinary Tract: Kidneys not included. No hydroureter. Unremarkable bladder. Incidental pelvic phleboliths. Bowel: No dilated large or small bowel. Diverticulosis throughout  the sigmoid and visible descending colon. No bowel inflammation identified. No pneumoperitoneum identified. However, there is a small right inguinal hernia containing a small bowel loop and adjacent mesentery (series 4, image 325). The hernia is approximately 4 cm in length, and does not appear incarcerated. Vascular/Lymphatic: Vascular patency is not evaluated in the absence of IV contrast. Calcified atherosclerosis at the aortoiliac bifurcation. No inguinal or pelvic lymphadenopathy identified. Reproductive: Surgically absent uterus. Normal ovaries along the pelvic sidewalls. Other:  No pelvic free fluid. Musculoskeletal: Moderate to severe L4 compression fracture corresponding to that seen in 2018 with superior endplate  deformity and retropulsion of the posterosuperior endplate contributing to severe L3-L4 spinal stenosis which is partially visible. L5 vertebra, sacrum and SI joints appear intact with relatively typical osteopenia for age. There are chronic bilateral pubic rami fractures. Iliac wing and pelvic bone mineralization also appears age-appropriate. Proximal right femur is intact with no discrete bone lesion. Subcapital left femoral neck fracture with varus impaction and posterior rotation of the femoral head is noted. There is a conspicuous roughly 18 mm radiolucent lesion along the superolateral femoral head fragment as seen radiographically (series 6, image 79). But the other proximal left femur bone mineralization is normal for age. No significant hematoma about the hip fracture. Superficial body wall and pelvic, proximal lower extremity muscular Cher in general appears normal. IMPRESSION: 1. Acute, impacted subcapital left femoral neck fracture with an 18 mm lucent bone lesion along the fracture margin as seen radiographically. This does suggest a pathologic fracture, but no other primary bone lesions are identified about the pelvis. Age related osteopenia is noted. 2. No other acute osseous abnormality identified. Chronic bilateral pubic rami fractures. Chronic L4 compression fracture with retropulsion and severe L3-L4 spinal stenosis stable since 2018. 3. Small right inguinal hernia containing a small bowel loop and mesentery does not appear incarcerated. Diverticulosis of the distal colon. Aortic Atherosclerosis (ICD10-I70.0). Electronically Signed   By: Genevie Ann M.D.   On: 03/21/2021 04:55   DG Femur Min 2 Views Left  Result Date: 03/21/2021 CLINICAL DATA:  Left hip fracture EXAM: LEFT FEMUR 2 VIEWS COMPARISON:  None. FINDINGS: There is a pathologic subcapital femoral neck fracture of the left hip with a lytic lesion involving the superior aspect of the subcapital femoral neck. Override and marked varus  angulation of the femoral shaft. Femoral head is still seated within the left acetabulum with superimposed mild-to-moderate degenerative hip arthritis. No additional fracture or dislocation. No additional lytic or blastic bone lesion. IMPRESSION: Pathologic left subcapital femoral neck fracture with override and marked varus angulation. Contrast enhanced MRI examination and bone scintigraphy may be helpful for further evaluation. Electronically Signed   By: Fidela Salisbury M.D.   On: 03/21/2021 04:29    Procedures Procedures    Medications Ordered in ED Medications  acetaminophen (OFIRMEV) IV 1,000 mg (has no administration in time range)  fentaNYL (SUBLIMAZE) injection 50 mcg (50 mcg Intravenous Given 03/21/21 0329)  diazepam (VALIUM) injection 2.5 mg (2.5 mg Intravenous Given 03/21/21 0418)  fentaNYL (SUBLIMAZE) injection 50 mcg (50 mcg Intravenous Given 03/21/21 0419)  lactated ringers bolus 1,000 mL (1,000 mLs Intravenous New Bag/Given 03/21/21 0453)  ketorolac (TORADOL) 30 MG/ML injection 30 mg (30 mg Intravenous Given 03/21/21 0529)  morphine (PF) 4 MG/ML injection 4 mg (4 mg Intravenous Given 03/21/21 0529)    ED Course/ Medical Decision Making/ A&P  Medical Decision Making Amount and/or Complexity of Data Reviewed Labs: ordered. Radiology: ordered.  Risk Prescription drug management. Decision regarding hospitalization.  Unwitnessed fall. Dementia.  Likely left hip/pelvic fracture, on eliquis.  Xr viewed by myself and appears to have a subcapital hip fracture.  I consulted Dr. Sammuel Hines who requested admission to [facility] for orthopedic consultation. Requested letting the team know on arrival so they could evaluate. Did not think surgery would be today 2/2 blood thinners.   Final Clinical Impression(s) / ED Diagnoses Final diagnoses:  Closed fracture of left hip, initial encounter [facility])    Rx / [place] Orders ED Discharge Orders     None         Khloei Spiker,  Corene Cornea, MD 03/21/21 [phone]
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
## 2021-03-21 NOTE — ED Notes (Signed)
Pt's original IV came out due to Pt pulling arms up and flexing over and over with movement ?
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
## 2021-03-21 NOTE — Progress Notes (Signed)
  [facility] will assume care on arrival to accepting facility. Until arrival, care as per EDP. However, [facility] available 24/7 for questions and assistance.   Nursing staff please page [facility] Admits and Consults ([phone]) as soon as the patient arrives to the hospital.  Nehal Witting, DO [facility]  

## 2021-03-21 NOTE — ED Notes (Signed)
Called [facility] to transport patient to [facility] 5N room 5 ?

## 2021-03-21 NOTE — H&P (Addendum)
History and Physical  Julie Martinez JME:[id number] DOB: 1931/06/20 DOA: 03/21/2021  Referring physician: Direct admit from [facility]-Accepted by Dr. Bridgett Larsson, [facility].  PCP: Cari Caraway, MD  Outpatient Specialists: None Patient coming from: Home through [place] ED  Chief Complaint: Fall  HPI: Julie Martinez is a 88 y.o. female with medical history significant for advanced dementia, DVT/PE on Eliquis, essential hypertension, who presented from home through [facility] ED as a direct transfer to [facility] MedSurg unit, after a fall.  Patient lives at home.  During the week her grandson stays with her and on the weekend her daughter stays with her.  History is obtained from her daughter who is also her medical POA via phone.  Per her daughter around 2 AM she got out of bed took a few steps then fell.  No loss of consciousness.  She was in her usual state of health prior to this.  Patient was brought to [facility] ED for further evaluation.  Left hip x-ray shows subcapital hip fracture.  Orthopedic surgery, Dr. Sammuel Hines was consulted by EDP and recommended transfer to [facility] for possible orthopedic surgical repair.  Patient was on Eliquis, held in the ED to allow for washout with tentative surgical date 03/23/2021.  TRH, hospitalist service, was asked to admit.  ED Course: Temperature 98.7.  BP 170/71, pulse 80, respiratory rate 19, O2 saturation 100% on 2 L.  Lab studies remarkable for creatinine 1.43 with GFR of 36 with baseline creatinine 0.9 with GFR 56.  BUN 30.  Hemoglobin 11.6 from baseline 11.90 2021.  MCV 101.  UA is pending.  Left pelvic x-ray showing pathologic subcapital left femoral neck fracture with mild varus angulation.  Review of Systems: Review of systems as noted in the HPI. All other systems reviewed and are negative.   Past Medical History:  Diagnosis Date   Anxiety    Chronic kidney disease    stage 3   Dementia (HCC)    Dyspnea    with exertion, no oxygen   High cholesterol     Hypertension    Osteopenia    Walker as ambulation aid    Past Surgical History:  Procedure Laterality Date   ABDOMINAL HYSTERECTOMY     COLONOSCOPY     EYE SURGERY Bilateral    eyelids   SHOULDER SURGERY  2017   plating for fracture left    Social History:  reports that she has never smoked. She has never used smokeless tobacco. She reports that she does not drink alcohol and does not use drugs.   No Known Allergies  Family History  Problem Relation Age of Onset   Heart failure Mother       Prior to Admission medications   Medication Sig Start Date End Date Taking? Authorizing Provider  Acetaminophen (TYLENOL ARTHRITIS PAIN PO) Take by mouth.    [provider]  B Complex Vitamins (VITAMIN B-COMPLEX PO) Take by mouth.    [provider]  cephALEXin (KEFLEX) 500 MG capsule Take 500 mg by mouth 2 (two) times daily. 01/23/21   [provider]  cholecalciferol (VITAMIN D) 25 MCG (1000 UNIT) tablet Take 1 tablet by mouth daily. 01/16/21   [provider]  donepezil (ARICEPT) 10 MG tablet Take 1 tablet (10 mg total) by mouth at bedtime. 01/29/21   Lomax, Amy, NP  ELIQUIS 5 MG TABS tablet Take 5 mg by mouth 2 (two) times daily.  05/02/19   [provider]  memantine (NAMENDA) 10 MG tablet  Take 1 tablet (10 mg total) by mouth 2 (two) times daily. 01/29/21   Lomax, Amy, NP  mirtazapine (REMERON) 7.5 MG tablet Take 1 tablet (7.5 mg total) by mouth at bedtime. 01/29/21   Lomax, Amy, NP  quinapril (ACCUPRIL) 20 MG tablet Take 20 mg by mouth at bedtime.    [provider]  triamterene-hydrochlorothiazide (DYAZIDE) 37.5-25 MG capsule Take 1 capsule by mouth daily. 10/05/14   [provider]    Physical Exam: BP (!) 179/79    Pulse 86    Temp 98.7 F (37.1 C) (Oral)    Resp 19    Wt 59 kg    SpO2 94%    BMI 25.40 kg/m   General: 88 y.o. year-old female well developed well nourished in no acute distress.  Alert and pleasantly  confused. Cardiovascular: Regular rate and rhythm with no rubs or gallops.  No thyromegaly or JVD noted.  No lower extremity edema bilaterally. Respiratory: Clear to auscultation with no wheezes or rales. Good inspiratory effort. Abdomen: Soft nontender nondistended with normal bowel sounds x4 quadrants. Muskuloskeletal: No cyanosis, clubbing or edema noted bilaterally Neuro: CN II-XII intact, strength, sensation, reflexes Skin: No ulcerative lesions noted or rashes Psychiatry: Judgement and insight appear altered. Mood is appropriate for condition and setting          Labs on Admission:  Basic Metabolic Panel: Recent Labs  Lab 03/21/21 0322  NA 143  K 3.9  CL 110  CO2 22  GLUCOSE 131*  BUN 30*  CREATININE 1.40*  CALCIUM 8.8*   Liver Function Tests: Recent Labs  Lab 03/21/21 0322  AST 18  ALT 14  ALKPHOS 71  BILITOT 0.4  PROT 6.4*  ALBUMIN 3.4*   No results for input(s): LIPASE, AMYLASE in the last 168 hours. No results for input(s): AMMONIA in the last 168 hours. CBC: Recent Labs  Lab 03/21/21 0322  WBC 8.9  NEUTROABS 5.6  HGB 11.6*  HCT 36.9  MCV 101.9*  PLT 293   Cardiac Enzymes: No results for input(s): CKTOTAL, CKMB, CKMBINDEX, TROPONINI in the last 168 hours.  BNP (last 3 results) No results for input(s): BNP in the last 8760 hours.  ProBNP (last 3 results) No results for input(s): PROBNP in the last 8760 hours.  CBG: No results for input(s): GLUCAP in the last 168 hours.  Radiological Exams on Admission: DG Pelvis 1-2 Views  Result Date: 03/21/2021 CLINICAL DATA:  Fall, left hip pain EXAM: PELVIS - 1-2 VIEW COMPARISON:  None. FINDINGS: There is a pathologic fracture involving the subcapital left femoral neck with a lytic lesions seen within the superior aspect of the femoral neck and erosive changes noted along the fracture margin of the proximal femur. Marked varus angulation. Mild bilateral degenerative hip arthritis. No additional fracture or  dislocation identified. No additional lytic or blastic bone lesion. IMPRESSION: Pathologic subcapital left femoral neck fracture with marked varus angulation. Electronically Signed   By: Fidela Salisbury M.D.   On: 03/21/2021 04:25   DG Knee 2 Views Left  Result Date: 03/21/2021 CLINICAL DATA:  Left leg pain EXAM: LEFT KNEE - 1-2 VIEW COMPARISON:  None. FINDINGS: Single view radiograph left knee demonstrates normal alignment on this limited examination. No definite fracture or dislocation. Joint spaces are not well profiled. IMPRESSION: Limited but unremarkable examination. Electronically Signed   By: Fidela Salisbury M.D.   On: 03/21/2021 04:26   CT Head Wo Contrast  Result Date: 03/21/2021 CLINICAL DATA:  Fall, head and  neck trauma EXAM: CT HEAD WITHOUT CONTRAST CT CERVICAL SPINE WITHOUT CONTRAST TECHNIQUE: Multidetector CT imaging of the head and cervical spine was performed following the standard protocol without intravenous contrast. Multiplanar CT image reconstructions of the cervical spine were also generated. RADIATION DOSE REDUCTION: This exam was performed according to the departmental dose-optimization program which includes automated exposure control, adjustment of the mA and/or kV according to patient size and/or use of iterative reconstruction technique. COMPARISON:  None. FINDINGS: CT HEAD FINDINGS Brain: Normal anatomic configuration. Parenchymal volume loss is commensurate with the patient's age. Mild periventricular white matter changes are present likely reflecting the sequela of small vessel ischemia. No abnormal intra or extra-axial mass lesion or fluid collection. No abnormal mass effect or midline shift. No evidence of acute intracranial hemorrhage or infarct. Ventricular size is normal. Cerebellum unremarkable. Vascular: No asymmetric hyperdense vasculature at the skull base. Skull: Intact Sinuses/Orbits: Paranasal sinuses are clear. Ocular lenses have been removed. Orbits are otherwise  unremarkable. Other: Mastoid air cells and middle ear cavities are clear. CT CERVICAL SPINE FINDINGS Alignment: There is extension weighted cervical lordosis, likely positional in nature. 2 mm anterolisthesis C4-5. Skull base and vertebrae: Craniocervical alignment is normal. The atlantodental interval is not widened. There is a mild remote appearing compression deformity of C3 with minimal loss of height and minimal buckling of the posterior cortex without frank retropulsion. A remote appearing inferior endplate fracture of C6 is noted. No acute fracture of the cervical spine. Soft tissues and spinal canal: No prevertebral fluid or swelling. No visible canal hematoma. Disc levels: There is intervertebral disc space narrowing and endplate remodeling of Q0-H4, most severe at C5-6 and C6-7 in keeping with changes of moderate to severe degenerative disc disease. Prevertebral soft tissues are not thickened on sagittal reformats. Spinal canal is widely patent. Review of the axial images demonstrates multilevel uncovertebral and facet arthrosis resulting in mild neuroforaminal narrowing on the right at C4-5. Upper chest: Biapical scarring noted. Other: None IMPRESSION: No acute intracranial injury.  No calvarial fracture. No acute fracture or listhesis of the cervical spine. Electronically Signed   By: Fidela Salisbury M.D.   On: 03/21/2021 04:23   CT Cervical Spine Wo Contrast  Result Date: 03/21/2021 CLINICAL DATA:  Fall, head and neck trauma EXAM: CT HEAD WITHOUT CONTRAST CT CERVICAL SPINE WITHOUT CONTRAST TECHNIQUE: Multidetector CT imaging of the head and cervical spine was performed following the standard protocol without intravenous contrast. Multiplanar CT image reconstructions of the cervical spine were also generated. RADIATION DOSE REDUCTION: This exam was performed according to the departmental dose-optimization program which includes automated exposure control, adjustment of the mA and/or kV according to  patient size and/or use of iterative reconstruction technique. COMPARISON:  None. FINDINGS: CT HEAD FINDINGS Brain: Normal anatomic configuration. Parenchymal volume loss is commensurate with the patient's age. Mild periventricular white matter changes are present likely reflecting the sequela of small vessel ischemia. No abnormal intra or extra-axial mass lesion or fluid collection. No abnormal mass effect or midline shift. No evidence of acute intracranial hemorrhage or infarct. Ventricular size is normal. Cerebellum unremarkable. Vascular: No asymmetric hyperdense vasculature at the skull base. Skull: Intact Sinuses/Orbits: Paranasal sinuses are clear. Ocular lenses have been removed. Orbits are otherwise unremarkable. Other: Mastoid air cells and middle ear cavities are clear. CT CERVICAL SPINE FINDINGS Alignment: There is extension weighted cervical lordosis, likely positional in nature. 2 mm anterolisthesis C4-5. Skull base and vertebrae: Craniocervical alignment is normal. The atlantodental interval is not widened. There  is a mild remote appearing compression deformity of C3 with minimal loss of height and minimal buckling of the posterior cortex without frank retropulsion. A remote appearing inferior endplate fracture of C6 is noted. No acute fracture of the cervical spine. Soft tissues and spinal canal: No prevertebral fluid or swelling. No visible canal hematoma. Disc levels: There is intervertebral disc space narrowing and endplate remodeling of B1-Y7, most severe at C5-6 and C6-7 in keeping with changes of moderate to severe degenerative disc disease. Prevertebral soft tissues are not thickened on sagittal reformats. Spinal canal is widely patent. Review of the axial images demonstrates multilevel uncovertebral and facet arthrosis resulting in mild neuroforaminal narrowing on the right at C4-5. Upper chest: Biapical scarring noted. Other: None IMPRESSION: No acute intracranial injury.  No calvarial  fracture. No acute fracture or listhesis of the cervical spine. Electronically Signed   By: Fidela Salisbury M.D.   On: 03/21/2021 04:23   CT PELVIS WO CONTRAST  Result Date: 03/21/2021 CLINICAL DATA:  88 year old female status post fall with hip pain. Proximal left femur fracture. EXAM: CT PELVIS WITHOUT CONTRAST TECHNIQUE: Multidetector CT imaging of the pelvis was performed following the standard protocol without intravenous contrast. RADIATION DOSE REDUCTION: This exam was performed according to the departmental dose-optimization program which includes automated exposure control, adjustment of the mA and/or kV according to patient size and/or use of iterative reconstruction technique. COMPARISON:  Lumbar MRI 06/17/2016. FINDINGS: Urinary Tract: Kidneys not included. No hydroureter. Unremarkable bladder. Incidental pelvic phleboliths. Bowel: No dilated large or small bowel. Diverticulosis throughout the sigmoid and visible descending colon. No bowel inflammation identified. No pneumoperitoneum identified. However, there is a small right inguinal hernia containing a small bowel loop and adjacent mesentery (series 4, image 325). The hernia is approximately 4 cm in length, and does not appear incarcerated. Vascular/Lymphatic: Vascular patency is not evaluated in the absence of IV contrast. Calcified atherosclerosis at the aortoiliac bifurcation. No inguinal or pelvic lymphadenopathy identified. Reproductive: Surgically absent uterus. Normal ovaries along the pelvic sidewalls. Other:  No pelvic free fluid. Musculoskeletal: Moderate to severe L4 compression fracture corresponding to that seen in 2018 with superior endplate deformity and retropulsion of the posterosuperior endplate contributing to severe L3-L4 spinal stenosis which is partially visible. L5 vertebra, sacrum and SI joints appear intact with relatively typical osteopenia for age. There are chronic bilateral pubic rami fractures. Iliac wing and pelvic  bone mineralization also appears age-appropriate. Proximal right femur is intact with no discrete bone lesion. Subcapital left femoral neck fracture with varus impaction and posterior rotation of the femoral head is noted. There is a conspicuous roughly 18 mm radiolucent lesion along the superolateral femoral head fragment as seen radiographically (series 6, image 79). But the other proximal left femur bone mineralization is normal for age. No significant hematoma about the hip fracture. Superficial body wall and pelvic, proximal lower extremity muscular Cher in general appears normal. IMPRESSION: 1. Acute, impacted subcapital left femoral neck fracture with an 18 mm lucent bone lesion along the fracture margin as seen radiographically. This does suggest a pathologic fracture, but no other primary bone lesions are identified about the pelvis. Age related osteopenia is noted. 2. No other acute osseous abnormality identified. Chronic bilateral pubic rami fractures. Chronic L4 compression fracture with retropulsion and severe L3-L4 spinal stenosis stable since 2018. 3. Small right inguinal hernia containing a small bowel loop and mesentery does not appear incarcerated. Diverticulosis of the distal colon. Aortic Atherosclerosis (ICD10-I70.0). Electronically Signed   By: Lemmie Evens  [place] Crane M.D.   On: 03/21/2021 04:55   DG Femur Min 2 Views Left  Result Date: 03/21/2021 CLINICAL DATA:  Left hip fracture EXAM: LEFT FEMUR 2 VIEWS COMPARISON:  None. FINDINGS: There is a pathologic subcapital femoral neck fracture of the left hip with a lytic lesion involving the superior aspect of the subcapital femoral neck. Override and marked varus angulation of the femoral shaft. Femoral head is still seated within the left acetabulum with superimposed mild-to-moderate degenerative hip arthritis. No additional fracture or dislocation. No additional lytic or blastic bone lesion. IMPRESSION: Pathologic left subcapital femoral neck fracture with  override and marked varus angulation. Contrast enhanced MRI examination and bone scintigraphy may be helpful for further evaluation. Electronically Signed   By: Fidela Salisbury M.D.   On: 03/21/2021 04:29    EKG: I independently viewed the EKG done and my findings are as followed: None available at the time of this visit.  Assessment/Plan Present on Admission:  Closed left hip fracture (HCC)  Principal Problem:   Closed left hip fracture (HCC)  Left hip fracture on Eliquis post fall, POA Unable to obtain a reliable history from the patient due to advanced dementia. History obtained from her daughter via phone. No syncope, her grandson was in the house at the time of the fall. Got out of her bed around 2 AM took a few steps and fell Left pelvic x-ray showing pathologic subcapital left femoral neck fracture with mild varus angulation. Orthopedic surgery Dr. Sammuel Hines consulted by EDP, recommended transfer to [facility] for tentative left hip repair on 03/23/2021. Pain control along with bowel regimen.  Acute blood loss anemia in the setting of left hip fracture Drop in hemoglobin 11.6, continue to monitor H&H. Transfuse hemoglobin if indicated. Home Eliquis on hold Repeat CBC in the morning  History of DVT/PE on Eliquis Eliquis on hold to allow for washout prior to orthopedic surgery DVT and PE were diagnosed 2 years ago Obtain bilateral lower extremity Doppler ultrasound to rule out DVT, if positive consider heparin drip Continue to hold off home Eliquis.  AKI on CKD 3 A, suspect prerenal in the setting of poor oral intake. Baseline creatinine appears to be 0.9 with GFR 56 Presented with creatinine 1.40 with GFR 36 Avoid nephrotoxic agents, dehydration and hypotension. Start gentle IV fluid hydration LR 50 cc/h x 2 days. Monitor urine output with strict I's and O's.  Essential hypertension, BP not at goal, elevated. Hold off home oral antihypertensives due to AKI. Hold off home quinapril  and HCTZ Start Norvasc 5 mg daily IV hydralazine as needed with parameters Continue to closely monitor vital signs.  Advanced dementia Reorient as needed Delirium/aspiration/fall precautions PT OT to assess postsurgery with orthopedic surgery's guidance.    DVT prophylaxis: SCDs  Code Status: Full code, for now as stated by her daughter who is also her medical POA.  Family Communication: Updated her daughter via phone  Disposition Plan: Admitted to MedSurg  Consults called: Orthopedic surgery consulted by EDP.  Admission status: Inpatient status.   Status is: Inpatient Patient requires at least 2 midnights for further evaluation and treatment of present condition.           Kayleen Memos MD [facility] Pager [phone]  If 7PM-7AM, please contact night-coverage [email or web address] Password [email or web address]  03/21/2021, 11:43 PM

## 2021-03-21 NOTE — Progress Notes (Addendum)
DUE TO COVID-19 ONLY ONE VISITOR IS ALLOWED TO COME WITH YOU AND STAY IN THE WAITING ROOM ONLY DURING PRE OP AND PROCEDURE DAY OF SURGERY.  ? ?Two VISITORS MAY VISIT WITH YOU AFTER SURGERY IN YOUR PRIVATE ROOM DURING VISITING HOURS ONLY! ? ?PCP - Dr Cari Caraway ?Cardiologist - n/a ?Neurology - Debbora Presto, NP ? ?Chest x-ray - n/a ?EKG - DOS ?Stress Test - n/a ?ECHO - n/a ?Cardiac Cath - n/a ? ?ICD Pacemaker/Loop - n/a ? ?Sleep Study -  n/a ?CPAP - none ? ?Blood Thinner Instructions:  Follow your surgeon's instructions on when to stop Eliquis prior to surgery.  Per Daughter [name], last dose was on 03/20/21.  ? ?STOP now taking any Aspirin (unless otherwise instructed by your surgeon), Aleve, Naproxen, Ibuprofen, Motrin, Advil, Goody's, BC's, all herbal medications, fish oil, and all vitamins.  ? ?Coronavirus Screening ?Covid test is scheduled on DOS. ?Do you have any of the following symptoms:  ?Cough yes/no: No ?Fever (>100.7F)  yes/no: No ?Runny nose yes/no: No ?Sore throat yes/no: No ?Difficulty breathing/shortness of breath  yes/no: No ? ?Have you traveled in the last 14 days and where? yes/no: No ? ?Daughter Garald Braver [phone]) verbalized understanding of instructions that were given via phone. ?

## 2021-03-21 NOTE — ED Notes (Signed)
Daughter asked to be contacted on room assignment ?

## 2021-03-21 NOTE — ED Notes (Signed)
Pt oxygen saturation dropped to 86% after medication administration. Placed pt on 2L [place] and O2 saturation returned to 96%. ?

## 2021-03-22 ENCOUNTER — Inpatient Hospital Stay (HOSPITAL_COMMUNITY): Payer: Medicare HMO

## 2021-03-22 DIAGNOSIS — S72002A Fracture of unspecified part of neck of left femur, initial encounter for closed fracture: Secondary | ICD-10-CM | POA: Diagnosis not present

## 2021-03-22 DIAGNOSIS — I10 Essential (primary) hypertension: Secondary | ICD-10-CM

## 2021-03-22 DIAGNOSIS — N179 Acute kidney failure, unspecified: Secondary | ICD-10-CM | POA: Diagnosis not present

## 2021-03-22 DIAGNOSIS — I2699 Other pulmonary embolism without acute cor pulmonale: Secondary | ICD-10-CM | POA: Diagnosis not present

## 2021-03-22 LAB — URINALYSIS, ROUTINE W REFLEX MICROSCOPIC
Bacteria, UA: NONE SEEN
Bilirubin Urine: NEGATIVE
Glucose, UA: NEGATIVE mg/dL
Hgb urine dipstick: NEGATIVE
Ketones, ur: NEGATIVE mg/dL
Nitrite: NEGATIVE
Protein, ur: NEGATIVE mg/dL
Specific Gravity, Urine: 1.017 (ref 1.005–1.030)
pH: 5 (ref 5.0–8.0)

## 2021-03-22 LAB — RENAL FUNCTION PANEL
Albumin: 2.9 g/dL — ABNORMAL LOW (ref 3.5–5.0)
Anion gap: 8 (ref 5–15)
BUN: 32 mg/dL — ABNORMAL HIGH (ref 8–23)
CO2: 22 mmol/L (ref 22–32)
Calcium: 8.8 mg/dL — ABNORMAL LOW (ref 8.9–10.3)
Chloride: 110 mmol/L (ref 98–111)
Creatinine, Ser: 1.36 mg/dL — ABNORMAL HIGH (ref 0.44–1.00)
GFR, Estimated: 37 mL/min — ABNORMAL LOW (ref >60)
Glucose, Bld: 128 mg/dL — ABNORMAL HIGH (ref 70–99)
Phosphorus: 3.4 mg/dL (ref 2.5–4.6)
Potassium: 4.5 mmol/L (ref 3.5–5.1)
Sodium: 140 mmol/L (ref 135–145)

## 2021-03-22 LAB — CBC
HCT: 35 % — ABNORMAL LOW (ref 36.0–46.0)
Hemoglobin: 11.1 g/dL — ABNORMAL LOW (ref 12.0–15.0)
MCH: 32.2 pg (ref 26.0–34.0)
MCHC: 31.7 g/dL (ref 30.0–36.0)
MCV: 101.4 fL — ABNORMAL HIGH (ref 80.0–100.0)
Platelets: 241 10*3/uL (ref 150–400)
RBC: 3.45 MIL/uL — ABNORMAL LOW (ref 3.87–5.11)
RDW: 12.4 % (ref 11.5–15.5)
WBC: 10.1 10*3/uL (ref 4.0–10.5)
nRBC: 0 % (ref 0.0–0.2)

## 2021-03-22 LAB — MAGNESIUM: Magnesium: 2 mg/dL (ref 1.7–2.4)

## 2021-03-22 MED ORDER — POLYETHYLENE GLYCOL 3350 17 G PO PACK: 17.0000 g | PACK | Freq: Every day | ORAL | Status: DC | PRN

## 2021-03-22 MED ORDER — AMLODIPINE BESYLATE 5 MG PO TABS
5.0000 mg | ORAL_TABLET | Freq: Every day | ORAL | Status: DC
  Administered 2021-03-22 – 2021-03-27 (×5): 5 mg via ORAL
  Filled 2021-03-22 (×6): qty 1

## 2021-03-22 MED ORDER — MELATONIN 3 MG PO TABS
3.0000 mg | ORAL_TABLET | Freq: Every day | ORAL | Status: DC
  Administered 2021-03-22 – 2021-03-26 (×6): 3 mg via ORAL
  Filled 2021-03-22 (×6): qty 1

## 2021-03-22 MED ORDER — FOLIC ACID 1 MG PO TABS
1.0000 mg | ORAL_TABLET | Freq: Every day | ORAL | Status: DC
  Administered 2021-03-22 – 2021-03-27 (×5): 1 mg via ORAL
  Filled 2021-03-22 (×6): qty 1

## 2021-03-22 MED ORDER — HYDROMORPHONE HCL 1 MG/ML IJ SOLN
0.5000 mg | INTRAMUSCULAR | Status: DC | PRN
  Administered 2021-03-22 – 2021-03-23 (×3): 0.5 mg via INTRAVENOUS
  Filled 2021-03-22 (×3): qty 0.5

## 2021-03-22 MED ORDER — LACTATED RINGERS IV SOLN
INTRAVENOUS | Status: DC
  Administered 2021-03-22: 1000 mL via INTRAVENOUS

## 2021-03-22 MED ORDER — HYDRALAZINE HCL 20 MG/ML IJ SOLN: 10.0000 mg | Freq: Three times a day (TID) | INTRAMUSCULAR | Status: DC | PRN

## 2021-03-22 MED ORDER — CYANOCOBALAMIN 500 MCG PO TABS
250.0000 ug | ORAL_TABLET | Freq: Every day | ORAL | Status: DC
  Administered 2021-03-22 – 2021-03-27 (×5): 250 ug via ORAL
  Filled 2021-03-22 (×6): qty 1

## 2021-03-22 MED ORDER — ONDANSETRON HCL 4 MG/2ML IJ SOLN: 4.0000 mg | Freq: Four times a day (QID) | INTRAMUSCULAR | Status: DC | PRN

## 2021-03-22 MED ORDER — HYDRALAZINE HCL 20 MG/ML IJ SOLN
5.0000 mg | Freq: Three times a day (TID) | INTRAMUSCULAR | Status: DC | PRN
  Administered 2021-03-22: 5 mg via INTRAVENOUS
  Filled 2021-03-22: qty 1

## 2021-03-22 MED ORDER — ACETAMINOPHEN 325 MG PO TABS: 650.0000 mg | ORAL_TABLET | Freq: Four times a day (QID) | ORAL | Status: DC | PRN

## 2021-03-22 MED ORDER — OXYCODONE HCL 5 MG PO TABS
5.0000 mg | ORAL_TABLET | ORAL | Status: DC | PRN
Start: 2021-03-22 — End: 2021-03-24
  Administered 2021-03-22 – 2021-03-24 (×5): 5 mg via ORAL
  Filled 2021-03-22 (×5): qty 1

## 2021-03-22 NOTE — Progress Notes (Signed)
? ?  Subjective: ? ?Pain controlled with medication. Pending OR 3/5. No issues overnight. ? ?Objective:  ? ?VITALS:   ?Vitals:  ? 03/21/21 2236 03/21/21 2353 03/22/21 7939 03/22/21 0300  ?BP:   (!) 161/87 (!) 197/88  ?Pulse:   96   ?Resp: 19  16   ?Temp: 98.7 ?F (37.1 ?C)  98.7 ?F (37.1 ?C)   ?TempSrc: Oral  Oral   ?SpO2:   99%   ?Weight: 59 kg     ?Height:  5' (1.524 m)    ? ? ?MUSCULOSKELETAL:  ?Left hip is shortened externally rotated.  She able to flex and extend all of the toes of the left foot.  Sensation intact all distributions of the left foot ?  ? ? ?Lab Results  ?Component Value Date  ? WBC 10.1 03/22/2021  ? HGB 11.1 (L) 03/22/2021  ? HCT 35.0 (L) 03/22/2021  ? MCV 101.4 (H) 03/22/2021  ? PLT 241 03/22/2021  ? ? ? ?Assessment/Plan: ? ? Plan for left hip hemiarthroplasty with Dr. Marlou Sa on 3/5, NPO at [facility] tonight ? ?Continue to hold anticoagulation ?  ?  ?After a lengthy discussion of treatment options, including risks, benefits, alternatives, complications of surgical and nonsurgical conservative options, the patient elected surgical repair.  ?  ?The patient  is aware of the material risks  and complications including, but not limited to injury to adjacent structures, neurovascular injury, infection, numbness, bleeding, implant failure, thermal burns, stiffness, persistent pain, failure to heal, disease transmission from allograft, need for further surgery, dislocation, anesthetic risks, blood clots, risks of death,and others. The probabilities of surgical success and failure discussed with patient given their particular co-morbidities.The time and nature of expected rehabilitation and recovery was discussed.The patient's questions were all answered preoperatively.  No barriers to understanding were noted. ?I explained the natural history of the disease process and Rx rationale.  I explained to the patient what I considered to be reasonable expectations given their personal situation.  The final treatment  plan was arrived at through a shared patient decision making process model. Vanetta Mulders ?03/22/2021, 7:49 AM  ?

## 2021-03-22 NOTE — Progress Notes (Signed)
Patient seen and examined ?Has left hip fracture femoral neck displaced ?Off Eliquis now for 2 days ?Plan hip hemiarthroplasty versus total hip replacement in the morning ?Risk and benefits discussed with the patient and her nephew.  They include but are not limited to infection nerve vessel damage dislocation as well as perioperative medical complications due to the stress of surgery and this injury including but not limited to stroke heart attack and death. ?Plan also to speak to the patient's more immediate family in the morning prior to surgery.  All questions answered ?

## 2021-03-22 NOTE — Progress Notes (Signed)
OT Cancellation Note ? ?Patient Details ?Name: Julie Martinez ?MRN: [id number] ?DOB: May 16, 1931 ? ? ?Cancelled Treatment:    Reason Eval/Treat Not Completed: Patient not medically ready - pt for surgical intervention on 3/5.  Will check back post surgery. ? ?Socrates Cahoon C., OTR/L ?Acute Rehabilitation Services ?Pager [phone] ?Office [phone] ? ? ?[name] ?03/22/2021, 8:39 AM ?

## 2021-03-22 NOTE — Plan of Care (Signed)

## 2021-03-22 NOTE — Plan of Care (Signed)
Patient arrived from [place] via stretcher. Alert and oriented to self only. Patient settled into room, assessment completed, Hospitalist made aware of patient arrival. Will continue to monitor according to orders and plan of care. ?

## 2021-03-22 NOTE — Progress Notes (Signed)
PROGRESS NOTE  Julie Martinez IHW:[id number] DOB: 10/22/1931 DOA: 03/21/2021 PCP: Cari Caraway, MD  HPI/Recap of past 24 hours: Julie Martinez is a 88 y.o. female with medical history significant for advanced dementia, DVT/PE on Eliquis, HTN, who presented from home after a fall.  Patient lives at home. History is obtained from her daughter who is also her medical POA via phone.  Per her daughter pt got out of bed took a few steps then fell. No LOC. She was in her usual state of health prior to this. In ED, VS fairly stable, lab studies remarkable for creatinine 1.43, baseline 0.9. Left hip x-ray shows subcapital hip fracture.  Orthopedic surgery, Dr. Sammuel Hines consulted. Patient is on Eliquis, allow for washout with surgical date 03/23/2021.  TR[facility], hospitalist service, was asked to admit.     Today, patient pleasantly confused, denies any new complaints.  Met nephew at bedside, all questions answered.   Assessment/Plan: Principal Problem:   Closed left hip fracture (HCC)   Left hip fracture on Eliquis post fall, POA Left pelvic x-ray showing pathologic subcapital left femoral neck fracture with mild varus angulation. Orthopedic surgery consulted, plan for left hip repair on 03/23/2021 DVT PPx, pain management as per Ortho PT/OT when appropriate   History of DVT/PE on Eliquis Eliquis on hold to allow for washout prior to orthopedic surgery DVT and PE were diagnosed 2 years ago BLE Doppler neg for DVT Continue to hold off home Eliquis   AKI on CKD 3a, suspect prerenal in the setting of poor oral intake. Baseline creatinine appears to be 0.9- 1, Cr 1.4 on admission Continue gentle IVF Monitor urine output with strict I's and O's.   Essential hypertension BP better control, likely worsened by pain Continue recently started Norvasc, IV hydralazine prn Continue to hold home quinapril and HCTZ due to AKI  ??Umbilical lesion Noted some redness, tenderness, minimal drainage within  umbilicus WOC consulted   Advanced dementia Reorient as needed Delirium/aspiration/fall precautions PT OT to assess postsurgery with orthopedic surgery's guidance      Estimated body mass index is 25.4 kg/m as calculated from the following:   Height as of this encounter: 5' (1.524 m).   Weight as of this encounter: 59 kg.     Code Status: Full  Family Communication: Nephew at bedside  Disposition Plan: Status is: Inpatient Remains inpatient appropriate because: Level of care    Consultants: Orthopedics  Procedures: None  Antimicrobials: None  DVT prophylaxis: SCD   Objective: Vitals:   03/21/21 2353 03/22/21 0504 03/22/21 0641 03/22/21 0803  BP:  (!) 161/87 (!) 197/88 (!) 129/53  Pulse:  96  (!) 107  Resp:  16  20  Temp:  98.7 F (37.1 C)  99 F (37.2 C)  TempSrc:  Oral  Oral  SpO2:  99%  93%  Weight:      Height: 5' (1.524 m)       Intake/Output Summary (Last 24 hours) at 03/22/2021 1540 Last data filed at 03/22/2021 1002 Gross per 24 hour  Intake 119.09 ml  Output 400 ml  Net -280.91 ml   Filed Weights   03/21/21 0325 03/21/21 2236  Weight: 54.4 kg 59 kg    Exam: General: NAD  Cardiovascular: S1, S2 present Respiratory: CTAB Abdomen: Soft, nontender, nondistended, bowel sounds present, some redness, tenderness, minimal drainage within umbilicus Musculoskeletal: No bilateral pedal edema noted Skin: As noted above, multiple bruises noted in extremities Psychiatry: Unable to assess    Data  Reviewed: CBC: Recent Labs  Lab 03/21/21 0322 03/22/21 0210  WBC 8.9 10.1  NEUTROABS 5.6  --   HGB 11.6* 11.1*  HCT 36.9 35.0*  MCV 101.9* 101.4*  PLT 293 007   Basic Metabolic Panel: Recent Labs  Lab 03/21/21 0322 03/22/21 0210  NA 143 140  K 3.9 4.5  CL 110 110  CO2 22 22  GLUCOSE 131* 128*  BUN 30* 32*  CREATININE 1.40* 1.36*  CALCIUM 8.8* 8.8*  MG  --  2.0  PHOS  --  3.4   GFR: Estimated Creatinine Clearance: 23 mL/min (A) (by  C-G formula based on SCr of 1.36 mg/dL (H)). Liver Function Tests: Recent Labs  Lab 03/21/21 0322 03/22/21 0210  AST 18  --   ALT 14  --   ALKPHOS 71  --   BILITOT 0.4  --   PROT 6.4*  --   ALBUMIN 3.4* 2.9*   No results for input(s): LIPASE, AMYLASE in the last 168 hours. No results for input(s): AMMONIA in the last 168 hours. Coagulation Profile: No results for input(s): INR, PROTIME in the last 168 hours. Cardiac Enzymes: No results for input(s): CKTOTAL, CKMB, CKMBINDEX, TROPONINI in the last 168 hours. BNP (last 3 results) No results for input(s): PROBNP in the last 8760 hours. HbA1C: No results for input(s): HGBA1C in the last 72 hours. CBG: No results for input(s): GLUCAP in the last 168 hours. Lipid Profile: No results for input(s): CHOL, HDL, LDLCALC, TRIG, CHOLHDL, LDLDIRECT in the last 72 hours. Thyroid Function Tests: No results for input(s): TSH, T4TOTAL, FREET4, T3FREE, THYROIDAB in the last 72 hours. Anemia Panel: No results for input(s): VITAMINB12, FOLATE, FERRITIN, TIBC, IRON, RETICCTPCT in the last 72 hours. Urine analysis:    Component Value Date/Time   COLORURINE YELLOW 03/22/2021 0130   APPEARANCEUR CLEAR 03/22/2021 0130   LABSPEC 1.017 03/22/2021 0130   PHURINE 5.0 03/22/2021 0130   GLUCOSEU NEGATIVE 03/22/2021 0130   HGBUR NEGATIVE 03/22/2021 0130   BILIRUBINUR NEGATIVE 03/22/2021 0130   KETONESUR NEGATIVE 03/22/2021 0130   PROTEINUR NEGATIVE 03/22/2021 0130   UROBILINOGEN 1.0 11/12/2014 1525   NITRITE NEGATIVE 03/22/2021 0130   LEUKOCYTESUR TRACE (A) 03/22/2021 0130   Sepsis Labs: _0 (procalcitonin:4,lacticidven:4)  ) Recent Results (from the past 240 hour(s))  Resp Panel by RT-PCR (Flu A&B, Covid) Nasopharyngeal Swab     Status: None   Collection Time: 03/21/21  4:26 AM   Specimen: Nasopharyngeal Swab; Nasopharyngeal(NP) swabs in vial transport medium  Result Value Ref Range Status   SARS Coronavirus 2 by RT PCR NEGATIVE  NEGATIVE Final    Comment: (NOTE) SARS-CoV-2 target nucleic acids are NOT DETECTED.  The SARS-CoV-2 RNA is generally detectable in upper respiratory specimens during the acute phase of infection. The lowest concentration of SARS-CoV-2 viral copies this assay can detect is 138 copies/mL. A negative result does not preclude SARS-Cov-2 infection and should not be used as the sole basis for treatment or other patient management decisions. A negative result may occur with  improper specimen collection/handling, submission of specimen other than nasopharyngeal swab, presence of viral mutation(s) within the areas targeted by this assay, and inadequate number of viral copies(<138 copies/mL). A negative result must be combined with clinical observations, patient history, and epidemiological information. The expected result is Negative.  Fact Sheet for Patients:  EntrepreneurPulse.com.au  Fact Sheet for Healthcare Providers:  IncredibleEmployment.be  This test is no t yet approved or cleared by the [place] and  has been authorized  for detection and/or diagnosis of SARS-CoV-2 by FDA under an Emergency Use Authorization (EUA). This EUA will remain  in effect (meaning this test can be used) for the duration of the COVID-19 declaration under Section 564(b)(1) of the Act, 21 U.S.C.section 360bbb-3(b)(1), unless the authorization is terminated  or revoked sooner.       Influenza A by PCR NEGATIVE NEGATIVE Final   Influenza B by PCR NEGATIVE NEGATIVE Final    Comment: (NOTE) The Xpert Xpress SARS-CoV-2/FLU/RSV plus assay is intended as an aid in the diagnosis of influenza from Nasopharyngeal swab specimens and should not be used as a sole basis for treatment. Nasal washings and aspirates are unacceptable for Xpert Xpress SARS-CoV-2/FLU/RSV testing.  Fact Sheet for Patients: EntrepreneurPulse.com.au  Fact Sheet for Healthcare  Providers: IncredibleEmployment.be  This test is not yet approved or cleared by the [place] FDA and has been authorized for detection and/or diagnosis of SARS-CoV-2 by FDA under an Emergency Use Authorization (EUA). This EUA will remain in effect (meaning this test can be used) for the duration of the COVID-19 declaration under Section 564(b)(1) of the Act, 21 U.S.C. section 360bbb-3(b)(1), unless the authorization is terminated or revoked.  Performed at [facility], [address] [place], [place] [address]       Studies: VAS [place] LOWER EXTREMITY VENOUS (DVT)  Result Date: 03/22/2021  Lower Venous DVT Study Patient Name:  LIZET KELSO  Date of Exam:   03/22/2021 Medical Rec #: [id number]         Accession #:    [id number] Date of Birth: [birth year]         Patient Gender: F Patient Age:   66 years Exam Location:  [facility] Procedure:      VAS [place] LOWER EXTREMITY VENOUS (DVT) Referring Phys: Irene Pap --------------------------------------------------------------------------------  Indications: Pulmonary embolism.  Risk Factors: Trauma. Limitations: Poor ultrasound/tissue interface and patient positioning, patient immobility, patient pain tolerance, poor patient cooperation. Comparison Study: No prior studies. Performing Technologist: Oliver Hum RVT  Examination Guidelines: A complete evaluation includes B-mode imaging, spectral Doppler, color Doppler, and power Doppler as needed of all accessible portions of each vessel. Bilateral testing is considered an integral part of a complete examination. Limited examinations for reoccurring indications may be performed as noted. The reflux portion of the exam is performed with the patient in reverse Trendelenburg.  +---------+---------------+---------+-----------+----------+-------------------+  RIGHT     Compressibility Phasicity Spontaneity Properties Thrombus Aging        +---------+---------------+---------+-----------+----------+-------------------+  CFV       Full            Yes       Yes                                         +---------+---------------+---------+-----------+----------+-------------------+  SFJ       Full                                                                  +---------+---------------+---------+-----------+----------+-------------------+  FV Prox   Full                                                                  +---------+---------------+---------+-----------+----------+-------------------+  FV Mid                    Yes       Yes                                         +---------+---------------+---------+-----------+----------+-------------------+  FV Distal                 Yes       Yes                                         +---------+---------------+---------+-----------+----------+-------------------+  PFV       Full                                                                  +---------+---------------+---------+-----------+----------+-------------------+  POP       Full            Yes       Yes                                         +---------+---------------+---------+-----------+----------+-------------------+  PTV       Full                                                                  +---------+---------------+---------+-----------+----------+-------------------+  PERO                                                       Not well visualized  +---------+---------------+---------+-----------+----------+-------------------+   +---------+---------------+---------+-----------+----------+-------------------+  LEFT      Compressibility Phasicity Spontaneity Properties Thrombus Aging       +---------+---------------+---------+-----------+----------+-------------------+  CFV       Full            Yes       Yes                                         +---------+---------------+---------+-----------+----------+-------------------+   SFJ       Full                                                                  +---------+---------------+---------+-----------+----------+-------------------+  FV Prox   Full                                                                  +---------+---------------+---------+-----------+----------+-------------------+  FV Mid                    Yes       Yes                                         +---------+---------------+---------+-----------+----------+-------------------+  FV Distal                 Yes       Yes                                         +---------+---------------+---------+-----------+----------+-------------------+  PFV       Full                                                                  +---------+---------------+---------+-----------+----------+-------------------+  POP                                                        Not well visualized  +---------+---------------+---------+-----------+----------+-------------------+  PTV       Full                                                                  +---------+---------------+---------+-----------+----------+-------------------+  PERO                                                       Not well visualized  +---------+---------------+---------+-----------+----------+-------------------+     Summary: RIGHT: - There is no evidence of deep vein thrombosis in the lower extremity. However, portions of this examination were limited- see technologist comments above.  - No cystic structure found in the popliteal fossa.  LEFT: - There is no evidence of deep vein thrombosis in the lower extremity. However, portions of this examination were limited- see technologist comments above.  - No cystic structure found in the popliteal fossa.  *See table(s) above for measurements and observations.    Preliminary     Scheduled Meds:  amLODipine  5 mg Oral Daily   folic acid  1 mg Oral Daily   melatonin  3 mg Oral QHS   vitamin B-12  250 mcg  Oral Daily    Continuous Infusions:  lactated ringers 50 mL/hr at 03/22/21 0907     LOS: 1 day     Alma Friendly, MD [facility]  If 7PM-7AM, please contact night-coverage [email or web address] 03/22/2021, 3:40 PM

## 2021-03-22 NOTE — Progress Notes (Signed)
PT Cancellation Note ? ?Patient Details ?Name: [name] ?MRN: [id number] ?DOB: [birth year] ? ? ?Cancelled Treatment:    Reason Eval/Treat Not Completed: Other (comment) Noted PT orders with start date of 3/6 after planned surgical intervention on 3/5. Will follow and evaluate on Monday 3/6.  ? ?West Carbo, PT, DPT  ? ?Acute Rehabilitation Department ?Pager #: [phone] - [phone] ? ? ?Sandra Cockayne ?03/22/2021, 7:41 AM ?

## 2021-03-22 NOTE — Progress Notes (Signed)
Bilateral lower extremity venous duplex has been completed. ?Preliminary results can be found in CV Proc through chart review.  ? ?03/22/21 11:44 AM ?Julie Martinez RVT   ?

## 2021-03-23 ENCOUNTER — Inpatient Hospital Stay (HOSPITAL_COMMUNITY): Payer: Medicare HMO | Admitting: Certified Registered Nurse Anesthetist

## 2021-03-23 ENCOUNTER — Other Ambulatory Visit: Payer: Self-pay

## 2021-03-23 ENCOUNTER — Encounter (HOSPITAL_COMMUNITY)
Admission: EM | Disposition: A | Discharge status: Skilled Nursing Facility | Payer: Self-pay | Source: Home / Self Care | Attending: Internal Medicine

## 2021-03-23 ENCOUNTER — Inpatient Hospital Stay (HOSPITAL_COMMUNITY): Payer: Medicare HMO

## 2021-03-23 ENCOUNTER — Inpatient Hospital Stay (HOSPITAL_COMMUNITY): Admission: RE | Admit: 2021-03-23 | Payer: Medicare HMO | Source: Home / Self Care | Admitting: Orthopedic Surgery

## 2021-03-23 ENCOUNTER — Encounter (HOSPITAL_COMMUNITY): Payer: Self-pay | Admitting: Internal Medicine

## 2021-03-23 DIAGNOSIS — S72002S Fracture of unspecified part of neck of left femur, sequela: Secondary | ICD-10-CM

## 2021-03-23 DIAGNOSIS — S72022A Displaced fracture of epiphysis (separation) (upper) of left femur, initial encounter for closed fracture: Secondary | ICD-10-CM

## 2021-03-23 DIAGNOSIS — N179 Acute kidney failure, unspecified: Secondary | ICD-10-CM | POA: Diagnosis not present

## 2021-03-23 DIAGNOSIS — I10 Essential (primary) hypertension: Secondary | ICD-10-CM | POA: Diagnosis not present

## 2021-03-23 DIAGNOSIS — I129 Hypertensive chronic kidney disease with stage 1 through stage 4 chronic kidney disease, or unspecified chronic kidney disease: Secondary | ICD-10-CM

## 2021-03-23 DIAGNOSIS — N189 Chronic kidney disease, unspecified: Secondary | ICD-10-CM

## 2021-03-23 DIAGNOSIS — S72002A Fracture of unspecified part of neck of left femur, initial encounter for closed fracture: Secondary | ICD-10-CM | POA: Diagnosis not present

## 2021-03-23 DIAGNOSIS — D631 Anemia in chronic kidney disease: Secondary | ICD-10-CM

## 2021-03-23 HISTORY — DX: Chronic kidney disease, unspecified: N18.9

## 2021-03-23 HISTORY — PX: HIP ARTHROPLASTY: SHX981

## 2021-03-23 HISTORY — DX: Dependence on other enabling machines and devices: Z99.89

## 2021-03-23 HISTORY — DX: Dyspnea, unspecified: R06.00

## 2021-03-23 HISTORY — DX: Anxiety disorder, unspecified: F41.9

## 2021-03-23 LAB — TYPE AND SCREEN
ABO/RH(D): O POS
Antibody Screen: NEGATIVE

## 2021-03-23 LAB — BASIC METABOLIC PANEL
Anion gap: 8 (ref 5–15)
BUN: 41 mg/dL — ABNORMAL HIGH (ref 8–23)
CO2: 24 mmol/L (ref 22–32)
Calcium: 8.6 mg/dL — ABNORMAL LOW (ref 8.9–10.3)
Chloride: 108 mmol/L (ref 98–111)
Creatinine, Ser: 1.66 mg/dL — ABNORMAL HIGH (ref 0.44–1.00)
GFR, Estimated: 29 mL/min — ABNORMAL LOW (ref >60)
Glucose, Bld: 100 mg/dL — ABNORMAL HIGH (ref 70–99)
Potassium: 4.5 mmol/L (ref 3.5–5.1)
Sodium: 140 mmol/L (ref 135–145)

## 2021-03-23 LAB — CBC WITH DIFFERENTIAL/PLATELET
Abs Immature Granulocytes: 0.06 10*3/uL (ref 0.00–0.07)
Basophils Absolute: 0 10*3/uL (ref 0.0–0.1)
Basophils Relative: 0 %
Eosinophils Absolute: 0.4 10*3/uL (ref 0.0–0.5)
Eosinophils Relative: 4 %
HCT: 30.9 % — ABNORMAL LOW (ref 36.0–46.0)
Hemoglobin: 10 g/dL — ABNORMAL LOW (ref 12.0–15.0)
Immature Granulocytes: 1 %
Lymphocytes Relative: 13 %
Lymphs Abs: 1.2 10*3/uL (ref 0.7–4.0)
MCH: 33.1 pg (ref 26.0–34.0)
MCHC: 32.4 g/dL (ref 30.0–36.0)
MCV: 102.3 fL — ABNORMAL HIGH (ref 80.0–100.0)
Monocytes Absolute: 1.2 10*3/uL — ABNORMAL HIGH (ref 0.1–1.0)
Monocytes Relative: 13 %
Neutro Abs: 6.4 10*3/uL (ref 1.7–7.7)
Neutrophils Relative %: 69 %
Platelets: 222 10*3/uL (ref 150–400)
RBC: 3.02 MIL/uL — ABNORMAL LOW (ref 3.87–5.11)
RDW: 12.7 % (ref 11.5–15.5)
WBC: 9.3 10*3/uL (ref 4.0–10.5)
nRBC: 0 % (ref 0.0–0.2)

## 2021-03-23 LAB — SURGICAL PCR SCREEN
MRSA, PCR: NEGATIVE
Staphylococcus aureus: POSITIVE — AB

## 2021-03-23 LAB — ABO/RH: ABO/RH(D): O POS

## 2021-03-23 SURGERY — HEMIARTHROPLASTY, HIP, DIRECT ANTERIOR APPROACH, FOR FRACTURE
Anesthesia: General | Site: Hip | Laterality: Left

## 2021-03-23 MED ORDER — PHENYLEPHRINE 40 MCG/ML (10ML) SYRINGE FOR IV PUSH (FOR BLOOD PRESSURE SUPPORT)
PREFILLED_SYRINGE | INTRAVENOUS | Status: DC | PRN
Start: 2021-03-23 — End: 2021-03-23
  Administered 2021-03-23: 40 ug via INTRAVENOUS
  Administered 2021-03-23: 80 ug via INTRAVENOUS
  Administered 2021-03-23: 40 ug via INTRAVENOUS
  Administered 2021-03-23 (×2): 80 ug via INTRAVENOUS
  Administered 2021-03-23: 120 ug via INTRAVENOUS
  Administered 2021-03-23: 40 ug via INTRAVENOUS

## 2021-03-23 MED ORDER — VANCOMYCIN HCL 1000 MG IV SOLR
INTRAVENOUS | Status: DC | PRN
  Administered 2021-03-23: 1000 mg via TOPICAL

## 2021-03-23 MED ORDER — PROPOFOL 10 MG/ML IV BOLUS
INTRAVENOUS | Status: AC
  Filled 2021-03-23: qty 20

## 2021-03-23 MED ORDER — PHENYLEPHRINE 40 MCG/ML (10ML) SYRINGE FOR IV PUSH (FOR BLOOD PRESSURE SUPPORT)
PREFILLED_SYRINGE | INTRAVENOUS | Status: AC
  Filled 2021-03-23: qty 10

## 2021-03-23 MED ORDER — MORPHINE SULFATE (PF) 2 MG/ML IV SOLN
0.5000 mg | INTRAVENOUS | Status: DC | PRN
  Administered 2021-03-26 – 2021-03-27 (×2): 0.5 mg via INTRAVENOUS
  Filled 2021-03-23 (×2): qty 1

## 2021-03-23 MED ORDER — DEXAMETHASONE SODIUM PHOSPHATE 10 MG/ML IJ SOLN
INTRAMUSCULAR | Status: AC
  Filled 2021-03-23: qty 1

## 2021-03-23 MED ORDER — LIDOCAINE 2% (20 MG/ML) 5 ML SYRINGE
INTRAMUSCULAR | Status: AC
  Filled 2021-03-23: qty 5

## 2021-03-23 MED ORDER — DOCUSATE SODIUM 100 MG PO CAPS
100.0000 mg | ORAL_CAPSULE | Freq: Two times a day (BID) | ORAL | Status: DC
  Administered 2021-03-23 – 2021-03-27 (×8): 100 mg via ORAL
  Filled 2021-03-23 (×9): qty 1

## 2021-03-23 MED ORDER — ONDANSETRON HCL 4 MG/2ML IJ SOLN
INTRAMUSCULAR | Status: AC
  Filled 2021-03-23: qty 2

## 2021-03-23 MED ORDER — CEFAZOLIN SODIUM-DEXTROSE 2-4 GM/100ML-% IV SOLN
INTRAVENOUS | Status: AC
  Filled 2021-03-23: qty 100

## 2021-03-23 MED ORDER — SUGAMMADEX SODIUM 200 MG/2ML IV SOLN
INTRAVENOUS | Status: DC | PRN
  Administered 2021-03-23 (×2): 100 mg via INTRAVENOUS

## 2021-03-23 MED ORDER — 0.9 % SODIUM CHLORIDE (POUR BTL) OPTIME
TOPICAL | Status: DC | PRN
  Administered 2021-03-23: 1000 mL

## 2021-03-23 MED ORDER — ACETAMINOPHEN 10 MG/ML IV SOLN
INTRAVENOUS | Status: DC | PRN
  Administered 2021-03-23: 1000 mg via INTRAVENOUS

## 2021-03-23 MED ORDER — ACETAMINOPHEN 325 MG PO TABS
325.0000 mg | ORAL_TABLET | Freq: Four times a day (QID) | ORAL | Status: DC | PRN
  Administered 2021-03-24 – 2021-03-27 (×2): 650 mg via ORAL
  Filled 2021-03-23 (×2): qty 2

## 2021-03-23 MED ORDER — SODIUM CHLORIDE 0.9 % IR SOLN
Status: DC | PRN
  Administered 2021-03-23: 3000 mL

## 2021-03-23 MED ORDER — BUPIVACAINE LIPOSOME 1.3 % IJ SUSP
INTRAMUSCULAR | Status: AC
  Filled 2021-03-23: qty 20

## 2021-03-23 MED ORDER — ONDANSETRON HCL 4 MG/2ML IJ SOLN
INTRAMUSCULAR | Status: DC | PRN
  Administered 2021-03-23: 4 mg via INTRAVENOUS

## 2021-03-23 MED ORDER — TRANEXAMIC ACID-NACL 1000-0.7 MG/100ML-% IV SOLN
1000.0000 mg | Freq: Once | INTRAVENOUS | Status: AC
  Administered 2021-03-23: 1000 mg via INTRAVENOUS
  Filled 2021-03-23: qty 100

## 2021-03-23 MED ORDER — SODIUM CHLORIDE (PF) 0.9 % IJ SOLN
INTRAMUSCULAR | Status: DC | PRN
  Administered 2021-03-23: 55 mL

## 2021-03-23 MED ORDER — DEXAMETHASONE SODIUM PHOSPHATE 10 MG/ML IJ SOLN
INTRAMUSCULAR | Status: DC | PRN
  Administered 2021-03-23: 5 mg via INTRAVENOUS

## 2021-03-23 MED ORDER — CLONIDINE HCL (ANALGESIA) 100 MCG/ML EP SOLN
EPIDURAL | Status: AC
  Filled 2021-03-23: qty 10

## 2021-03-23 MED ORDER — TRANEXAMIC ACID-NACL 1000-0.7 MG/100ML-% IV SOLN
INTRAVENOUS | Status: AC
  Filled 2021-03-23: qty 100

## 2021-03-23 MED ORDER — CEFAZOLIN SODIUM-DEXTROSE 2-4 GM/100ML-% IV SOLN
2.0000 g | Freq: Once | INTRAVENOUS | Status: AC
  Administered 2021-03-23: 2 g via INTRAVENOUS
  Filled 2021-03-23: qty 100

## 2021-03-23 MED ORDER — MORPHINE SULFATE 4 MG/ML IJ SOLN
INTRAMUSCULAR | Status: DC | PRN
  Administered 2021-03-23: 20 mL

## 2021-03-23 MED ORDER — CEFAZOLIN SODIUM-DEXTROSE 2-4 GM/100ML-% IV SOLN
2.0000 g | INTRAVENOUS | Status: AC
  Administered 2021-03-23: 2 g via INTRAVENOUS

## 2021-03-23 MED ORDER — CHLORHEXIDINE GLUCONATE 4 % EX LIQD: 60.0000 mL | Freq: Once | CUTANEOUS | Status: DC

## 2021-03-23 MED ORDER — PROPOFOL 10 MG/ML IV BOLUS
INTRAVENOUS | Status: DC | PRN
  Administered 2021-03-23: 70 mg via INTRAVENOUS

## 2021-03-23 MED ORDER — CHLORHEXIDINE GLUCONATE 0.12 % MT SOLN
OROMUCOSAL | Status: AC
  Administered 2021-03-23: 15 mL
  Filled 2021-03-23: qty 15

## 2021-03-23 MED ORDER — POVIDONE-IODINE 10 % EX SWAB
2.0000 "application " | Freq: Once | CUTANEOUS | Status: AC
  Administered 2021-03-23: 2 via TOPICAL

## 2021-03-23 MED ORDER — MORPHINE SULFATE (PF) 4 MG/ML IV SOLN
INTRAVENOUS | Status: AC
  Filled 2021-03-23: qty 1

## 2021-03-23 MED ORDER — ACETAMINOPHEN 10 MG/ML IV SOLN
INTRAVENOUS | Status: AC
  Filled 2021-03-23: qty 100

## 2021-03-23 MED ORDER — MUPIROCIN CALCIUM 2 % EX CREA
TOPICAL_CREAM | Freq: Two times a day (BID) | CUTANEOUS | Status: DC
Start: 2021-03-23 — End: 2021-03-28
  Administered 2021-03-25: 1 via TOPICAL
  Filled 2021-03-23: qty 15

## 2021-03-23 MED ORDER — LACTATED RINGERS IV SOLN: INTRAVENOUS | Status: DC | PRN

## 2021-03-23 MED ORDER — ACETAMINOPHEN 500 MG PO TABS: 1000.0000 mg | ORAL_TABLET | Freq: Once | ORAL | Status: DC

## 2021-03-23 MED ORDER — MENTHOL 3 MG MT LOZG: 1.0000 | LOZENGE | OROMUCOSAL | Status: DC | PRN

## 2021-03-23 MED ORDER — FENTANYL CITRATE (PF) 100 MCG/2ML IJ SOLN
INTRAMUSCULAR | Status: AC
  Filled 2021-03-23: qty 2

## 2021-03-23 MED ORDER — METOCLOPRAMIDE HCL 5 MG PO TABS: 5.0000 mg | ORAL_TABLET | Freq: Three times a day (TID) | ORAL | Status: DC | PRN

## 2021-03-23 MED ORDER — FENTANYL CITRATE (PF) 250 MCG/5ML IJ SOLN
INTRAMUSCULAR | Status: DC | PRN
  Administered 2021-03-23: 50 ug via INTRAVENOUS
  Administered 2021-03-23 (×4): 25 ug via INTRAVENOUS

## 2021-03-23 MED ORDER — ONDANSETRON HCL 4 MG PO TABS
4.0000 mg | ORAL_TABLET | Freq: Four times a day (QID) | ORAL | Status: DC | PRN
Start: 2021-03-23 — End: 2021-03-28

## 2021-03-23 MED ORDER — PHENOL 1.4 % MT LIQD: 1.0000 | OROMUCOSAL | Status: DC | PRN

## 2021-03-23 MED ORDER — BUPIVACAINE HCL (PF) 0.5 % IJ SOLN
INTRAMUSCULAR | Status: AC
  Filled 2021-03-23: qty 30

## 2021-03-23 MED ORDER — SODIUM CHLORIDE 0.9 % IV SOLN: INTRAVENOUS | Status: AC

## 2021-03-23 MED ORDER — METOCLOPRAMIDE HCL 5 MG/ML IJ SOLN: 5.0000 mg | Freq: Three times a day (TID) | INTRAMUSCULAR | Status: DC | PRN

## 2021-03-23 MED ORDER — ROCURONIUM BROMIDE 10 MG/ML (PF) SYRINGE
PREFILLED_SYRINGE | INTRAVENOUS | Status: DC | PRN
Start: 2021-03-23 — End: 2021-03-23
  Administered 2021-03-23: 10 mg via INTRAVENOUS
  Administered 2021-03-23: 30 mg via INTRAVENOUS
  Administered 2021-03-23: 20 mg via INTRAVENOUS

## 2021-03-23 MED ORDER — FENTANYL CITRATE (PF) 100 MCG/2ML IJ SOLN
25.0000 ug | INTRAMUSCULAR | Status: DC | PRN
  Administered 2021-03-23 (×2): 25 ug via INTRAVENOUS

## 2021-03-23 MED ORDER — ACETAMINOPHEN 500 MG PO TABS
500.0000 mg | ORAL_TABLET | Freq: Four times a day (QID) | ORAL | Status: DC
  Administered 2021-03-23 – 2021-03-24 (×3): 500 mg via ORAL
  Filled 2021-03-23 (×4): qty 1

## 2021-03-23 MED ORDER — LIDOCAINE 2% (20 MG/ML) 5 ML SYRINGE
INTRAMUSCULAR | Status: DC | PRN
  Administered 2021-03-23: 60 mg via INTRAVENOUS

## 2021-03-23 MED ORDER — ONDANSETRON HCL 4 MG/2ML IJ SOLN: 4.0000 mg | Freq: Four times a day (QID) | INTRAMUSCULAR | Status: DC | PRN

## 2021-03-23 MED ORDER — SODIUM CHLORIDE 0.9% IV SOLUTION: Freq: Once | INTRAVENOUS | Status: DC

## 2021-03-23 MED ORDER — HYDROCODONE-ACETAMINOPHEN 5-325 MG PO TABS
1.0000 | ORAL_TABLET | ORAL | Status: DC | PRN
  Administered 2021-03-24 – 2021-03-26 (×7): 2 via ORAL
  Administered 2021-03-26: 1 via ORAL
  Administered 2021-03-26 – 2021-03-27 (×3): 2 via ORAL
  Administered 2021-03-27: 15:00:00 1 via ORAL
  Filled 2021-03-23 (×3): qty 2
  Filled 2021-03-23 (×2): qty 1
  Filled 2021-03-23 (×7): qty 2

## 2021-03-23 MED ORDER — PHENYLEPHRINE HCL-NACL 20-0.9 MG/250ML-% IV SOLN
INTRAVENOUS | Status: DC | PRN
  Administered 2021-03-23: 35 ug/min via INTRAVENOUS

## 2021-03-23 MED ORDER — FENTANYL CITRATE (PF) 250 MCG/5ML IJ SOLN
INTRAMUSCULAR | Status: AC
  Filled 2021-03-23: qty 5

## 2021-03-23 MED ORDER — VANCOMYCIN HCL 1000 MG IV SOLR
INTRAVENOUS | Status: AC
  Filled 2021-03-23: qty 20

## 2021-03-23 MED ORDER — TRANEXAMIC ACID-NACL 1000-0.7 MG/100ML-% IV SOLN
1000.0000 mg | INTRAVENOUS | Status: AC
  Administered 2021-03-23: 1000 mg via INTRAVENOUS

## 2021-03-23 SURGICAL SUPPLY — 93 items
APL SKNCLS STERI-STRIP NONHPOA (GAUZE/BANDAGES/DRESSINGS) ×1
BAG COUNTER SPONGE SURGICOUNT (BAG) ×2 IMPLANT
BAG SPNG CNTER NS LX DISP (BAG) ×1
BENZOIN TINCTURE PRP APPL 2/3 (GAUZE/BANDAGES/DRESSINGS) ×1 IMPLANT
BIPOLAR PROS AML 44 (Hips) ×2 IMPLANT
BLADE CLIPPER SURG (BLADE) ×1 IMPLANT
BLADE SAW RECIP 87.9 MT (BLADE) IMPLANT
BLADE SAW SAG 73X25 THK (BLADE) ×1
BLADE SAW SGTL 73X25 THK (BLADE) IMPLANT
BRUSH FEMORAL CANAL (MISCELLANEOUS) IMPLANT
COVER BACK TABLE 24X17X13 BIG (DRAPES) IMPLANT
DRAPE HALF SHEET 40X57 (DRAPES) IMPLANT
DRAPE IMP U-DRAPE 54X76 (DRAPES) ×2 IMPLANT
DRAPE INCISE IOBAN 66X45 STRL (DRAPES) IMPLANT
DRAPE ORTHO SPLIT 77X108 STRL (DRAPES) ×6
DRAPE STERI IOBAN 125X83 (DRAPES) ×1 IMPLANT
DRAPE SURG 17X23 STRL (DRAPES) IMPLANT
DRAPE SURG ORHT 6 SPLT 77X108 (DRAPES) ×2 IMPLANT
DRAPE U-SHAPE 47X51 STRL (DRAPES) ×2 IMPLANT
DRESSING AQUACEL AG SP 3.5X10 (GAUZE/BANDAGES/DRESSINGS) IMPLANT
DRILL BIT 1/8DIAX5INL DISPOSE (BIT) ×2 IMPLANT
DRSG AQUACEL AG ADV 3.5X 6 (GAUZE/BANDAGES/DRESSINGS) ×1 IMPLANT
DRSG AQUACEL AG SP 3.5X10 (GAUZE/BANDAGES/DRESSINGS) ×2
DRSG PAD ABDOMINAL 8X10 ST (GAUZE/BANDAGES/DRESSINGS) ×2 IMPLANT
DURAPREP 26ML APPLICATOR (WOUND CARE) ×2 IMPLANT
ELECT BLADE 4.0 EZ CLEAN MEGAD (MISCELLANEOUS) ×2
ELECT BLADE 6.5 EXT (BLADE) IMPLANT
ELECT CAUTERY BLADE 6.4 (BLADE) ×2 IMPLANT
ELECT REM PT RETURN 9FT ADLT (ELECTROSURGICAL) ×2
ELECTRODE BLDE 4.0 EZ CLN MEGD (MISCELLANEOUS) IMPLANT
ELECTRODE REM PT RTRN 9FT ADLT (ELECTROSURGICAL) ×1 IMPLANT
EVACUATOR 1/8 PVC DRAIN (DRAIN) IMPLANT
FACESHIELD WRAPAROUND (MASK) IMPLANT
FACESHIELD WRAPAROUND OR TEAM (MASK) ×1 IMPLANT
GAUZE SPONGE 4X4 12PLY STRL (GAUZE/BANDAGES/DRESSINGS) ×1 IMPLANT
GAUZE XEROFORM 5X9 LF (GAUZE/BANDAGES/DRESSINGS) ×1 IMPLANT
GLOVE SRG 8 PF TXTR STRL LF DI (GLOVE) ×1 IMPLANT
GLOVE SURG ENC MOIS LTX SZ9 (GLOVE) ×2 IMPLANT
GLOVE SURG LTX SZ8 (GLOVE) ×2 IMPLANT
GLOVE SURG UNDER POLY LF SZ8 (GLOVE) ×2
GOWN STRL REUS W/ TWL LRG LVL3 (GOWN DISPOSABLE) ×1 IMPLANT
GOWN STRL REUS W/ TWL XL LVL3 (GOWN DISPOSABLE) ×1 IMPLANT
GOWN STRL REUS W/TWL LRG LVL3 (GOWN DISPOSABLE) ×2
GOWN STRL REUS W/TWL XL LVL3 (GOWN DISPOSABLE) ×2
HANDPIECE INTERPULSE COAX TIP (DISPOSABLE) ×2
HEAD BIPOLAR PROS AML 44 (Hips) IMPLANT
HEAD FEM STD 28X+1.5 STRL (Hips) ×1 IMPLANT
HOOD PEEL AWAY FACE SHEILD DIS (HOOD) ×2 IMPLANT
HOOD PEEL AWAY FLYTE STAYCOOL (MISCELLANEOUS) ×3 IMPLANT
IMMOBILIZER KNEE 20 (SOFTGOODS) IMPLANT
IMMOBILIZER KNEE 20 THIGH 36 (SOFTGOODS) IMPLANT
IMMOBILIZER KNEE 22 UNIV (SOFTGOODS) IMPLANT
IMMOBILIZER KNEE 24 THIGH 36 (MISCELLANEOUS) IMPLANT
IMMOBILIZER KNEE 24 UNIV (MISCELLANEOUS) IMPLANT
KIT BASIN OR (CUSTOM PROCEDURE TRAY) ×2 IMPLANT
KIT TURNOVER KIT B (KITS) ×2 IMPLANT
MANIFOLD NEPTUNE II (INSTRUMENTS) ×2 IMPLANT
NDL HYPO 25GX1X1/2 BEV (NEEDLE) IMPLANT
NDL SPNL 22GX3.5 QUINCKE BK (NEEDLE) IMPLANT
NDL SUT .5 MAYO 1.404X.05X (NEEDLE) ×1 IMPLANT
NEEDLE HYPO 25GX1X1/2 BEV (NEEDLE) IMPLANT
NEEDLE MAYO TAPER (NEEDLE) ×2
NEEDLE SPNL 22GX3.5 QUINCKE BK (NEEDLE) ×2 IMPLANT
NS IRRIG 1000ML POUR BTL (IV SOLUTION) ×2 IMPLANT
PACK TOTAL JOINT (CUSTOM PROCEDURE TRAY) ×2 IMPLANT
PACK UNIVERSAL I (CUSTOM PROCEDURE TRAY) ×2 IMPLANT
PAD ARMBOARD 7.5X6 YLW CONV (MISCELLANEOUS) ×4 IMPLANT
PASSER SUT SWANSON 36MM LOOP (INSTRUMENTS) ×1 IMPLANT
PILLOW ABDUCTION MEDIUM (MISCELLANEOUS) IMPLANT
PIN STEINMAN 3/16 (PIN) IMPLANT
SET HNDPC FAN SPRY TIP SCT (DISPOSABLE) IMPLANT
SPONGE T-LAP 18X18 ~~LOC~~+RFID (SPONGE) ×3 IMPLANT
SPONGE T-LAP 4X18 ~~LOC~~+RFID (SPONGE) IMPLANT
STAPLER VISISTAT 35W (STAPLE) ×2 IMPLANT
STEM FEM ACTIS STD SZ4 (Stem) ×1 IMPLANT
STRIP CLOSURE SKIN 1/2X4 (GAUZE/BANDAGES/DRESSINGS) ×1 IMPLANT
SUCTION FRAZIER HANDLE 10FR (MISCELLANEOUS) ×2
SUCTION TUBE FRAZIER 10FR DISP (MISCELLANEOUS) ×1 IMPLANT
SUT ETHIBOND NAB CT1 #1 30IN (SUTURE) ×8 IMPLANT
SUT MNCRL AB 3-0 PS2 27 (SUTURE) ×1 IMPLANT
SUT VIC AB 0 CT1 27 (SUTURE) ×8
SUT VIC AB 0 CT1 27XBRD ANBCTR (SUTURE) ×2 IMPLANT
SUT VIC AB 1 CT1 27 (SUTURE) ×18
SUT VIC AB 1 CT1 27XBRD ANBCTR (SUTURE) ×2 IMPLANT
SUT VIC AB 2-0 CT1 27 (SUTURE) ×10
SUT VIC AB 2-0 CT1 TAPERPNT 27 (SUTURE) ×4 IMPLANT
SYR 20ML LL LF (SYRINGE) ×1 IMPLANT
SYR CONTROL 10ML LL (SYRINGE) IMPLANT
TOWEL GREEN STERILE (TOWEL DISPOSABLE) ×2 IMPLANT
TOWEL GREEN STERILE FF (TOWEL DISPOSABLE) ×2 IMPLANT
TOWER CARTRIDGE SMART MIX (DISPOSABLE) IMPLANT
TRAY FOLEY MTR SLVR 16FR STAT (SET/KITS/TRAYS/PACK) ×1 IMPLANT
WATER STERILE IRR 1000ML POUR (IV SOLUTION) ×8 IMPLANT

## 2021-03-23 NOTE — Consult Note (Signed)
WOC Nurse Consult Note: ?Reason for Consult:umbilicus with erythema, small amount exudate. Family reports this is a chronic problem, periodic, not consistent ?Wound type:Likely moisture related ?Pressure Injury POA: N/A ?Measurement:N/A ?Wound bed:N/A ?Drainage (amount, consistency, odor) scant serous, dried serum on skin ?Periwound:mild erythema ?Dressing procedure/placement/frequency: I will provide Nursing with conservative care guidance using a soap and water cleanse, drying and topical care plan consisting of mupirocin cream applied twice daily for 7 days. No dressing. ?If this persists and is bothersome, consider dermatology consult post discharge from acute care. ? ?[place] nursing team will not follow, but will remain available to this patient, the nursing and medical teams.  Please re-consult if needed. ?Thanks, ?Maudie Flakes, MSN, RN, [place], [place], CWON-AP, [place]  ?Pager# [phone]  ? ? ? ? ? ?  ?

## 2021-03-23 NOTE — Anesthesia Postprocedure Evaluation (Signed)
Anesthesia Post Note ? ?Patient: Julie Martinez ? ?Procedure(s) Performed: ARTHROPLASTY BIPOLAR HIP (HEMIARTHROPLASTY) (Left: Hip) ? ?  ? ?Patient location during evaluation: PACU ?Anesthesia Type: General ?Level of consciousness: awake and alert ?Pain management: pain level controlled ?Vital Signs Assessment: post-procedure vital signs reviewed and stable ?Respiratory status: spontaneous breathing, nonlabored ventilation, respiratory function stable and patient connected to nasal cannula oxygen ?Cardiovascular status: blood pressure returned to baseline and stable ?Postop Assessment: no apparent nausea or vomiting ?Anesthetic complications: no ? ? ?No notable events documented. ? ?Last Vitals:  ?Vitals:  ? 03/23/21 1140 03/23/21 1220  ?BP: (!) 123/50 (!) 120/50  ?Pulse: 65 65  ?Resp: 12 18  ?Temp:  36.4 ?C  ?SpO2: 98% 100%  ?  ?Last Pain:  ?Vitals:  ? 03/23/21 1240  ?TempSrc:   ?PainSc: Asleep  ? ? ?  ?  ?  ?  ?  ?  ? ?Suzette Battiest E ? ? ? ? ?

## 2021-03-23 NOTE — Anesthesia Procedure Notes (Signed)
Procedure Name: Intubation ?Date/Time: 03/23/2021 7:52 AM ?Performed by: Janene Harvey, CRNA ?Pre-anesthesia Checklist: Patient identified, Emergency Drugs available, Suction available and Patient being monitored ?Patient Re-evaluated:Patient Re-evaluated prior to induction ?Oxygen Delivery Method: Circle system utilized ?Preoxygenation: Pre-oxygenation with 100% oxygen ?Induction Type: IV induction ?Ventilation: Mask ventilation without difficulty ?Laryngoscope Size: Mac and 4 ?Grade View: Grade I ?Tube type: Oral ?Tube size: 7.0 mm ?Number of attempts: 1 ?Airway Equipment and Method: Stylet and Oral airway ?Placement Confirmation: ETT inserted through vocal cords under direct vision, positive ETCO2 and breath sounds checked- equal and bilateral ?Secured at: 21 cm ?Tube secured with: Tape ?Dental Injury: Teeth and Oropharynx as per pre-operative assessment  ? ? ? ? ?

## 2021-03-23 NOTE — Plan of Care (Signed)
  Problem: Clinical Measurements: Goal: Will remain free from infection Outcome: Progressing   Problem: Clinical Measurements: Goal: Diagnostic test results will improve Outcome: Progressing   Problem: Clinical Measurements: Goal: Respiratory complications will improve Outcome: Progressing   

## 2021-03-23 NOTE — Progress Notes (Signed)
OT Cancellation Note ? ?Patient Details ?Name: MARIAEDUARDA DEFRANCO ?MRN: [id number] ?DOB: 31-Jul-1931 ? ? ?Cancelled Treatment:    Reason Eval/Treat Not Completed: Patient at procedure or test/ unavailable;Patient not medically ready. Pt off the floor for L hip hemiarthroplasty. OT will follow up tomorrow for evaluation.  ? ?Jesusmanuel Erbes H., OTR/L ?Acute Rehabilitation ? ?Verdis Bassette Elane Yolanda Bonine ?03/23/2021, 8:44 AM ?

## 2021-03-23 NOTE — Progress Notes (Signed)
PHARMACY NOTE:  ANTIMICROBIAL RENAL DOSAGE ADJUSTMENT ? ?Current antimicrobial regimen includes a mismatch between antimicrobial dosage and estimated renal function.  As per policy approved by the Pharmacy & Therapeutics and Medical Executive Committees, the antimicrobial dosage will be adjusted accordingly. ? ?Current antimicrobial dosage:  Cefazolin 2g IV q8 x3 ? ?Indication: Post op prophylaxis ? ?Renal Function: ? ?Estimated Creatinine Clearance: 18.8 mL/min (A) (by C-G formula based on SCr of 1.66 mg/dL (H)). ?'[]'$      On intermittent HD, scheduled: ?'[]'$      On CRRT ?   ?Antimicrobial dosage has been changed to:  Cefazolin 2g IV x1 12 hr post op ? ?Additional comments: ? ? ? ?  ? ?

## 2021-03-23 NOTE — Progress Notes (Signed)
PROGRESS NOTE  Julie Martinez:[id number] DOB: [birth year] DOA: 03/21/2021 PCP: Cari Caraway, MD  HPI/Recap of past 24 hours: Julie Martinez is a 88 y.o. female with medical history significant for advanced dementia, DVT/PE on Eliquis, HTN, who presented from home after a fall.  Patient lives at home. History is obtained from her daughter who is also her medical POA via phone.  Per her daughter pt got out of bed took a few steps then fell. No LOC. She was in her usual state of health prior to this. In ED, VS fairly stable, lab studies remarkable for creatinine 1.43, baseline 0.9. Left hip x-ray shows subcapital hip fracture.  Orthopedic surgery, Dr. Sammuel Hines consulted. Patient is on Eliquis, allow for washout with surgical date 03/23/2021.  TRH, hospitalist service, was asked to admit.     Today, saw pt after surgery, denies any new complaints, looks comfortable.     Assessment/Plan: Principal Problem:   Closed left hip fracture (HCC)   Left hip fracture on Eliquis post fall, POA Left pelvic x-ray showing pathologic subcapital left femoral neck fracture with mild varus angulation. Orthopedic surgery consulted, s/p left hip hemiarthroplasty on 03/23/2021 DVT PPx, pain management as per Ortho PT/OT when ok by ortho   History of DVT/PE on Eliquis Eliquis on hold (ortho to determine when to re-start) DVT and PE were diagnosed 2 years ago BLE Doppler neg for DVT Continue to hold off home Eliquis   AKI on CKD 3a, suspect prerenal in the setting of poor oral intake. Baseline creatinine appears to be 0.9- 1, Cr 1.4 on admission Continue gentle IVF Monitor urine output with strict I's and O's.   Essential hypertension BP better control, likely worsened by pain Continue recently started Norvasc, IV hydralazine prn Continue to hold home quinapril and HCTZ due to AKI  Umbilical erythema Chronic problem Noted some redness, tenderness, minimal exudate within umbilicus WOC consulted,  appreciate care   Advanced dementia Reorient as needed Delirium/aspiration/fall precautions PT OT to assess postsurgery with orthopedic surgery's guidance      Estimated body mass index is 25.4 kg/m as calculated from the following:   Height as of this encounter: 5' (1.524 m).   Weight as of this encounter: 59 kg.     Code Status: Full  Family Communication:   Disposition Plan: Status is: Inpatient Remains inpatient appropriate because: Level of care    Consultants: Orthopedics  Procedures: Left hip hemiarthroplasty  Antimicrobials: None  DVT prophylaxis: SCD   Objective: Vitals:   03/23/21 1125 03/23/21 1140 03/23/21 1220 03/23/21 1423  BP: (!) 111/48 (!) 123/50 (!) 120/50 (!) 109/52  Pulse: 68 65 65 69  Resp: '20 12 18 18  '$ Temp:   97.6 F (36.4 C) 97.6 F (36.4 C)  TempSrc:   Oral Oral  SpO2: 100% 98% 100% 99%  Weight:      Height:        Intake/Output Summary (Last 24 hours) at 03/23/2021 1825 Last data filed at 03/23/2021 1722 Gross per 24 hour  Intake 1370 ml  Output 810 ml  Net 560 ml   Filed Weights   03/21/21 0325 03/21/21 2236  Weight: 54.4 kg 59 kg    Exam: General: NAD  Cardiovascular: S1, S2 present Respiratory: CTAB Abdomen: Soft, nontender, nondistended, bowel sounds present, some redness, tenderness, minimal exudate within umbilicus Musculoskeletal: No bilateral pedal edema noted, left hip dressing C/D/I Skin: As noted above, multiple bruises noted in extremities Psychiatry: Unable to assess  Data Reviewed: CBC: Recent Labs  Lab 03/21/21 0322 03/22/21 0210 03/23/21 0141  WBC 8.9 10.1 9.3  NEUTROABS 5.6  --  6.4  HGB 11.6* 11.1* 10.0*  HCT 36.9 35.0* 30.9*  MCV 101.9* 101.4* 102.3*  PLT 293 241 478   Basic Metabolic Panel: Recent Labs  Lab 03/21/21 0322 03/22/21 0210 03/23/21 0141  NA 143 140 140  K 3.9 4.5 4.5  CL 110 110 108  CO2 '22 22 24  '$ GLUCOSE 131* 128* 100*  BUN 30* 32* 41*  CREATININE 1.40* 1.36*  1.66*  CALCIUM 8.8* 8.8* 8.6*  MG  --  2.0  --   PHOS  --  3.4  --    GFR: Estimated Creatinine Clearance: 18.8 mL/min (A) (by C-G formula based on SCr of 1.66 mg/dL (H)). Liver Function Tests: Recent Labs  Lab 03/21/21 0322 03/22/21 0210  AST 18  --   ALT 14  --   ALKPHOS 71  --   BILITOT 0.4  --   PROT 6.4*  --   ALBUMIN 3.4* 2.9*   No results for input(s): LIPASE, AMYLASE in the last 168 hours. No results for input(s): AMMONIA in the last 168 hours. Coagulation Profile: No results for input(s): INR, PROTIME in the last 168 hours. Cardiac Enzymes: No results for input(s): CKTOTAL, CKMB, CKMBINDEX, TROPONINI in the last 168 hours. BNP (last 3 results) No results for input(s): PROBNP in the last 8760 hours. HbA1C: No results for input(s): HGBA1C in the last 72 hours. CBG: No results for input(s): GLUCAP in the last 168 hours. Lipid Profile: No results for input(s): CHOL, HDL, LDLCALC, TRIG, CHOLHDL, LDLDIRECT in the last 72 hours. Thyroid Function Tests: No results for input(s): TSH, T4TOTAL, FREET4, T3FREE, THYROIDAB in the last 72 hours. Anemia Panel: No results for input(s): VITAMINB12, FOLATE, FERRITIN, TIBC, IRON, RETICCTPCT in the last 72 hours. Urine analysis:    Component Value Date/Time   COLORURINE YELLOW 03/22/2021 0130   APPEARANCEUR CLEAR 03/22/2021 0130   LABSPEC 1.017 03/22/2021 0130   PHURINE 5.0 03/22/2021 0130   GLUCOSEU NEGATIVE 03/22/2021 0130   HGBUR NEGATIVE 03/22/2021 0130   BILIRUBINUR NEGATIVE 03/22/2021 0130   KETONESUR NEGATIVE 03/22/2021 0130   PROTEINUR NEGATIVE 03/22/2021 0130   UROBILINOGEN 1.0 11/12/2014 1525   NITRITE NEGATIVE 03/22/2021 0130   LEUKOCYTESUR TRACE (A) 03/22/2021 0130   Sepsis Labs: '@LABRCNTIP'$ (procalcitonin:4,lacticidven:4)  ) Recent Results (from the past 240 hour(s))  Resp Panel by RT-PCR (Flu A&B, Covid) Nasopharyngeal Swab     Status: None   Collection Time: 03/21/21  4:26 AM   Specimen: Nasopharyngeal  Swab; Nasopharyngeal(NP) swabs in vial transport medium  Result Value Ref Range Status   SARS Coronavirus 2 by RT PCR NEGATIVE NEGATIVE Final    Comment: (NOTE) SARS-CoV-2 target nucleic acids are NOT DETECTED.  The SARS-CoV-2 RNA is generally detectable in upper respiratory specimens during the acute phase of infection. The lowest concentration of SARS-CoV-2 viral copies this assay can detect is 138 copies/mL. A negative result does not preclude SARS-Cov-2 infection and should not be used as the sole basis for treatment or other patient management decisions. A negative result may occur with  improper specimen collection/handling, submission of specimen other than nasopharyngeal swab, presence of viral mutation(s) within the areas targeted by this assay, and inadequate number of viral copies(<138 copies/mL). A negative result must be combined with clinical observations, patient history, and epidemiological information. The expected result is Negative.  Fact Sheet for Patients:  EntrepreneurPulse.com.au  Fact Sheet  for Healthcare Providers:  IncredibleEmployment.be  This test is no t yet approved or cleared by the [place] and  has been authorized for detection and/or diagnosis of SARS-CoV-2 by FDA under an Emergency Use Authorization (EUA). This EUA will remain  in effect (meaning this test can be used) for the duration of the COVID-19 declaration under Section 564(b)(1) of the Act, 21 U.S.C.section 360bbb-3(b)(1), unless the authorization is terminated  or revoked sooner.       Influenza A by PCR NEGATIVE NEGATIVE Final   Influenza B by PCR NEGATIVE NEGATIVE Final    Comment: (NOTE) The Xpert Xpress SARS-CoV-2/FLU/RSV plus assay is intended as an aid in the diagnosis of influenza from Nasopharyngeal swab specimens and should not be used as a sole basis for treatment. Nasal washings and aspirates are unacceptable for Xpert Xpress  SARS-CoV-2/FLU/RSV testing.  Fact Sheet for Patients: EntrepreneurPulse.com.au  Fact Sheet for Healthcare Providers: IncredibleEmployment.be  This test is not yet approved or cleared by the [place] FDA and has been authorized for detection and/or diagnosis of SARS-CoV-2 by FDA under an Emergency Use Authorization (EUA). This EUA will remain in effect (meaning this test can be used) for the duration of the COVID-19 declaration under Section 564(b)(1) of the Act, 21 U.S.C. section 360bbb-3(b)(1), unless the authorization is terminated or revoked.  Performed at [facility], [address], [place] [address]   Surgical pcr screen     Status: Abnormal   Collection Time: 03/23/21  6:58 AM   Specimen: Nasal Mucosa; Nasal Swab  Result Value Ref Range Status   MRSA, PCR NEGATIVE NEGATIVE Final   Staphylococcus aureus POSITIVE (A) NEGATIVE Final    Comment: (NOTE) The Xpert SA Assay (FDA approved for NASAL specimens in patients 58 years of age and older), is one component of a comprehensive surveillance program. It is not intended to diagnose infection nor to guide or monitor treatment. Performed at [facility], [place] [address], [place], [place] [address]       Studies: Pelvis Portable  Result Date: 03/23/2021 CLINICAL DATA:  Status post hip arthroplasty EXAM: PORTABLE PELVIS 1-2 VIEWS COMPARISON:  March 21, 2021 FINDINGS: Status post LEFT hip arthroplasty. Orthopedic hardware is intact and without periprosthetic fracture or lucency. Soft tissue air consistent with recent surgical intervention. Pelvic phleboliths. Osteopenia. Degenerative changes of the lower lumbar spine. IMPRESSION: Expected postsurgical changes status post LEFT hip arthroplasty. Electronically Signed   By: Valentino Saxon M.D.   On: 03/23/2021 12:11   DG C-Arm 1-60 Min-No Report  Result Date: 03/23/2021 Fluoroscopy was utilized by the  requesting physician.  No radiographic interpretation.   DG C-Arm 1-60 Min-No Report  Result Date: 03/23/2021 Fluoroscopy was utilized by the requesting physician.  No radiographic interpretation.   DG HIP UNILAT WITH PELVIS 1V LEFT  Result Date: 03/23/2021 CLINICAL DATA:  Left hip arthroplasty EXAM: DG HIP (WITH OR WITHOUT PELVIS) 1V*L* COMPARISON:  None. FINDINGS: Multiple intraoperative fluoroscopic spot images are provided. Interval left total hip arthroplasty. Normal alignment. FLUOROSCOPY TIME:  Fluoroscopy Time:  12.4 seconds Radiation Exposure Index (if provided by the fluoroscopic device): 1.2 mGy Number of Acquired Spot Images: 0 IMPRESSION: 1. Interval left total hip arthroplasty. Electronically Signed   By: Kathreen Devoid M.D.   On: 03/23/2021 10:25    Scheduled Meds:  sodium chloride   Intravenous Once   acetaminophen  500 mg Oral Q6H   amLODipine  5 mg Oral Daily   docusate sodium  100 mg Oral BID   fentaNYL       folic acid  1 mg Oral Daily   melatonin  3 mg Oral QHS   mupirocin cream   Topical BID   vitamin B-12  250 mcg Oral Daily    Continuous Infusions:  sodium chloride 75 mL/hr at 03/23/21 1332    ceFAZolin (ANCEF) IV     lactated ringers 50 mL/hr at 03/22/21 0211     LOS: 2 days     Alma Friendly, MD [facility]  If 7PM-7AM, please contact night-coverage [email or web address] 03/23/2021, 6:25 PM

## 2021-03-23 NOTE — Anesthesia Preprocedure Evaluation (Addendum)
Anesthesia Evaluation  ?Patient identified by MRN, date of birth, ID band ?Patient awake and Patient confused ? ? ? ?Reviewed: ?Allergy & Precautions, NPO status , Patient's Chart, lab work & pertinent test results ? ?Airway ?Mallampati: II ? ?TM Distance: >3 FB ?Neck ROM: Full ? ? ? Dental ? ?(+) Dental Advisory Given ?  ?Pulmonary ?neg pulmonary ROS,  ?  ?breath sounds clear to auscultation ? ? ? ? ? ? Cardiovascular ?hypertension, Pt. on medications ?+ DVT  ? ?Rhythm:Regular Rate:Normal ? ? ?  ?Neuro/Psych ? Neuromuscular disease   ? GI/Hepatic ?negative GI ROS, Neg liver ROS,   ?Endo/Other  ?negative endocrine ROS ? Renal/GU ?CRFRenal disease  ? ?  ?Musculoskeletal ? ? Abdominal ?  ?Peds ? Hematology ? ?(+) Blood dyscrasia, anemia ,   ?Anesthesia Other Findings ? ? Reproductive/Obstetrics ? ?  ? ? ? ? ? ? ? ? ? ? ? ? ? ?  ?  ? ? ? ? ? ? ? ?Anesthesia Physical ?Anesthesia Plan ? ?ASA: 3 ? ?Anesthesia Plan: General  ? ?Post-op Pain Management: Ofirmev IV (intra-op)*  ? ?Induction: Intravenous ? ?PONV Risk Score and Plan: 3 and Dexamethasone, Ondansetron and Treatment may vary due to age or medical condition ? ?Airway Management Planned: Oral ETT ? ?Additional Equipment: None ? ?Intra-op Plan:  ? ?Post-operative Plan: Extubation in OR ? ?Informed Consent: I have reviewed the patients History and Physical, chart, labs and discussed the procedure including the risks, benefits and alternatives for the proposed anesthesia with the patient or authorized representative who has indicated his/her understanding and acceptance.  ? ? ? ?Consent reviewed with POA ? ?Plan Discussed with:  ? ?Anesthesia Plan Comments: (Pt <72hours off elequis. Plan GA with ETT. Routine monitors.)  ? ? ? ? ? ?Anesthesia Quick Evaluation ? ?

## 2021-03-23 NOTE — Transfer of Care (Signed)
Immediate Anesthesia Transfer of Care Note ? ?Patient: [name] ? ?Procedure(s) Performed: ARTHROPLASTY BIPOLAR HIP (HEMIARTHROPLASTY) (Left: Hip) ? ?Patient Location: PACU ? ?Anesthesia Type:General ? ?Level of Consciousness: drowsy and patient cooperative ? ?Airway & Oxygen Therapy: Patient Spontanous Breathing and Patient connected to face mask oxygen ? ?Post-op Assessment: Report given to RN and Post -op Vital signs reviewed and stable ? ?Post vital signs: Reviewed and stable ? ?Last Vitals:  ?Vitals Value Taken Time  ?BP 141/62 03/23/21 1054  ?Temp    ?Pulse 78 03/23/21 1056  ?Resp 19 03/23/21 1056  ?SpO2 100 % 03/23/21 1056  ?Vitals shown include unvalidated device data. ? ?Last Pain:  ?Vitals:  ? 03/23/21 0635  ?TempSrc: Oral  ?PainSc:   ?   ? ?  ? ?Complications: No notable events documented. ?

## 2021-03-23 NOTE — Brief Op Note (Signed)
? ?  03/23/2021 ? ?10:54 AM ? ?PATIENT:  Julie Martinez  88 y.o. female ? ?PRE-OPERATIVE DIAGNOSIS:  Left Hip Fx ? ?POST-OPERATIVE DIAGNOSIS:  Left Hip Fx ? ?PROCEDURE:  Procedure(s): ?ARTHROPLASTY BIPOLAR HIP (HEMIARTHROPLASTY) ? ?SURGEON:  Surgeon(s): ?Marlou Sa, Tonna Corner, MD ? ?ASSISTANT: Magnant PA ? ?ANESTHESIA:   general ? ?EBL: 100 ml   ? ?Total I/O ?In: 1200 [I.V.:1000; IV Piggyback:200] ?Out: 810 [Urine:710; Blood:100] ? ?BLOOD ADMINISTERED: none ? ?DRAINS: none  ? ?LOCAL MEDICATIONS USED: Marcaine morphine clonidine Exparel ? ?SPECIMEN:  No Specimen ? ?COUNTS:  YES ? ?TOURNIQUET:  * No tourniquets in log * ? ?DICTATION: .Other Dictation: Dictation Number [id number] ? ?PLAN OF CARE: Admit to inpatient  ? ?PATIENT DISPOSITION:  PACU - hemodynamically stable ? ? ? ? ? ? ? ? ? ? ? ? ?  ?

## 2021-03-23 NOTE — Progress Notes (Signed)
Pt stable  ?For hemi today ?Risks and benefits discussed ?All ? answered ?

## 2021-03-23 NOTE — Op Note (Signed)
NAME: Julie Martinez, Julie A. ?MEDICAL RECORD NO: [id number] ?ACCOUNT NO: [id number] ?DATE OF BIRTH: 1931-08-15 ?FACILITY: [facility] ?LOCATION: [facility] ?PHYSICIAN: Yetta Barre. Marlou Sa, MD ? ?Operative Report  ? ?DATE OF PROCEDURE: 03/23/2021 ? ?PREOPERATIVE DIAGNOSIS:  Left hip femoral neck fracture. ? ?POSTOPERATIVE DIAGNOSIS:  Left hip femoral neck fracture. ? ?PROCEDURE:  Left hip hemiarthroplasty using DePuy components including 44 bipolar head, 28 mm with 1.5 mm neck and ACTIS size 4 standard stem. ? ?SURGEON:  Yetta Barre. Marlou Sa, MD. ? ?ASSISTANT:  Annie Main. ? ?INDICATIONS:  The patient is an 88 year old patient with left hip femoral neck fracture.  She is in an ambulator.  Presents for operative management after explanation of risks and benefits.  She has been off Eliquis for about 3 days at this time. ? ?DESCRIPTION OF PROCEDURE:  The patient was brought to the operating room where general anesthetic was induced.  Preoperative antibiotics administered.  Timeout was called.  TXA was administered.  The patient was placed on the Hana bed.  Left hip  ?prescrubbed with alcohol and Betadine, allowed to air dry, prepped with DuraPrep solution and draped in sterile manner.  Ioban wall drape was used to cover the operative field.  Incision was made 2 cm distal and posterior to the anterior superior iliac crest  ?and extended about 9 cm distally.  Skin and subcutaneous tissue was sharply divided.  Lateral femoral cutaneous nerve branches mobilized anteriorly and posteriorly.  The tensor fascia lata fascia was incised from the anterior superior iliac crest  ?distally.  The crossing circumflex vessels were meticulously ligated using electrocautery.  Two acetabular retractor was placed on the superior and inferior portion of the femoral neck.  Arthrotomy made and marked with a #1 Vicryl suture.  Medial and  ?lateral flaps were developed.  The retractors then placed within the flaps on the femoral neck.  The femoral neck cut was revised  from the fracture under fluoroscopic guidance.  Head was removed.  Head was sized on the back table.  Next, the femoral lift ? was placed.  The leg was then externally rotated, adducted and extended.  Traction was removed prior to doing that maneuver.  Next, the femur was broached up to a size 4 and hip planing was performed.  This was placed in the patient's native version.   ?Trial reduction performed with the 44 bipolar head with 28 head and +1.5 neck.  This gave a very stable reduction and equal leg lengths.  Leg was stable with external rotation to about 50 degrees and extension to 75 degrees.  Trial components were  ?removed.  Thorough irrigation performed with IrriSept type solution both after the incision and after the arthrotomy and at multiple times during the case.  Vancomycin powder placed within the femoral canal.  Next, true components placed with same  ?stability parameters and good stability achieved.  Bipolar head was placed and the femur was reduced. Very good stability was maintained.  The prosthetic had a good fill on the canal.  Next, the thorough irrigation was again performed. IrriSept type  ?solution and vancomycin powder was then placed.  Capsule closed using #1 Vicryl suture.  Dead space closed in the next layer and then the fascia lata was closed using #1 Vicryl suture followed by interrupted inverted 0 Vicryl suture, 2-0 Vicryl suture,  ?and 3-0 Monocryl.  The patient tolerated the procedure well without immediate complications.  Steri-Strips, Aquacel dressing applied.  Leg lengths approximately equal at the conclusion of the case.  The  patient tolerated the procedure well without  ?immediate complication.  Luke's assistance was required for opening, closing, retraction, mobilization of tissue.  His assistance was a medical necessity. ? ? ?PUS ?D: 03/23/2021 11:00:20 am T: 03/23/2021 2:00:00 pm  ?JOB: [id number]/ [id number]  ?

## 2021-03-23 NOTE — TOC CAGE-AID Note (Signed)
Transition of Care (TOC) - CAGE-AID Screening ? ? ?Patient Details  ?Name: Julie Martinez ?MRN: [id number] ?Date of Birth: 05-31-1931 ? ?Transition of Care (TOC) CM/SW Contact:    ?Precious Bard, RN ?Phone Number: ?03/23/2021, 8:05 PM ? ? ?Clinical Narrative: ?Patient unable to participate in screening, responds to name but has advanced dementia and is disoriented x3. ? ? ?CAGE-AID Screening: ?Substance Abuse Screening unable to be completed due to: : Patient unable to participate (patient with baseline advanced dementia) ? ?  ?  ?  ?  ?  ? ?  ? ?  ? ? ? ? ? ? ?

## 2021-03-24 ENCOUNTER — Encounter (HOSPITAL_COMMUNITY): Payer: Self-pay | Admitting: Orthopedic Surgery

## 2021-03-24 DIAGNOSIS — S72002A Fracture of unspecified part of neck of left femur, initial encounter for closed fracture: Secondary | ICD-10-CM | POA: Diagnosis not present

## 2021-03-24 DIAGNOSIS — I10 Essential (primary) hypertension: Secondary | ICD-10-CM | POA: Diagnosis not present

## 2021-03-24 LAB — CBC WITH DIFFERENTIAL/PLATELET
Abs Immature Granulocytes: 0.06 10*3/uL (ref 0.00–0.07)
Basophils Absolute: 0 10*3/uL (ref 0.0–0.1)
Basophils Relative: 0 %
Eosinophils Absolute: 0 10*3/uL (ref 0.0–0.5)
Eosinophils Relative: 0 %
HCT: 28.1 % — ABNORMAL LOW (ref 36.0–46.0)
Hemoglobin: 9.1 g/dL — ABNORMAL LOW (ref 12.0–15.0)
Immature Granulocytes: 1 %
Lymphocytes Relative: 6 %
Lymphs Abs: 0.7 10*3/uL (ref 0.7–4.0)
MCH: 32.7 pg (ref 26.0–34.0)
MCHC: 32.4 g/dL (ref 30.0–36.0)
MCV: 101.1 fL — ABNORMAL HIGH (ref 80.0–100.0)
Monocytes Absolute: 1.6 10*3/uL — ABNORMAL HIGH (ref 0.1–1.0)
Monocytes Relative: 12 %
Neutro Abs: 10.5 10*3/uL — ABNORMAL HIGH (ref 1.7–7.7)
Neutrophils Relative %: 81 %
Platelets: 207 10*3/uL (ref 150–400)
RBC: 2.78 MIL/uL — ABNORMAL LOW (ref 3.87–5.11)
RDW: 12.1 % (ref 11.5–15.5)
WBC: 12.9 10*3/uL — ABNORMAL HIGH (ref 4.0–10.5)
nRBC: 0 % (ref 0.0–0.2)

## 2021-03-24 LAB — BASIC METABOLIC PANEL
Anion gap: 11 (ref 5–15)
BUN: 46 mg/dL — ABNORMAL HIGH (ref 8–23)
CO2: 21 mmol/L — ABNORMAL LOW (ref 22–32)
Calcium: 8.1 mg/dL — ABNORMAL LOW (ref 8.9–10.3)
Chloride: 107 mmol/L (ref 98–111)
Creatinine, Ser: 1.82 mg/dL — ABNORMAL HIGH (ref 0.44–1.00)
GFR, Estimated: 26 mL/min — ABNORMAL LOW (ref >60)
Glucose, Bld: 116 mg/dL — ABNORMAL HIGH (ref 70–99)
Potassium: 4.7 mmol/L (ref 3.5–5.1)
Sodium: 139 mmol/L (ref 135–145)

## 2021-03-24 MED ORDER — APIXABAN 2.5 MG PO TABS
2.5000 mg | ORAL_TABLET | Freq: Two times a day (BID) | ORAL | Status: DC
  Administered 2021-03-24 – 2021-03-27 (×7): 2.5 mg via ORAL
  Filled 2021-03-24 (×7): qty 1

## 2021-03-24 MED ORDER — SODIUM CHLORIDE 0.9 % IV SOLN: INTRAVENOUS | Status: DC

## 2021-03-24 NOTE — Progress Notes (Addendum)
Pt is in Yellow MEWS. Vitals were taken when pt was agitated and having pain. Scheduled and PRN medications were given and HR is improving. Yellow MEWS Vital signs guidelines sign on the door Gershon Cull was notified  ? ? ? 03/24/21 2036  ?Assess: MEWS Score  ?Temp 98.6 ?F (37 ?C)  ?BP 126/73  ?Pulse Rate (!) 129  ?Resp 17  ?SpO2 95 %  ?O2 Device Room Air  ?Assess: MEWS Score  ?MEWS Temp 0  ?MEWS Systolic 0  ?MEWS Pulse 2  ?MEWS RR 0  ?MEWS LOC 0  ?MEWS Score 2  ?MEWS Score Color Yellow  ?Assess: if the MEWS score is Yellow or Red  ?Were vital signs taken at a resting state? No ?(Pt was agitated)  ?Focused Assessment Change from prior assessment (see assessment flowsheet)  ?Early Detection of Sepsis Score *See Row Information* Medium  ?MEWS guidelines implemented *See Row Information* No, previously yellow, continue vital signs every 4 hours  ?Treat  ?MEWS Interventions Administered scheduled meds/treatments;Administered prn meds/treatments  ?Pain Scale PAINAD  ?Pain Type Surgical pain  ?Pain Location Hip  ?Pain Orientation Left  ?Pain Frequency Intermittent  ?Pain Intervention(s) Medication (See eMAR)  ?Breathing 1  ?Negative Vocalization 2  ?Facial Expression 2  ?Body Language 2  ?Consolability 1  ?PAINAD Score 8  ?Complains of Agitation;Restless  ?Interventions Medication (see MAR)  ?Neuro symptoms relieved by Rest;Relaxation techniques (Comment)  ?Patients response to intervention Relief  ?Take Vital Signs  ?Increase Vital Sign Frequency   ?(q 4 hours)  ?Escalate  ?MEWS: Escalate Yellow: discuss with charge nurse/RN and consider discussing with provider and RRT  ?Notify: Charge Nurse/RN  ?Date Charge Nurse/RN Notified 03/24/21  ?Time Charge Nurse/RN Notified 2100  ?Notify: Provider  ?Provider Name/Title Gershon Cull NP  ?Date Provider Notified 03/24/21  ?Time Provider Notified 2130  ?Notification Type Page ?(Secure chat)  ?Notification Reason Other (Comment);Change in status ?(Yellow MEWS)  ?Provider  response No new orders  ?Date of Provider Response 03/24/21  ?Time of Provider Response 2219  ?Notify: Rapid Response  ?Name of Rapid Response RN Notified No need, NP was notified and pt is improving ?(No need, pt's HR is improving)  ?Document  ?Patient Outcome Stabilized after interventions  ? ? ?

## 2021-03-24 NOTE — Evaluation (Signed)
Physical Therapy Evaluation ?Patient Details ?Name: Julie Martinez ?MRN: [id number] ?DOB: Jul 11, 1931 ?Today's Date: 03/24/2021 ? ?History of Present Illness ? 88 yo female presents to [facility] on 3/3 with fall OOB, found by grandson, sustained L femoral neck fx. s/p L hip hemiarthroplasty on 3/5. PMH includes dementia, DVT/PE, HTN, osteopenia.  ?Clinical Impression ?  ?Pt presents with restlessness and impaired cognition with history of dementia, severe post-op L hip pain, impaired balance with history of falls, and decreased activity tolerance vs baseline. Pt to benefit from acute PT to address deficits. Pt requiring max +1-2 for moving to/from EOB, very increased time and pt vocalizing in pain throughout mobility. PT feels pt will need ST-SNF to recover mobility-wise, if pt and family decide pt wants to go home will need to have 24/7 support and will need to be transfer-level. PT to progress mobility as tolerated, and will continue to follow acutely.  ?   ?   ? ?Recommendations for follow up therapy are one component of a multi-disciplinary discharge planning process, led by the attending physician.  Recommendations may be updated based on patient status, additional functional criteria and insurance authorization. ? ?Follow Up Recommendations Skilled nursing-short term rehab (<3 hours/day) ? ?  ?Assistance Recommended at Discharge Frequent or constant Supervision/Assistance  ?Patient can return home with the following ? A lot of help with bathing/dressing/bathroom;Two people to help with walking and/or transfers;Direct supervision/assist for medications management;Direct supervision/assist for financial management;Assist for transportation;Help with stairs or ramp for entrance ? ?  ?Equipment Recommendations None recommended by PT  ?Recommendations for Other Services ?    ?  ?Functional Status Assessment Patient has had a recent decline in their functional status and demonstrates the ability to make significant  improvements in function in a reasonable and predictable amount of time.  ? ?  ?Precautions / Restrictions Precautions ?Precautions: Fall ?Restrictions ?Weight Bearing Restrictions: No ?LLE Weight Bearing: Weight bearing as tolerated  ? ?  ? ?Mobility ? Bed Mobility ?Overal bed mobility: Needs Assistance ?Bed Mobility: Supine to Sit, Sit to Supine ?  ?  ?Supine to sit: Max assist ?Sit to supine: Max assist ?  ?General bed mobility comments: assist for trunk and LE management to/from EOB, boost up in bed requiring +2. ?  ? ?Transfers ?  ?  ?  ?  ?  ?  ?  ?  ?  ?General transfer comment: unable to attempt, pt screaming in pain ?  ? ?Ambulation/Gait ?  ?  ?  ?  ?  ?  ?  ?General Gait Details: nt ? ?Stairs ?  ?  ?  ?  ?  ? ?Wheelchair Mobility ?  ? ?Modified Rankin (Stroke Patients Only) ?  ? ?  ? ?Balance Overall balance assessment: Needs assistance ?Sitting-balance support: Feet supported, Bilateral upper extremity supported ?Sitting balance-Leahy Scale: Poor ?Sitting balance - Comments: heavy R lateral leaning to offweight L hip ?Postural control: Right lateral lean ?  ?  ?Standing balance comment: unable to attempt today ?  ?  ?  ?  ?  ?  ?  ?  ?  ?  ?  ?   ? ? ? ?Pertinent Vitals/Pain Pain Assessment ?Pain Assessment: Faces ?Faces Pain Scale: Hurts whole lot ?Pain Location: L hip, during mobility ?Pain Descriptors / Indicators: Discomfort, Grimacing, Guarding, Sore, Crying ?Pain Intervention(s): Limited activity within patient's tolerance, Monitored during session, Repositioned  ? ? ?Home Living Family/patient expects to be discharged to:: Private residence ?Living Arrangements: Children (stays  at daughter's house, son assists during the week) ?Available Help at Discharge: Family;Other (Comment) (per chart review, pt's grandson stays on weekday nights and daughter stays on weekends) ?Type of Home: House ?Home Access: Stairs to enter ?Entrance Stairs-Rails: None ?Entrance Stairs-Number of Steps: 1 ?  ?Home Layout:  Able to live on main level with bedroom/bathroom ?Home Equipment: Rolling Walker (2 wheels);BSC/3in1;Wheelchair - manual ?Additional Comments: daughter bathes pt in bed/on toilet  ?  ?Prior Function Prior Level of Function : Needs assist;Patient poor historian/Family not available ?  ?  ?  ?  ?  ?  ?Mobility Comments: uses RW for mobility or 1 person assist ?ADLs Comments: assists with dressing and showering ?  ? ? ?Hand Dominance  ? Dominant Hand: Right ? ?  ?Extremity/Trunk Assessment  ? Upper Extremity Assessment ?Upper Extremity Assessment: Defer to OT evaluation ?  ? ?Lower Extremity Assessment ?Lower Extremity Assessment: Generalized weakness;LLE deficits/detail ?LLE Deficits / Details: limited by pain; able to perform ankle pumps, limited ROM heel slide limited by hip pain ?LLE: Unable to fully assess due to pain ?  ? ?Cervical / Trunk Assessment ?Cervical / Trunk Assessment: Kyphotic  ?Communication  ? Communication: No difficulties  ?Cognition Arousal/Alertness: Awake/alert ?Behavior During Therapy: Restless ?Overall Cognitive Status: History of cognitive impairments - at baseline ?  ?  ?  ?  ?  ?  ?  ?  ?  ?  ?  ?  ?  ?  ?  ?  ?General Comments: history of dementia; pt oriented to self only and initially stating she did not fall and break her hip, became increasingly understanding when PT explained situation. ?  ?  ? ?  ?General Comments General comments (skin integrity, edema, etc.): L elbow scabbing with min bleeding on pad noted ? ?  ?Exercises General Exercises - Lower Extremity ?Ankle Circles/Pumps: AROM, Both, 10 reps, Seated  ? ?Assessment/Plan  ?  ?PT Assessment Patient needs continued PT services  ?PT Problem List Decreased strength;Decreased mobility;Decreased safety awareness;Decreased activity tolerance;Decreased balance;Decreased knowledge of use of DME;Pain;Decreased cognition;Decreased skin integrity ? ?   ?  ?PT Treatment Interventions DME instruction;Therapeutic activities;Gait  training;Patient/family education;Therapeutic exercise;Balance training;Functional mobility training;Neuromuscular re-education   ? ?PT Goals (Current goals can be found in the Care Plan section)  ?Acute Rehab PT Goals ?PT Goal Formulation: With patient ?Time For Goal Achievement: 04/07/21 ?Potential to Achieve Goals: Good ? ?  ?Frequency Min 3X/week ?  ? ? ?Co-evaluation   ?  ?  ?  ?  ? ? ?  ?AM-PAC PT "6 Clicks" Mobility  ?Outcome Measure Help needed turning from your back to your side while in a flat bed without using bedrails?: A Lot ?Help needed moving from lying on your back to sitting on the side of a flat bed without using bedrails?: Total ?Help needed moving to and from a bed to a chair (including a wheelchair)?: Total ?Help needed standing up from a chair using your arms (e.g., wheelchair or bedside chair)?: Total ?Help needed to walk in hospital room?: Total ?Help needed climbing 3-5 steps with a railing? : Total ?6 Click Score: 7 ? ?  ?End of Session   ?Activity Tolerance: Patient limited by pain ?Patient left: in bed;with call bell/phone within reach;with bed alarm set;with SCD's reapplied ?Nurse Communication: Mobility status ?PT Visit Diagnosis: Other abnormalities of gait and mobility (R26.89);Pain ?Pain - Right/Left: Left ?Pain - part of body: Hip ?  ? ?Time: 4401-0272 ?PT Time Calculation (min) (ACUTE ONLY):  17 min ? ? ?Charges:   PT Evaluation ?$PT Eval Low Complexity: 1 Low ?  ?  ?   ? ?Stacie Glaze, PT DPT ?Acute Rehabilitation Services ?Pager [phone]  ?Office [phone] ? ? ?Keiarra Charon E Stroup ?03/24/2021, 10:08 AM ? ?

## 2021-03-24 NOTE — Progress Notes (Signed)
PROGRESS NOTE  GERAL TUCH TSV:[id number] DOB: 05/14/1931 DOA: 03/21/2021 PCP: Cari Caraway, MD  HPI/Recap of past 24 hours: CHE BELOW is a 88 y.o. female with medical history significant for advanced dementia, DVT/PE on Eliquis, HTN, who presented from home after a fall.  Patient lives at home. History is obtained from her daughter who is also her medical POA via phone.  Per her daughter pt got out of bed took a few steps then fell. No LOC. She was in her usual state of health prior to this. In ED, VS fairly stable, lab studies remarkable for creatinine 1.43, baseline 0.9. Left hip x-ray shows subcapital hip fracture.  Orthopedic surgery, Dr. Sammuel Hines consulted. Patient is on Eliquis, allow for washout with surgical date 03/23/2021.  TR[facility], hospitalist service, was asked to admit.      Today, patient denies any new complaints, looks comfortable     Assessment/Plan: Principal Problem:   Closed left hip fracture (HCC)   Left hip fracture on Eliquis post fall, POA Left pelvic x-ray showing pathologic subcapital left femoral neck fracture with mild varus angulation. Orthopedic surgery consulted, s/p left hip hemiarthroplasty on 03/23/2021 DVT PPx, pain management as per Ortho PT/OT- SNF   History of DVT/PE on Eliquis Restart Eliquis  DVT and PE were diagnosed 2 years ago BLE Doppler neg for DVT   AKI on CKD 3a, suspect prerenal in the setting of poor oral intake Cr rising  Baseline creatinine appears to be 0.9- 1, Cr 1.4 on admission Continue gentle IVF Monitor urine output with strict I's and O's.   Essential hypertension BP better control, likely worsened by pain Continue recently started Norvasc, IV hydralazine prn Continue to hold home quinapril and HCTZ due to AKI  Umbilical erythema Chronic problem Noted some redness, tenderness, minimal exudate within umbilicus WOC consulted, appreciate care   Advanced dementia Reorient as needed Delirium/aspiration/fall  precautions PT OT to assess postsurgery with orthopedic surgery's guidance      Estimated body mass index is 25.4 kg/m as calculated from the following:   Height as of this encounter: 5' (1.524 m).   Weight as of this encounter: 59 kg.     Code Status: Full  Family Communication: Daughter at bedside  Disposition Plan: Status is: Inpatient Remains inpatient appropriate because: Level of care    Consultants: Orthopedics  Procedures: Left hip hemiarthroplasty  Antimicrobials: None  DVT prophylaxis: Eliquis   Objective: Vitals:   03/24/21 0405 03/24/21 0758 03/24/21 1034 03/24/21 1602  BP: (!) 124/44 (!) 119/41 (!) 133/53 108/76  Pulse: 73   90  Resp: '15 16  15  '$ Temp: 99.1 F (37.3 C) 98.6 F (37 C)  98.9 F (37.2 C)  TempSrc: Oral Oral  Oral  SpO2: 97%   98%  Weight:      Height:        Intake/Output Summary (Last 24 hours) at 03/24/2021 1634 Last data filed at 03/24/2021 1533 Gross per 24 hour  Intake 755.32 ml  Output 600 ml  Net 155.32 ml   Filed Weights   03/21/21 0325 03/21/21 2236  Weight: 54.4 kg 59 kg    Exam: General: NAD  Cardiovascular: S1, S2 present Respiratory: CTAB Abdomen: Soft, nontender, nondistended, bowel sounds present, some redness, tenderness, minimal exudate within umbilicus Musculoskeletal: No bilateral pedal edema noted, left hip dressing C/D/I Skin: As noted above, multiple bruises noted in extremities Psychiatry: Unable to assess    Data Reviewed: CBC: Recent Labs  Lab 03/21/21 0322  03/22/21 0210 03/23/21 0141 03/24/21 0220  WBC 8.9 10.1 9.3 12.9*  NEUTROABS 5.6  --  6.4 10.5*  HGB 11.6* 11.1* 10.0* 9.1*  HCT 36.9 35.0* 30.9* 28.1*  MCV 101.9* 101.4* 102.3* 101.1*  PLT 293 241 222 295   Basic Metabolic Panel: Recent Labs  Lab 03/21/21 0322 03/22/21 0210 03/23/21 0141 03/24/21 0220  NA 143 140 140 139  K 3.9 4.5 4.5 4.7  CL 110 110 108 107  CO2 '22 22 24 '$ 21*  GLUCOSE 131* 128* 100* 116*  BUN 30* 32*  41* 46*  CREATININE 1.40* 1.36* 1.66* 1.82*  CALCIUM 8.8* 8.8* 8.6* 8.1*  MG  --  2.0  --   --   PHOS  --  3.4  --   --    GFR: Estimated Creatinine Clearance: 17.2 mL/min (A) (by C-G formula based on SCr of 1.82 mg/dL (H)). Liver Function Tests: Recent Labs  Lab 03/21/21 0322 03/22/21 0210  AST 18  --   ALT 14  --   ALKPHOS 71  --   BILITOT 0.4  --   PROT 6.4*  --   ALBUMIN 3.4* 2.9*   No results for input(s): LIPASE, AMYLASE in the last 168 hours. No results for input(s): AMMONIA in the last 168 hours. Coagulation Profile: No results for input(s): INR, PROTIME in the last 168 hours. Cardiac Enzymes: No results for input(s): CKTOTAL, CKMB, CKMBINDEX, TROPONINI in the last 168 hours. BNP (last 3 results) No results for input(s): PROBNP in the last 8760 hours. HbA1C: No results for input(s): HGBA1C in the last 72 hours. CBG: No results for input(s): GLUCAP in the last 168 hours. Lipid Profile: No results for input(s): CHOL, HDL, LDLCALC, TRIG, CHOLHDL, LDLDIRECT in the last 72 hours. Thyroid Function Tests: No results for input(s): TSH, T4TOTAL, FREET4, T3FREE, THYROIDAB in the last 72 hours. Anemia Panel: No results for input(s): VITAMINB12, FOLATE, FERRITIN, TIBC, IRON, RETICCTPCT in the last 72 hours. Urine analysis:    Component Value Date/Time   COLORURINE YELLOW 03/22/2021 0130   APPEARANCEUR CLEAR 03/22/2021 0130   LABSPEC 1.017 03/22/2021 0130   PHURINE 5.0 03/22/2021 0130   GLUCOSEU NEGATIVE 03/22/2021 0130   HGBUR NEGATIVE 03/22/2021 0130   BILIRUBINUR NEGATIVE 03/22/2021 0130   KETONESUR NEGATIVE 03/22/2021 0130   PROTEINUR NEGATIVE 03/22/2021 0130   UROBILINOGEN 1.0 11/12/2014 1525   NITRITE NEGATIVE 03/22/2021 0130   LEUKOCYTESUR TRACE (A) 03/22/2021 0130   Sepsis Labs: '@LABRCNTIP'$ (procalcitonin:4,lacticidven:4)  ) Recent Results (from the past 240 hour(s))  Resp Panel by RT-PCR (Flu A&B, Covid) Nasopharyngeal Swab     Status: None   Collection  Time: 03/21/21  4:26 AM   Specimen: Nasopharyngeal Swab; Nasopharyngeal(NP) swabs in vial transport medium  Result Value Ref Range Status   SARS Coronavirus 2 by RT PCR NEGATIVE NEGATIVE Final    Comment: (NOTE) SARS-CoV-2 target nucleic acids are NOT DETECTED.  The SARS-CoV-2 RNA is generally detectable in upper respiratory specimens during the acute phase of infection. The lowest concentration of SARS-CoV-2 viral copies this assay can detect is 138 copies/mL. A negative result does not preclude SARS-Cov-2 infection and should not be used as the sole basis for treatment or other patient management decisions. A negative result may occur with  improper specimen collection/handling, submission of specimen other than nasopharyngeal swab, presence of viral mutation(s) within the areas targeted by this assay, and inadequate number of viral copies(<138 copies/mL). A negative result must be combined with clinical observations, patient history, and epidemiological information.  The expected result is Negative.  Fact Sheet for Patients:  EntrepreneurPulse.com.au  Fact Sheet for Healthcare Providers:  IncredibleEmployment.be  This test is no t yet approved or cleared by the [place] FDA and  has been authorized for detection and/or diagnosis of SARS-CoV-2 by FDA under an Emergency Use Authorization (EUA). This EUA will remain  in effect (meaning this test can be used) for the duration of the COVID-19 declaration under Section 564(b)(1) of the Act, 21 U.S.C.section 360bbb-3(b)(1), unless the authorization is terminated  or revoked sooner.       Influenza A by PCR NEGATIVE NEGATIVE Final   Influenza B by PCR NEGATIVE NEGATIVE Final    Comment: (NOTE) The Xpert Xpress SARS-CoV-2/FLU/RSV plus assay is intended as an aid in the diagnosis of influenza from Nasopharyngeal swab specimens and should not be used as a sole basis for treatment. Nasal washings  and aspirates are unacceptable for Xpert Xpress SARS-CoV-2/FLU/RSV testing.  Fact Sheet for Patients: EntrepreneurPulse.com.au  Fact Sheet for Healthcare Providers: IncredibleEmployment.be  This test is not yet approved or cleared by the [place] FDA and has been authorized for detection and/or diagnosis of SARS-CoV-2 by FDA under an Emergency Use Authorization (EUA). This EUA will remain in effect (meaning this test can be used) for the duration of the COVID-19 declaration under Section 564(b)(1) of the Act, 21 U.S.C. section 360bbb-3(b)(1), unless the authorization is terminated or revoked.  Performed at [facility], [address] [place], [place] [address]   Surgical pcr screen     Status: Abnormal   Collection Time: 03/23/21  6:58 AM   Specimen: Nasal Mucosa; Nasal Swab  Result Value Ref Range Status   MRSA, PCR NEGATIVE NEGATIVE Final   Staphylococcus aureus POSITIVE (A) NEGATIVE Final    Comment: (NOTE) The Xpert SA Assay (FDA approved for NASAL specimens in patients 52 years of age and older), is one component of a comprehensive surveillance program. It is not intended to diagnose infection nor to guide or monitor treatment. Performed at [facility], [place] [address], [place], [place] [address]       Studies: No results found.  Scheduled Meds:  sodium chloride   Intravenous Once   amLODipine  5 mg Oral Daily   apixaban  2.5 mg Oral BID   docusate sodium  100 mg Oral BID   folic acid  1 mg Oral Daily   melatonin  3 mg Oral QHS   mupirocin cream   Topical BID   vitamin B-12  250 mcg Oral Daily    Continuous Infusions:  sodium chloride 75 mL/hr at 03/24/21 1450     LOS: 3 days     Alma Friendly, MD [facility]  If 7PM-7AM, please contact night-coverage [email or web address] 03/24/2021, 4:34 PM

## 2021-03-24 NOTE — Evaluation (Signed)
Occupational Therapy Evaluation Patient Details Name: Julie Martinez MRN: [id number] DOB: [birth year] Today's Date: 03/24/2021   History of Present Illness 88 yo female presents to [facility] on 3/[facility] with fall OOB, found by grandson, sustained L femoral neck fx. s/p L hip hemiarthroplasty on 3/5. PMH includes dementia, DVT/PE, HTN, osteopenia.   Clinical Impression   Pt  requires assistance at baseline for ADLs (bathing) and uses rollator for mobility, daughter and grandson stay with pt to provide 24/7 assistance at home. Pt currently mod-max A for ADLs, mod A +2 for bed mobility, and mod A +2 for sit to stand transfer. Pt with increased pain, stand pivot/step pivot transfers deferred at this time. Pt presenting with impairments listed below, will follow acutely. Recommend SNF at d/c.     Recommendations for follow up therapy are one component of a multi-disciplinary discharge planning process, led by the attending physician.  Recommendations may be updated based on patient status, additional functional criteria and insurance authorization.   Follow Up Recommendations  Skilled nursing-short term rehab (<3 hours/day)    Assistance Recommended at Discharge Intermittent Supervision/Assistance  Patient can return home with the following Two people to help with walking and/or transfers;A lot of help with bathing/dressing/bathroom;Assistance with cooking/housework;Help with stairs or ramp for entrance    Functional Status Assessment  Patient has had a recent decline in their functional status and demonstrates the ability to make significant improvements in function in a reasonable and predictable amount of time.  Equipment Recommendations  None recommended by OT;Other (comment) (defer to next venue of care)    Recommendations for Other Services       Precautions / Restrictions Precautions Precautions: Fall Restrictions Weight Bearing Restrictions: No LLE Weight Bearing: Weight bearing as  tolerated      Mobility Bed Mobility Overal bed mobility: Needs Assistance Bed Mobility: Supine to Sit, Sit to Supine     Supine to sit: +2 for physical assistance, Mod assist Sit to supine: Mod assist, +2 for physical assistance   General bed mobility comments: assist for trunk elevation/LE movement to EOB    Transfers Overall transfer level: Needs assistance Equipment used: 2 person hand held assist Transfers: Sit to/from Stand Sit to Stand: Mod assist, +2 physical assistance, Max assist           General transfer comment: transfer deferred due to pain/fatigue, pt unable to initiate steps      Balance Overall balance assessment: Needs assistance Sitting-balance support: Feet supported, Bilateral upper extremity supported Sitting balance-Leahy Scale: Poor Sitting balance - Comments: heavy R lateral leaning to offweight L hip Postural control: Right lateral lean Standing balance support: Bilateral upper extremity supported Standing balance-Leahy Scale: Zero                             ADL either performed or assessed with clinical judgement   ADL Overall ADL's : Needs assistance/impaired Eating/Feeding: Set up;Sitting   Grooming: Minimal assistance;Wash/dry hands;Wash/dry face;Sitting Grooming Details (indicate cue type and reason): completes grooming sitting EOB Upper Body Bathing: Moderate assistance;Sitting   Lower Body Bathing: Maximal assistance;Sitting/lateral leans   Upper Body Dressing : Minimal assistance;Sitting   Lower Body Dressing: Maximal assistance;Sitting/lateral leans   Toilet Transfer: Moderate assistance;+2 for physical assistance;BSC/3in1;Stand-pivot   Toileting- Clothing Manipulation and Hygiene: Maximal assistance;Sitting/lateral lean;Sit to/from stand       Functional mobility during ADLs: +2 for physical assistance;Moderate assistance       Vision  Vision Assessment?: No apparent visual deficits     Perception      Praxis      Pertinent Vitals/Pain Pain Assessment Pain Assessment: Faces Pain Score: 8  Faces Pain Scale: Hurts whole lot Pain Location: L hip, during mobility Pain Descriptors / Indicators: Discomfort, Grimacing, Guarding, Sore, Crying Pain Intervention(s): Limited activity within patient's tolerance, Monitored during session, Patient requesting pain meds-RN notified, Repositioned     Hand Dominance Right   Extremity/Trunk Assessment Upper Extremity Assessment Upper Extremity Assessment: Generalized weakness   Lower Extremity Assessment Lower Extremity Assessment: Defer to PT evaluation LLE Deficits / Details: limited by pain; able to perform ankle pumps, limited ROM heel slide limited by hip pain LLE: Unable to fully assess due to pain   Cervical / Trunk Assessment Cervical / Trunk Assessment: Kyphotic   Communication Communication Communication: No difficulties   Cognition Arousal/Alertness: Awake/alert Behavior During Therapy: Restless Overall Cognitive Status: History of cognitive impairments - at baseline                                 General Comments: history of advanced dementia, requires frequent reminders that pain is from hip fx surgery     General Comments  daugther present during session, confirms PLOF questions,    Exercises     Shoulder Instructions      Home Living Family/patient expects to be discharged to:: Private residence Living Arrangements: Children ([place] stays with pt during the week at her house, stays with daugther on wknd) Available Help at Discharge: Family Type of Home: House Home Access: Stairs to enter Technical brewer of Steps: 1 Entrance Stairs-Rails: None Home Layout: Able to live on main level with bedroom/bathroom     Bathroom Shower/Tub: Tub/shower unit;Curtain   Biochemist, clinical: Standard     Home Equipment: Conservation officer, nature (2 wheels);BSC/3in1;Wheelchair - manual   Additional Comments:  daughter bathes pt in bed/on toilet      Prior Functioning/Environment Prior Level of Function : Needs assist       Physical Assist : ADLs (physical)   ADLs (physical): Bathing Mobility Comments: uses RW for mobility or 1 person assist ADLs Comments: assists with dressing and showering        OT Problem List: Decreased strength;Decreased range of motion;Decreased activity tolerance;Impaired balance (sitting and/or standing);Decreased cognition;Decreased safety awareness;Decreased knowledge of use of DME or AE      OT Treatment/Interventions: Self-care/ADL training;Therapeutic exercise;Energy conservation;DME and/or AE instruction;Therapeutic activities;Visual/perceptual remediation/compensation;Patient/family education;Balance training    OT Goals(Current goals can be found in the care plan section) Acute Rehab OT Goals Patient Stated Goal: to get better OT Goal Formulation: With patient Time For Goal Achievement: 04/07/21 Potential to Achieve Goals: Fair  OT Frequency: Min 2X/week    Co-evaluation              AM-PAC OT "6 Clicks" Daily Activity     Outcome Measure Help from another person eating meals?: None Help from another person taking care of personal grooming?: A Little Help from another person toileting, which includes using toliet, bedpan, or urinal?: Total Help from another person bathing (including washing, rinsing, drying)?: A Lot Help from another person to put on and taking off regular upper body clothing?: A Lot Help from another person to put on and taking off regular lower body clothing?: Total 6 Click Score: 13   End of Session Equipment Utilized During Treatment: Gait belt Nurse Communication: Mobility status;Patient  requests pain meds  Activity Tolerance: Patient limited by fatigue;Patient limited by pain Patient left: in bed;with call bell/phone within reach;with bed alarm set;with nursing/sitter in room;with family/visitor present  OT Visit  Diagnosis: Unsteadiness on feet (R26.81);Other abnormalities of gait and mobility (R26.89);Muscle weakness (generalized) (M62.81);History of falling (Z91.81);Pain Pain - Right/Left: Left Pain - part of body: Leg;Hip                Time: 1000-1027 OT Time Calculation (min): 27 min Charges:  OT General Charges $OT Visit: 1 Visit OT Evaluation $OT Eval Moderate Complexity: 1 Mod OT Treatments $Self Care/Home Management : 8-22 mins  Lynnda Child, OTD, OTR/L Acute Rehab [phone]) [phone] - [place] 03/24/2021, 12:30 PM

## 2021-03-24 NOTE — Plan of Care (Signed)

## 2021-03-24 NOTE — Progress Notes (Signed)
?  Subjective: ?Julie Martinez is a 88 y.o. female s/p left hip hemiarthroplasty.  They are POD 1.  Pt's pain is controlled.  Pt has not ambulated yet upon evaluation earlier this morning.  She denies any fevers, chills, chest pain, shortness of breath.  Does complain of pain in the right groin with some anterior proximal thigh pain.  Overall pain is better than it was prior to surgery yesterday. ? ?Objective: ?Vital signs in last 24 hours: ?Temp:  [97.6 ?F (36.4 ?C)-99.1 ?F (37.3 ?C)] 98.6 ?F (37 ?C) (03/06 0758) ?Pulse Rate:  [69-83] 73 (03/06 0405) ?Resp:  [14-18] 16 (03/06 0758) ?BP: (109-133)/(41-58) 133/53 (03/06 1034) ?SpO2:  [94 %-99 %] 97 % (03/06 0405) ? ?Intake/Output from previous day: ?03/05 0701 - 03/06 0700 ?In: 1908.4 [P.O.:50; I.V.:1658.4; IV Piggyback:200] ?Out: 810 [Urine:710; Blood:100] ?Intake/Output this shift: ?No intake/output data recorded. ? ?Exam: ? ?No gross blood or drainage overlying the dressing ?1+ DP pulse ?Sensation intact distally in the left foot ?Able to dorsiflex and plantarflex the left foot.  EHL fires. ?No significant calf tenderness.  Negative Homans' sign. ? ? ?Labs: ?Recent Labs  ?  03/22/21 ?0210 03/23/21 ?0141 03/24/21 ?0220  ?HGB 11.1* 10.0* 9.1*  ? ?Recent Labs  ?  03/23/21 ?0141 03/24/21 ?0220  ?WBC 9.3 12.9*  ?RBC 3.02* 2.78*  ?HCT 30.9* 28.1*  ?PLT 222 207  ? ?Recent Labs  ?  03/23/21 ?0141 03/24/21 ?0220  ?NA 140 139  ?K 4.5 4.7  ?CL 108 107  ?CO2 24 21*  ?BUN 41* 46*  ?CREATININE 1.66* 1.82*  ?GLUCOSE 100* 116*  ?CALCIUM 8.6* 8.1*  ? ?No results for input(s): LABPT, INR in the last 72 hours. ? ?Assessment/Plan: ?Pt is POD 1 s/p left hip hemiarthroplasty.   ? -Disposition pending medical clearance with likely discharge to SNF.  Appreciate management by the hospitalist team. ? -WBAT with a walker ? -Eliquis and SCDs for DVT prophylaxis ? -Follow-up with Dr. Marlou Sa 2 weeks postoperatively ?  ? ? ?Gerrianne Scale Shaniqua Guillot ?03/24/2021, 12:50 PM  ? ? ?   ?

## 2021-03-24 NOTE — NC FL2 (Signed)
?[place] MEDICAID FL2 LEVEL OF CARE SCREENING TOOL  ?  ? ?IDENTIFICATION  ?Patient Name: ?Julie Martinez Birthdate: 01-Jun-1931 Sex: female Admission Date (Current Location): ?03/21/2021  ?[place] and [place] Number: ? [place] ?  Facility and Address:  ?[facility] [place] [address], [place], [place] [address] ?     Provider Number: ?[id number]  ?Attending Physician Name and Address:  ?Alma Friendly, MD ? Relative Name and Phone Number:  ?Pegg,Donna Daughter [phone]  [phone] ?   ?Current Level of Care: ?Hospital Recommended Level of Care: ?[place] Prior Approval Number: ?  ? ?Date Approved/Denied: ?  PASRR Number: ?[id number] A ? ?Discharge Plan: ?SNF ?  ? ?Current Diagnoses: ?Patient Active Problem List  ? Diagnosis Date Noted  ? Closed left hip fracture ([place]) 03/21/2021  ? Essential hypertension 01/31/2018  ? CAP (community acquired pneumonia) 01/30/2018  ? Pneumonia 01/30/2018  ? Dementia ([place]) 01/04/2017  ? Falls 01/04/2017  ? Gait abnormality 01/04/2017  ? Left lumbar radiculopathy 05/25/2016  ? Complaints of memory disturbance 01/28/2015  ? Depression 01/28/2015  ? ? ?Orientation RESPIRATION BLADDER Height & Weight   ?  ?Self ? O2 Incontinent, Indwelling catheter Weight: 130 lb 1.1 oz (59 kg) ?Height:  5' (152.4 cm)  ?BEHAVIORAL SYMPTOMS/MOOD NEUROLOGICAL BOWEL NUTRITION STATUS  ?    Incontinent Diet (see discharge summary)  ?AMBULATORY STATUS COMMUNICATION OF NEEDS Skin   ?Total Care Verbally Surgical wounds ?  ?  ?  ?    ?     ?     ? ? ?Personal Care Assistance Level of Assistance  ?Bathing, Feeding, Dressing Bathing Assistance: Maximum assistance ?Feeding assistance: Limited assistance ?Dressing Assistance: Maximum assistance ?   ? ?Functional Limitations Info  ?Sight, Hearing, Speech Sight Info: Impaired ?Hearing Info: Impaired ?Speech Info: Adequate  ? ? ?SPECIAL CARE FACTORS FREQUENCY  ?PT (By licensed PT), OT (By licensed OT)   ?  ?PT Frequency:  5x week ?OT Frequency: 5x week ?  ?  ?  ?   ? ? ?Contractures Contractures Info: Not present  ? ? ?Additional Factors Info  ?Code Status, Allergies Code Status Info: full ?Allergies Info: NKA ?  ?  ?  ?   ? ?Current Medications (03/24/2021):  This is the current hospital active medication list ?Current Facility-Administered Medications  ?Medication Dose Route Frequency Provider Last Rate Last Admin  ? 0.9 %  sodium chloride infusion (Manually program via Guardrails IV Fluids)   Intravenous Once Janene Harvey, CRNA      ? 0.9 %  sodium chloride infusion   Intravenous Continuous Alma Friendly, MD 75 mL/hr at 03/24/21 1450 New Bag at 03/24/21 1450  ? acetaminophen (TYLENOL) tablet 325-650 mg  325-650 mg Oral Q6H PRN Magnant, Charles L, PA-C   650 mg at 03/24/21 1454  ? amLODipine (NORVASC) tablet 5 mg  5 mg Oral Daily Irene Pap N, DO   5 mg at 03/24/21 1034  ? apixaban (ELIQUIS) tablet 2.5 mg  2.5 mg Oral BID Hammons, Kimberly B, RPH   2.5 mg at 03/24/21 1036  ? docusate sodium (COLACE) capsule 100 mg  100 mg Oral BID Magnant, Charles L, PA-C   100 mg at 32/12/24 8250  ? folic acid (FOLVITE) tablet 1 mg  1 mg Oral Daily Montura, Carole N, DO   1 mg at 03/24/21 1035  ? hydrALAZINE (APRESOLINE) injection 10 mg  10 mg Intravenous Q8H PRN Alma Friendly, MD      ?  HYDROcodone-acetaminophen (NORCO/VICODIN) 5-325 MG per tablet 1-2 tablet  1-2 tablet Oral Q4H PRN Magnant, Charles L, PA-C      ? melatonin tablet 3 mg  3 mg Oral QHS Hall, Carole N, DO   3 mg at 03/23/21 2255  ? menthol-cetylpyridinium (CEPACOL) lozenge 3 mg  1 lozenge Oral PRN Magnant, Charles L, PA-C      ? Or  ? phenol (CHLORASEPTIC) mouth spray 1 spray  1 spray Mouth/Throat PRN Magnant, Charles L, PA-C      ? metoCLOPramide (REGLAN) tablet 5-10 mg  5-10 mg Oral Q8H PRN Magnant, Charles L, PA-C      ? Or  ? metoCLOPramide (REGLAN) injection 5-10 mg  5-10 mg Intravenous Q8H PRN Magnant, Charles L, PA-C      ? morphine (PF) 2 MG/ML injection  0.5 mg  0.5 mg Intravenous Q2H PRN Magnant, Charles L, PA-C      ? mupirocin cream (BACTROBAN) 2 %   Topical BID Alma Friendly, MD   Given at 03/24/21 1052  ? ondansetron (ZOFRAN) tablet 4 mg  4 mg Oral Q6H PRN Magnant, Charles L, PA-C      ? Or  ? ondansetron (ZOFRAN) injection 4 mg  4 mg Intravenous Q6H PRN Magnant, Charles L, PA-C      ? polyethylene glycol (MIRALAX / GLYCOLAX) packet 17 g  17 g Oral Daily PRN Irene Pap N, DO      ? vitamin B-12 (CYANOCOBALAMIN) tablet 250 mcg  250 mcg Oral Daily Irene Pap N, DO   250 mcg at 03/24/21 1035  ? ? ? ?Discharge Medications: ?Please see discharge summary for a list of discharge medications. ? ?Relevant Imaging Results: ? ?Relevant Lab Results: ? ? ?Additional Information ?SSN: [id number].  Pt is not vaccinated for covid. ? ?Joanne Chars, LCSW ? ? ? ? ?

## 2021-03-24 NOTE — Plan of Care (Signed)

## 2021-03-24 NOTE — Care Management Important Message (Signed)
Important Message ? ?Patient Details  ?Name: Julie Martinez ?MRN: [id number] ?Date of Birth: 1931-05-30 ? ? ?Medicare Important Message Given:  Yes ? ? ? ? ?Levada Dy  Wynn Alldredge-Martin ?03/24/2021, 12:43 PM ?

## 2021-03-24 NOTE — TOC Initial Note (Signed)
Transition of Care (TOC) - Initial/Assessment Note  ? ? ?Patient Details  ?Name: Julie Martinez ?MRN: [id number] ?Date of Birth: 11/07/1931 ? ?Transition of [facility]) CM/SW Contact:    ?Joanne Chars, LCSW ?Phone Number: ?03/24/2021, 4:28 PM ? ?Clinical Narrative:    Pt oriented x1, CSW spoke with daugher [name] by phone.  Pt lives at home, has a grandson who stays with her during the week and then [name] has her at [employer] home each weekend.  [name] agreeable to SNF placement, was at [facility] before but would like all SNF options.  Pt is not vaccinated for covid. ? ?Referral sent out in hub for SNF.             ? ? ?Expected Discharge Plan: [place] ?Barriers to Discharge: Continued Medical Work up, SNF Pending bed offer ? ? ?Patient Goals and CMS Choice ?  ?  ?  ? ?Expected Discharge Plan and Services ?Expected Discharge Plan: [place] ?In-house Referral: Clinical Social Work ?  ?  ?Living arrangements for the past 2 months: [place] ?                ?  ?  ?  ?  ?  ?  ?  ?  ?  ?  ? ?Prior Living Arrangements/Services ?Living arrangements for the past 2 months: [place] ?Lives with:: Relatives (grandson) ?Patient language and need for interpreter reviewed:: No ?       ?Need for Family Participation in Patient Care: Yes (Comment) ?Care giver support system in place?: Yes (comment) ?Current home services: Other (comment) (none) ?Criminal Activity/Legal Involvement Pertinent to Current Situation/Hospitalization: No - Comment as needed ? ?Activities of Daily Living ?Home Assistive Devices/Equipment: [place] Rile (specify type) ?ADL Screening (condition at time of admission) ?Patient's cognitive ability adequate to safely complete daily activities?: No ?Is the patient deaf or have difficulty hearing?: No ?Does the patient have difficulty seeing, even when wearing glasses/contacts?: No ?Does the patient have difficulty concentrating, remembering, or making decisions?:  Yes ?Patient able to express need for assistance with ADLs?: No ?Does the patient have difficulty dressing or bathing?: Yes ?Independently performs ADLs?: No ?Communication: Needs assistance ?Dressing (OT): Needs assistance ?Grooming: Needs assistance ?Feeding: Needs assistance ?Bathing: Dependent ?Toileting: Needs assistance ?In/Out Bed: Needs assistance ?Walks in Home: Needs assistance ?Does the patient have difficulty walking or climbing stairs?: Yes ?Weakness of Legs: Left ?Weakness of Arms/Hands: None ? ?Permission Sought/Granted ?  ?  ?   ?   ?   ?   ? ?Emotional Assessment ?Appearance:: Appears stated age ?Attitude/Demeanor/Rapport: Unable to Assess ?Affect (typically observed): Pleasant ?Orientation: : Oriented to Self ?Alcohol / Substance Use: Not Applicable ?Psych Involvement: No (comment) ? ?Admission diagnosis:  Closed left hip fracture ([place]) [S72.002A] ?Closed fracture of left hip, initial encounter ([place]) [S72.002A] ?Patient Active Problem List  ? Diagnosis Date Noted  ? Closed left hip fracture ([place]) 03/21/2021  ? Essential hypertension 01/31/2018  ? CAP (community acquired pneumonia) 01/30/2018  ? Pneumonia 01/30/2018  ? Dementia ([place]) 01/04/2017  ? Falls 01/04/2017  ? Gait abnormality 01/04/2017  ? Left lumbar radiculopathy 05/25/2016  ? Complaints of memory disturbance 01/28/2015  ? Depression 01/28/2015  ? ?PCP:  Cari Caraway, MD ?Pharmacy:   ?[id number] [place], [place] - [address] [id number]?Phone: [phone]: [phone]? ? ? ? ?Social Determinants of Health (SDOH) Interventions ?  ? ?Readmission Risk  Interventions ?No flowsheet data found. ? ? ?

## 2021-03-24 NOTE — Progress Notes (Signed)
The patient is injury-free, afebrile, alert, and oriented X 1. Vital signs were within the baseline during this shift. Her surgical pain well controlled with current pain regimen. Pt denies chest pain, SOB, nausea, vomiting, dizziness, signs or symptoms of bleeding or infection, or acute changes during this shift. We will continue to monitor and work toward achieving the care plan goals. ?

## 2021-03-25 DIAGNOSIS — S72002A Fracture of unspecified part of neck of left femur, initial encounter for closed fracture: Secondary | ICD-10-CM | POA: Diagnosis not present

## 2021-03-25 DIAGNOSIS — I10 Essential (primary) hypertension: Secondary | ICD-10-CM | POA: Diagnosis not present

## 2021-03-25 LAB — CBC WITH DIFFERENTIAL/PLATELET
Abs Immature Granulocytes: 0.06 10*3/uL (ref 0.00–0.07)
Basophils Absolute: 0 10*3/uL (ref 0.0–0.1)
Basophils Relative: 0 %
Eosinophils Absolute: 0.2 10*3/uL (ref 0.0–0.5)
Eosinophils Relative: 2 %
HCT: 25.4 % — ABNORMAL LOW (ref 36.0–46.0)
Hemoglobin: 8 g/dL — ABNORMAL LOW (ref 12.0–15.0)
Immature Granulocytes: 1 %
Lymphocytes Relative: 9 %
Lymphs Abs: 1.1 10*3/uL (ref 0.7–4.0)
MCH: 32 pg (ref 26.0–34.0)
MCHC: 31.5 g/dL (ref 30.0–36.0)
MCV: 101.6 fL — ABNORMAL HIGH (ref 80.0–100.0)
Monocytes Absolute: 1.5 10*3/uL — ABNORMAL HIGH (ref 0.1–1.0)
Monocytes Relative: 13 %
Neutro Abs: 8.6 10*3/uL — ABNORMAL HIGH (ref 1.7–7.7)
Neutrophils Relative %: 75 %
Platelets: 201 10*3/uL (ref 150–400)
RBC: 2.5 MIL/uL — ABNORMAL LOW (ref 3.87–5.11)
RDW: 12.4 % (ref 11.5–15.5)
WBC: 11.4 10*3/uL — ABNORMAL HIGH (ref 4.0–10.5)
nRBC: 0 % (ref 0.0–0.2)

## 2021-03-25 LAB — BASIC METABOLIC PANEL
Anion gap: 7 (ref 5–15)
BUN: 47 mg/dL — ABNORMAL HIGH (ref 8–23)
CO2: 21 mmol/L — ABNORMAL LOW (ref 22–32)
Calcium: 8 mg/dL — ABNORMAL LOW (ref 8.9–10.3)
Chloride: 112 mmol/L — ABNORMAL HIGH (ref 98–111)
Creatinine, Ser: 1.47 mg/dL — ABNORMAL HIGH (ref 0.44–1.00)
GFR, Estimated: 34 mL/min — ABNORMAL LOW (ref >60)
Glucose, Bld: 130 mg/dL — ABNORMAL HIGH (ref 70–99)
Potassium: 4 mmol/L (ref 3.5–5.1)
Sodium: 140 mmol/L (ref 135–145)

## 2021-03-25 NOTE — Plan of Care (Signed)
?  Problem: Clinical Measurements: ?Goal: Ability to maintain clinical measurements within normal limits will improve ?Outcome: Progressing ?Goal: Will remain free from infection ?Outcome: Progressing ?Goal: Diagnostic test results will improve ?Outcome: Progressing ?Goal: Respiratory complications will improve ?Outcome: Progressing ?Goal: Cardiovascular complication will be avoided ?Outcome: Progressing ?  ?Problem: Activity: ?Goal: Risk for activity intolerance will decrease ?Outcome: Progressing ?  ?Problem: Elimination: ?Goal: Will not experience complications related to bowel motility ?Outcome: Progressing ?Goal: Will not experience complications related to urinary retention ?Outcome: Progressing ?  ?Problem: Pain Managment: ?Goal: General experience of comfort will improve ?Outcome: Progressing ?  ?Problem: Safety: ?Goal: Ability to remain free from injury will improve ?Outcome: Progressing ?  ?Problem: Skin Integrity: ?Goal: Risk for impaired skin integrity will decrease ?Outcome: Progressing ?  ?

## 2021-03-25 NOTE — Progress Notes (Signed)
PROGRESS NOTE  Julie Martinez VJK:[id number] DOB: 06/14/1931 DOA: 03/21/2021 PCP: Cari Caraway, MD  HPI/Recap of past 24 hours: Julie Martinez is a 88 y.o. female with medical history significant for advanced dementia, DVT/PE on Eliquis, HTN, who presented from home after a fall.  Patient lives at home. History is obtained from her daughter who is also her medical POA via phone.  Per her daughter pt got out of bed took a few steps then fell. No LOC. She was in her usual state of health prior to this. In ED, VS fairly stable, lab studies remarkable for creatinine 1.43, baseline 0.9. Left hip x-ray shows subcapital hip fracture.  Orthopedic surgery, Dr. Sammuel Hines consulted. Patient is on Eliquis, allow for washout with surgical date 03/23/2021.  TR[facility], hospitalist service, was asked to admit.      Today, patient appeared somewhat comfortable, met patient sitting on a chair.  Oriented to self.     Assessment/Plan: Principal Problem:   Closed left hip fracture (HCC)   Left hip fracture on Eliquis post fall, POA Left pelvic x-ray showing pathologic subcapital left femoral neck fracture with mild varus angulation. Orthopedic surgery consulted, s/p left hip hemiarthroplasty on 03/23/2021 DVT PPx, pain management as per Ortho PT/OT- SNF   History of DVT/PE on Eliquis Restart Eliquis  DVT and PE were diagnosed 2 years ago BLE Doppler neg for DVT   AKI on CKD 3a, suspect prerenal in the setting of poor oral intake Improving Baseline creatinine appears to be 1.4 (upon further evaluation from care everywhere) Monitor urine output with strict I's and O's Daily BMP  Anemia of CKD Worsened by recent surgery Baseline hemoglobin around 11 Anemia panel pending Type and screen done  Leukocytosis Afebrile Likely reactive from recent surgery Daily CBC   Essential hypertension BP better control, likely worsened by pain Continue recently started Norvasc, IV hydralazine prn Continue to hold  home quinapril and HCTZ due to AKI (may consider discontinuing HCTZ upon discharge in this elderly patient)  Umbilical erythema Chronic problem Noted some redness, tenderness, minimal exudate within umbilicus WOC consulted, appreciate care   Advanced dementia Reorient as needed Delirium/aspiration/fall precautions PT OT to assess postsurgery with orthopedic surgery's guidance      Estimated body mass index is 25.4 kg/m as calculated from the following:   Height as of this encounter: 5' (1.524 m).   Weight as of this encounter: 59 kg.     Code Status: Full  Family Communication: Daughter at bedside  Disposition Plan: Status is: Inpatient Remains inpatient appropriate because: Level of care    Consultants: Orthopedics  Procedures: Left hip hemiarthroplasty  Antimicrobials: None  DVT prophylaxis: Eliquis   Objective: Vitals:   03/24/21 2255 03/25/21 0131 03/25/21 0412 03/25/21 0852  BP: (!) 121/58 (!) 122/50 (!) 131/42 (!) 149/74  Pulse: 92 84 90   Resp: '14 18 18   ' Temp: 97.6 F (36.4 C) 98.8 F (37.1 C) 98.3 F (36.8 C)   TempSrc: Oral Oral Oral   SpO2: 99% 95% 96%   Weight:      Height:        Intake/Output Summary (Last 24 hours) at 03/25/2021 1556 Last data filed at 03/25/2021 0900 Gross per 24 hour  Intake --  Output 121 ml  Net -121 ml   Filed Weights   03/21/21 0325 03/21/21 2236  Weight: 54.4 kg 59 kg    Exam: General: NAD  Cardiovascular: S1, S2 present Respiratory: CTAB Abdomen: Soft, nontender, nondistended, bowel  sounds present, some redness, tenderness, minimal exudate within umbilicus Musculoskeletal: No bilateral pedal edema noted, left hip dressing C/D/I Skin: As noted above, multiple bruises noted in extremities Psychiatry: Unable to assess    Data Reviewed: CBC: Recent Labs  Lab 03/21/21 0322 03/22/21 0210 03/23/21 0141 03/24/21 0220 03/25/21 0249  WBC 8.9 10.1 9.3 12.9* 11.4*  NEUTROABS 5.6  --  6.4 10.5* 8.6*   HGB 11.6* 11.1* 10.0* 9.1* 8.0*  HCT 36.9 35.0* 30.9* 28.1* 25.4*  MCV 101.9* 101.4* 102.3* 101.1* 101.6*  PLT 293 241 222 207 672   Basic Metabolic Panel: Recent Labs  Lab 03/21/21 0322 03/22/21 0210 03/23/21 0141 03/24/21 0220 03/25/21 0249  NA 143 140 140 139 140  K 3.9 4.5 4.5 4.7 4.0  CL 110 110 108 107 112*  CO2 '22 22 24 ' 21* 21*  GLUCOSE 131* 128* 100* 116* 130*  BUN 30* 32* 41* 46* 47*  CREATININE 1.40* 1.36* 1.66* 1.82* 1.47*  CALCIUM 8.8* 8.8* 8.6* 8.1* 8.0*  MG  --  2.0  --   --   --   PHOS  --  3.4  --   --   --    GFR: Estimated Creatinine Clearance: 21.3 mL/min (A) (by C-G formula based on SCr of 1.47 mg/dL (H)). Liver Function Tests: Recent Labs  Lab 03/21/21 0322 03/22/21 0210  AST 18  --   ALT 14  --   ALKPHOS 71  --   BILITOT 0.4  --   PROT 6.4*  --   ALBUMIN 3.4* 2.9*   No results for input(s): LIPASE, AMYLASE in the last 168 hours. No results for input(s): AMMONIA in the last 168 hours. Coagulation Profile: No results for input(s): INR, PROTIME in the last 168 hours. Cardiac Enzymes: No results for input(s): CKTOTAL, CKMB, CKMBINDEX, TROPONINI in the last 168 hours. BNP (last 3 results) No results for input(s): PROBNP in the last 8760 hours. HbA1C: No results for input(s): HGBA1C in the last 72 hours. CBG: No results for input(s): GLUCAP in the last 168 hours. Lipid Profile: No results for input(s): CHOL, HDL, LDLCALC, TRIG, CHOLHDL, LDLDIRECT in the last 72 hours. Thyroid Function Tests: No results for input(s): TSH, T4TOTAL, FREET4, T3FREE, THYROIDAB in the last 72 hours. Anemia Panel: No results for input(s): VITAMINB12, FOLATE, FERRITIN, TIBC, IRON, RETICCTPCT in the last 72 hours. Urine analysis:    Component Value Date/Time   COLORURINE YELLOW 03/22/2021 0130   APPEARANCEUR CLEAR 03/22/2021 0130   LABSPEC 1.017 03/22/2021 0130   PHURINE 5.0 03/22/2021 0130   GLUCOSEU NEGATIVE 03/22/2021 0130   HGBUR NEGATIVE 03/22/2021 0130    BILIRUBINUR NEGATIVE 03/22/2021 0130   KETONESUR NEGATIVE 03/22/2021 0130   PROTEINUR NEGATIVE 03/22/2021 0130   UROBILINOGEN 1.0 11/12/2014 1525   NITRITE NEGATIVE 03/22/2021 0130   LEUKOCYTESUR TRACE (A) 03/22/2021 0130   Sepsis Labs: '@LABRCNTIP' (procalcitonin:4,lacticidven:4)  ) Recent Results (from the past 240 hour(s))  Resp Panel by RT-PCR (Flu A&B, Covid) Nasopharyngeal Swab     Status: None   Collection Time: 03/21/21  4:26 AM   Specimen: Nasopharyngeal Swab; Nasopharyngeal(NP) swabs in vial transport medium  Result Value Ref Range Status   SARS Coronavirus 2 by RT PCR NEGATIVE NEGATIVE Final    Comment: (NOTE) SARS-CoV-2 target nucleic acids are NOT DETECTED.  The SARS-CoV-2 RNA is generally detectable in upper respiratory specimens during the acute phase of infection. The lowest concentration of SARS-CoV-2 viral copies this assay can detect is 138 copies/mL. A negative result does not  preclude SARS-Cov-2 infection and should not be used as the sole basis for treatment or other patient management decisions. A negative result may occur with  improper specimen collection/handling, submission of specimen other than nasopharyngeal swab, presence of viral mutation(s) within the areas targeted by this assay, and inadequate number of viral copies(<138 copies/mL). A negative result must be combined with clinical observations, patient history, and epidemiological information. The expected result is Negative.  Fact Sheet for Patients:  EntrepreneurPulse.com.au  Fact Sheet for Healthcare Providers:  IncredibleEmployment.be  This test is no t yet approved or cleared by the [place] FDA and  has been authorized for detection and/or diagnosis of SARS-CoV-2 by FDA under an Emergency Use Authorization (EUA). This EUA will remain  in effect (meaning this test can be used) for the duration of the COVID-19 declaration under Section 564(b)(1) of  the Act, 21 U.S.C.section 360bbb-3(b)(1), unless the authorization is terminated  or revoked sooner.       Influenza A by PCR NEGATIVE NEGATIVE Final   Influenza B by PCR NEGATIVE NEGATIVE Final    Comment: (NOTE) The Xpert Xpress SARS-CoV-2/FLU/RSV plus assay is intended as an aid in the diagnosis of influenza from Nasopharyngeal swab specimens and should not be used as a sole basis for treatment. Nasal washings and aspirates are unacceptable for Xpert Xpress SARS-CoV-2/FLU/RSV testing.  Fact Sheet for Patients: EntrepreneurPulse.com.au  Fact Sheet for Healthcare Providers: IncredibleEmployment.be  This test is not yet approved or cleared by the [place] FDA and has been authorized for detection and/or diagnosis of SARS-CoV-2 by FDA under an Emergency Use Authorization (EUA). This EUA will remain in effect (meaning this test can be used) for the duration of the COVID-19 declaration under Section 564(b)(1) of the Act, 21 U.S.C. section 360bbb-3(b)(1), unless the authorization is terminated or revoked.  Performed at [facility], [address], [place], [place] [address]   Surgical pcr screen     Status: Abnormal   Collection Time: 03/23/21  6:58 AM   Specimen: Nasal Mucosa; Nasal Swab  Result Value Ref Range Status   MRSA, PCR NEGATIVE NEGATIVE Final   Staphylococcus aureus POSITIVE (A) NEGATIVE Final    Comment: (NOTE) The Xpert SA Assay (FDA approved for NASAL specimens in patients 85 years of age and older), is one component of a comprehensive surveillance program. It is not intended to diagnose infection nor to guide or monitor treatment. Performed at [facility], [address], [place], [place] [address]       Studies: No results found.  Scheduled Meds:  sodium chloride   Intravenous Once   amLODipine  5 mg Oral Daily   apixaban  2.5 mg Oral BID   docusate sodium  100 mg Oral BID   folic  acid  1 mg Oral Daily   melatonin  3 mg Oral QHS   mupirocin cream   Topical BID   vitamin B-12  250 mcg Oral Daily    Continuous Infusions:     LOS: 4 days     Alma Friendly, MD [facility]  If 7PM-7AM, please contact night-coverage [email or web address] 03/25/2021, 3:56 PM

## 2021-03-25 NOTE — Plan of Care (Addendum)
Notified Dr. Gerhard Perches about increase in swelling to the left thigh, I have applied ice, sensation is good, temp is good no tenderness in calf, just pain in the hip.  ? ?Transferred patient from bed to BCS and chair. Patient unable to move well due to pain and cognition. Patient extremely anxious when moving back and for. 2 assist.  ? ?Problem: Education: ?Goal: Knowledge of General Education information will improve ?Description: Including pain rating scale, medication(s)/side effects and non-pharmacologic comfort measures ?Outcome: Progressing ?  ?Problem: Activity: ?Goal: Risk for activity intolerance will decrease ?Outcome: Progressing ?  ?Problem: Pain Managment: ?Goal: General experience of comfort will improve ?Outcome: Progressing ?  ?Problem: Safety: ?Goal: Ability to remain free from injury will improve ?Outcome: Progressing ?  ?Problem: Skin Integrity: ?Goal: Risk for impaired skin integrity will decrease ?Outcome: Progressing ?  ?

## 2021-03-25 NOTE — Progress Notes (Signed)
?  Subjective: ?Julie Martinez is a 88 y.o. female s/p left hip hemiarthroplasty.  They are POD 2.  Pt's pain is controlled.  Patient was able to transfer from the bed to the bedside chair earlier today.  Had a fair amount of pain with the transfer but she is okay as long as she is laying still.  She denies any fevers, chills, chest pain, shortness of breath.  She is accompanied by her family member in the room. ? ?Objective: ?Vital signs in last 24 hours: ?Temp:  [97.6 ?F (36.4 ?C)-99.1 ?F (37.3 ?C)] 98.6 ?F (37 ?C) (03/06 0758) ?Pulse Rate:  [69-83] 73 (03/06 0405) ?Resp:  [14-18] 16 (03/06 0758) ?BP: (109-133)/(41-58) 133/53 (03/06 1034) ?SpO2:  [94 %-99 %] 97 % (03/06 0405) ? ?Intake/Output from previous day: ?03/05 0701 - 03/06 0700 ?In: 1908.4 [P.O.:50; I.V.:1658.4; IV Piggyback:200] ?Out: 810 [Urine:710; Blood:100] ?Intake/Output this shift: ?No intake/output data recorded. ? ?Exam: ? ?No gross blood or drainage overlying the dressing ?1+ DP pulse ?Sensation intact distally in the left foot ?Able to dorsiflex and plantarflex the left foot.  EHL fires. ?No significant calf tenderness.  Negative Homans' sign. ? ? ?Labs: ?Recent Labs  ?  03/22/21 ?0210 03/23/21 ?0141 03/24/21 ?0220  ?HGB 11.1* 10.0* 9.1*  ? ?Recent Labs  ?  03/23/21 ?0141 03/24/21 ?0220  ?WBC 9.3 12.9*  ?RBC 3.02* 2.78*  ?HCT 30.9* 28.1*  ?PLT 222 207  ? ?Recent Labs  ?  03/23/21 ?0141 03/24/21 ?0220  ?NA 140 139  ?K 4.5 4.7  ?CL 108 107  ?CO2 24 21*  ?BUN 41* 46*  ?CREATININE 1.66* 1.82*  ?GLUCOSE 100* 116*  ?CALCIUM 8.6* 8.1*  ? ?No results for input(s): LABPT, INR in the last 72 hours. ? ?Assessment/Plan: ?Pt is POD 2 s/p left hip hemiarthroplasty.   ? -Mobility is progressing with PT slowly.  Pain is controlled overall.  Questions answered to family member satisfaction. ? -Disposition pending medical clearance with likely discharge to SNF.  Appreciate management by the hospitalist team. ? -WBAT with a walker ? -Eliquis and SCDs for DVT  prophylaxis ? -Follow-up with Dr. Marlou Sa 2 weeks postoperatively ?  ? ? ?Julie Martinez ?03/24/2021, 12:50 PM  ? ? ?   ?

## 2021-03-25 NOTE — TOC Progression Note (Addendum)
Transition of Care (TOC) - Progression Note  ? ? ?Patient Details  ?Name: Julie Martinez ?MRN: [id number] ?Date of Birth: 03-11-1931 ? ?Transition of Care (TOC) CM/SW Contact  ?Joanne Chars, LCSW ?Phone Number: ?03/25/2021, 2:48 PM ? ?Clinical Narrative:   CSW LM with daughter [name] regarding bed offers. ?CSW spoke with [facility] and they do not manage this [place] for SNF auth.  ? ?1315: spoke with pt daughter in room, presented bed offers, she would like [facility].  Reached out to both facilities.  ? ?Expected Discharge Plan: [place] ?Barriers to Discharge: Continued Medical Work up, SNF Pending bed offer ? ?Expected Discharge Plan and Services ?Expected Discharge Plan: [place] ?In-house Referral: Clinical Social Work ?  ?  ?Living arrangements for the past 2 months: [place] ?                ?  ?  ?  ?  ?  ?  ?  ?  ?  ?  ? ? ?Social Determinants of Health (SDOH) Interventions ?  ? ?Readmission Risk Interventions ?No flowsheet data found. ? ?

## 2021-03-25 NOTE — Progress Notes (Signed)
The patient is injury-free, afebrile, alert, and oriented X 1. Pt's had a short period of sinus tachycardia with pain and confusion, but improved with medications. Her surgical pain well controlled with current pain regimen. Pt had 3 occurrences of urination. Pt denies chest pain, SOB, nausea, vomiting, dizziness, signs or symptoms of bleeding or infection, or acute changes during this shift. We will continue to monitor and work toward achieving the care plan goals. ?

## 2021-03-26 DIAGNOSIS — D62 Acute posthemorrhagic anemia: Secondary | ICD-10-CM

## 2021-03-26 DIAGNOSIS — Z86718 Personal history of other venous thrombosis and embolism: Secondary | ICD-10-CM

## 2021-03-26 DIAGNOSIS — N1832 Chronic kidney disease, stage 3b: Secondary | ICD-10-CM

## 2021-03-26 DIAGNOSIS — S72002A Fracture of unspecified part of neck of left femur, initial encounter for closed fracture: Secondary | ICD-10-CM | POA: Diagnosis not present

## 2021-03-26 DIAGNOSIS — N1831 Chronic kidney disease, stage 3a: Secondary | ICD-10-CM

## 2021-03-26 LAB — CBC WITH DIFFERENTIAL/PLATELET
Abs Immature Granulocytes: 0.05 10*3/uL (ref 0.00–0.07)
Basophils Absolute: 0.1 10*3/uL (ref 0.0–0.1)
Basophils Relative: 1 %
Eosinophils Absolute: 0.5 10*3/uL (ref 0.0–0.5)
Eosinophils Relative: 6 %
HCT: 24.6 % — ABNORMAL LOW (ref 36.0–46.0)
Hemoglobin: 7.8 g/dL — ABNORMAL LOW (ref 12.0–15.0)
Immature Granulocytes: 1 %
Lymphocytes Relative: 9 %
Lymphs Abs: 0.9 10*3/uL (ref 0.7–4.0)
MCH: 33.1 pg (ref 26.0–34.0)
MCHC: 31.7 g/dL (ref 30.0–36.0)
MCV: 104.2 fL — ABNORMAL HIGH (ref 80.0–100.0)
Monocytes Absolute: 1 10*3/uL (ref 0.1–1.0)
Monocytes Relative: 11 %
Neutro Abs: 7 10*3/uL (ref 1.7–7.7)
Neutrophils Relative %: 72 %
Platelets: 221 10*3/uL (ref 150–400)
RBC: 2.36 MIL/uL — ABNORMAL LOW (ref 3.87–5.11)
RDW: 12.8 % (ref 11.5–15.5)
WBC: 9.5 10*3/uL (ref 4.0–10.5)
nRBC: 0 % (ref 0.0–0.2)

## 2021-03-26 LAB — FERRITIN: Ferritin: 80 ng/mL (ref 11–307)

## 2021-03-26 LAB — BASIC METABOLIC PANEL
Anion gap: 8 (ref 5–15)
BUN: 43 mg/dL — ABNORMAL HIGH (ref 8–23)
CO2: 21 mmol/L — ABNORMAL LOW (ref 22–32)
Calcium: 8 mg/dL — ABNORMAL LOW (ref 8.9–10.3)
Chloride: 115 mmol/L — ABNORMAL HIGH (ref 98–111)
Creatinine, Ser: 1.57 mg/dL — ABNORMAL HIGH (ref 0.44–1.00)
GFR, Estimated: 32 mL/min — ABNORMAL LOW (ref >60)
Glucose, Bld: 106 mg/dL — ABNORMAL HIGH (ref 70–99)
Potassium: 4.4 mmol/L (ref 3.5–5.1)
Sodium: 144 mmol/L (ref 135–145)

## 2021-03-26 LAB — IRON AND TIBC
Iron: 12 ug/dL — ABNORMAL LOW (ref 28–170)
Saturation Ratios: 5 % — ABNORMAL LOW (ref 10.4–31.8)
TIBC: 224 ug/dL — ABNORMAL LOW (ref 250–450)
UIBC: 212 ug/dL

## 2021-03-26 LAB — FOLATE: Folate: 18.3 ng/mL (ref >5.9)

## 2021-03-26 LAB — VITAMIN B12: Vitamin B-12: 373 pg/mL (ref 180–914)

## 2021-03-26 MED ORDER — SODIUM CHLORIDE 0.9 % IV SOLN
510.0000 mg | Freq: Once | INTRAVENOUS | Status: AC
  Administered 2021-03-26: 510 mg via INTRAVENOUS
  Filled 2021-03-26: qty 17

## 2021-03-26 MED ORDER — FERROUS SULFATE 325 (65 FE) MG PO TABS
325.0000 mg | ORAL_TABLET | Freq: Every day | ORAL | Status: DC
  Administered 2021-03-27: 11:00:00 325 mg via ORAL
  Filled 2021-03-26: qty 1

## 2021-03-26 NOTE — TOC Progression Note (Signed)
Transition of Care (TOC) - Progression Note  ? ? ?Patient Details  ?Name: Julie Martinez ?MRN: [id number] ?Date of Birth: 1931-03-26 ? ?Transition of Care (TOC) CM/SW Contact  ?Joanne Chars, LCSW ?Phone Number: ?03/26/2021, 2:54 PM ? ?Clinical Narrative:    ?CSW spoke with [facility], cannot accept pt due to out of network with [employer]. ?CSW made multiple attempts to contact [facility] but unable to get a response. ? ?1450: CSW spoke with pt daughter [name], updated her, she would like to chose [facility].  CSW confirmed bed space with [facility], Perrin Smack will initiate insurance auth request.   ? ? ?Expected Discharge Plan: [place] ?Barriers to Discharge: Continued Medical Work up, SNF Pending bed offer ? ?Expected Discharge Plan and Services ?Expected Discharge Plan: [place] ?In-house Referral: Clinical Social Work ?  ?  ?Living arrangements for the past 2 months: [place] ?                ?  ?  ?  ?  ?  ?  ?  ?  ?  ?  ? ? ?Social Determinants of Health (SDOH) Interventions ?  ? ?Readmission Risk Interventions ?No flowsheet data found. ? ?

## 2021-03-26 NOTE — Assessment & Plan Note (Signed)
Baseline creatinine 1.4.  Presented with AKI due to poor oral intake.  Improving. ?

## 2021-03-26 NOTE — Assessment & Plan Note (Signed)
Reorient as needed.  Delirium precautions. ?

## 2021-03-26 NOTE — Assessment & Plan Note (Addendum)
Hemoglobin in the range  of 7.  Most likely associated with surgery.  Iron panel shows severe iron deficiency.  Supplemented with IV iron.  She needs to continue oral iron on discharge.  We will continue to monitor H&H.  We will transfuse if hemoglobin drops less than 7. ?

## 2021-03-26 NOTE — Assessment & Plan Note (Addendum)
Left pelvic x-ray showing pathologic subcapital left femoral neck fracture with mild varus angulation. ?Orthopedic surgery consulted, s/p left hip hemiarthroplasty on 03/23/2021 ?DVT PPx with eliquis, pain management  ?PT/OT- SNF.  Waiting for bed ?

## 2021-03-26 NOTE — Progress Notes (Signed)
PROGRESS NOTE  AMBRIANA Martinez  VWP:[id number] DOB: Oct 03, 1931 DOA: 03/21/2021 PCP: Cari Caraway, MD   Brief Narrative: Julie Martinez is a 88 y.o. female with medical history significant for advanced dementia, DVT/PE on Eliquis, HTN, who presented from home after a fall.  Patient lives at home.  Per her daughter pt got out of bed took a few steps then fell. No LOC. She was in her usual state of health prior to this.Left hip x-ray shows subcapital hip fracture.  Status post left hip hemiarthroplasty.  PT/OT recommending skilled nursing facility on discharge.  Medically stable for discharge as soon as bed is available.    Assessment & Plan:  Principal Problem:   Closed left hip fracture ([place]) Active Problems:   Acute postoperative anemia due to expected blood loss   CKD stage G3a/A3, GFR 45-59 and albumin creatinine ratio >300 mg/g (HCC)   Dementia (HCC)   Personal history of DVT (deep vein thrombosis)   Assessment and Plan: * Closed left hip fracture (HCC) Left pelvic x-ray showing pathologic subcapital left femoral neck fracture with mild varus angulation. Orthopedic surgery consulted, s/p left hip hemiarthroplasty on 03/23/2021 DVT PPx with eliquis, pain management  PT/OT- SNF.  Waiting for bed  Acute postoperative anemia due to expected blood loss Hemoglobin in the range  of 7.  Most likely associated with surgery.  Iron panel shows severe iron deficiency.  Supplemented with IV iron.  She needs to continue oral iron on discharge.  We will continue to monitor H&H.  We will transfuse if hemoglobin drops less than 7.  CKD stage G3a/A3, GFR 45-59 and albumin creatinine ratio >300 mg/g (HCC) Baseline creatinine 1.4.  Presented with AKI due to poor oral intake.  Improving.  Personal history of DVT (deep vein thrombosis) Also history of PE.  On Eliquis.  Essential hypertension Blood pressure high likely secondary to pain.  Continue Norvasc, IV hydralazine.  Home quinapril and  hydrochlorothiazide on hold due to AKI.  Dementia ([place]) Reorient as needed.  Delirium precautions.          DVT prophylaxis:apixaban (ELIQUIS) tablet 2.5 mg Start: 03/24/21 1030 SCDs Start: 03/23/21 1237 Place TED hose Start: 03/23/21 1237 SCDs Start: 03/21/21 2334 apixaban (ELIQUIS) tablet 2.5 mg     Code Status: Full Code  Family Communication: None at bedside  Patient status:Inpatient  Patient is from :Home  Anticipated discharge to:SNF  Estimated DC date: As soon as bed is available   Consultants: Orthopedics  Procedures:ORIF  Antimicrobials:  Anti-infectives (From admission, onward)    Start     Dose/Rate Route Frequency Ordered Stop   03/23/21 2000  ceFAZolin (ANCEF) IVPB 2g/100 mL premix        2 g 200 mL/hr over 30 Minutes Intravenous  Once 03/23/21 1236 03/23/21 2050   03/23/21 0928  vancomycin (VANCOCIN) powder  Status:  Discontinued          As needed 03/23/21 0928 03/23/21 1049   03/23/21 0702  ceFAZolin (ANCEF) 2-4 GM/100ML-% IVPB       Note to Pharmacy: Nyoka Cowden D: cabinet override      03/23/21 0702 03/23/21 0814   03/23/21 0700  ceFAZolin (ANCEF) IVPB 2g/100 mL premix        2 g 200 mL/hr over 30 Minutes Intravenous On call to O.R. 03/23/21 3748 03/23/21 0810       Subjective:  Patient seen and examined at the bedside this morning.  Hemodynamically stable.  Eating her breakfast.  Alert  and awake but confused.  Not in any kind of distress  Objective: Vitals:   03/25/21 0412 03/25/21 0852 03/25/21 2021 03/26/21 0736  BP: (!) 131/42 (!) 149/74 124/63 (!) 144/54  Pulse: 90  94 95  Resp: '18  14 16  '$ Temp: 98.3 F (36.8 C)  99 F (37.2 C) 99.1 F (37.3 C)  TempSrc: Oral   Oral  SpO2: 96%  99% 97%  Weight:      Height:        Intake/Output Summary (Last 24 hours) at 03/26/2021 4481 Last data filed at 03/25/2021 0900 Gross per 24 hour  Intake --  Output 1 ml  Net -1 ml   Filed Weights   03/21/21 0325 03/21/21 2236  Weight:  54.4 kg 59 kg    Examination:  General exam: Overall comfortable, not in distress, deconditioned elderly female HEENT: PERRL Respiratory system:  no wheezes or crackles  Cardiovascular system: S1 & S2 heard, RRR.  Gastrointestinal system: Abdomen is nondistended, soft and nontender. Central nervous system: Alert and awake but not oriented Extremities: No edema, no clubbing ,no cyanosis, clean surgical wound on the left hip Skin: No rashes, no ulcers,no icterus     Data Reviewed: I have personally reviewed following labs and imaging studies  CBC: Recent Labs  Lab 03/21/21 0322 03/22/21 0210 03/23/21 0141 03/24/21 0220 03/25/21 0249 03/26/21 0401  WBC 8.9 10.1 9.3 12.9* 11.4* 9.5  NEUTROABS 5.6  --  6.4 10.5* 8.6* 7.0  HGB 11.6* 11.1* 10.0* 9.1* 8.0* 7.8*  HCT 36.9 35.0* 30.9* 28.1* 25.4* 24.6*  MCV 101.9* 101.4* 102.3* 101.1* 101.6* 104.2*  PLT 293 241 222 207 201 856   Basic Metabolic Panel: Recent Labs  Lab 03/22/21 0210 03/23/21 0141 03/24/21 0220 03/25/21 0249 03/26/21 0401  NA 140 140 139 140 144  K 4.5 4.5 4.7 4.0 4.4  CL 110 108 107 112* 115*  CO2 22 24 21* 21* 21*  GLUCOSE 128* 100* 116* 130* 106*  BUN 32* 41* 46* 47* 43*  CREATININE 1.36* 1.66* 1.82* 1.47* 1.57*  CALCIUM 8.8* 8.6* 8.1* 8.0* 8.0*  MG 2.0  --   --   --   --   PHOS 3.4  --   --   --   --      Recent Results (from the past 240 hour(s))  Resp Panel by RT-PCR (Flu A&B, Covid) Nasopharyngeal Swab     Status: None   Collection Time: 03/21/21  4:26 AM   Specimen: Nasopharyngeal Swab; Nasopharyngeal(NP) swabs in vial transport medium  Result Value Ref Range Status   SARS Coronavirus 2 by RT PCR NEGATIVE NEGATIVE Final    Comment: (NOTE) SARS-CoV-2 target nucleic acids are NOT DETECTED.  The SARS-CoV-2 RNA is generally detectable in upper respiratory specimens during the acute phase of infection. The lowest concentration of SARS-CoV-2 viral copies this assay can detect is 138 copies/mL.  A negative result does not preclude SARS-Cov-2 infection and should not be used as the sole basis for treatment or other patient management decisions. A negative result may occur with  improper specimen collection/handling, submission of specimen other than nasopharyngeal swab, presence of viral mutation(s) within the areas targeted by this assay, and inadequate number of viral copies(<138 copies/mL). A negative result must be combined with clinical observations, patient history, and epidemiological information. The expected result is Negative.  Fact Sheet for Patients:  EntrepreneurPulse.com.au  Fact Sheet for Healthcare Providers:  IncredibleEmployment.be  This test is no t yet approved or  cleared by the [place] and  has been authorized for detection and/or diagnosis of SARS-CoV-2 by FDA under an Emergency Use Authorization (EUA). This EUA will remain  in effect (meaning this test can be used) for the duration of the COVID-19 declaration under Section 564(b)(1) of the Act, 21 U.S.C.section 360bbb-3(b)(1), unless the authorization is terminated  or revoked sooner.       Influenza A by PCR NEGATIVE NEGATIVE Final   Influenza B by PCR NEGATIVE NEGATIVE Final    Comment: (NOTE) The Xpert Xpress SARS-CoV-2/FLU/RSV plus assay is intended as an aid in the diagnosis of influenza from Nasopharyngeal swab specimens and should not be used as a sole basis for treatment. Nasal washings and aspirates are unacceptable for Xpert Xpress SARS-CoV-2/FLU/RSV testing.  Fact Sheet for Patients: EntrepreneurPulse.com.au  Fact Sheet for Healthcare Providers: IncredibleEmployment.be  This test is not yet approved or cleared by the [place] FDA and has been authorized for detection and/or diagnosis of SARS-CoV-2 by FDA under an Emergency Use Authorization (EUA). This EUA will remain in effect (meaning this test  can be used) for the duration of the COVID-19 declaration under Section 564(b)(1) of the Act, 21 U.S.C. section 360bbb-3(b)(1), unless the authorization is terminated or revoked.  Performed at [facility], [address], [place], [place] [address]   Surgical pcr screen     Status: Abnormal   Collection Time: 03/23/21  6:58 AM   Specimen: Nasal Mucosa; Nasal Swab  Result Value Ref Range Status   MRSA, PCR NEGATIVE NEGATIVE Final   Staphylococcus aureus POSITIVE (A) NEGATIVE Final    Comment: (NOTE) The Xpert SA Assay (FDA approved for NASAL specimens in patients 86 years of age and older), is one component of a comprehensive surveillance program. It is not intended to diagnose infection nor to guide or monitor treatment. Performed at [facility], [place] [address] [place], [place] [address]      Radiology Studies: No results found.  Scheduled Meds:  sodium chloride   Intravenous Once   amLODipine  5 mg Oral Daily   apixaban  2.5 mg Oral BID   docusate sodium  100 mg Oral BID   folic acid  1 mg Oral Daily   melatonin  3 mg Oral QHS   mupirocin cream   Topical BID   vitamin B-12  250 mcg Oral Daily   Continuous Infusions:  ferumoxytol       LOS: 5 days   Shelly Coss, MD [facility] P3/08/2021, 8:32 AM

## 2021-03-26 NOTE — Progress Notes (Signed)
Physical Therapy Treatment ?Patient Details ?Name: Julie Martinez ?MRN: [id number] ?DOB: May 04, 1931 ?Today's Date: 03/26/2021 ? ? ?History of Present Illness 88 yo female presents to [facility] on 3/3 with fall OOB, found by grandson, sustained L femoral neck fx. s/p L hip hemiarthroplasty on 3/5. PMH includes dementia, DVT/PE, HTN, osteopenia. ? ?  ?PT Comments  ? ? Pt received in supine and agreeable to session with emphasis on LE therex for increased strength and ROM. Pt making slow progress towards goals requiring max assist to transfer to BSC<>bed. Pt needing max verbal cues throughout for explaining source of pain and reinforcing purpose of visit. Pt responding better when ask to show PTA how she can move as opposed to directing pt in movement. Pt with fair tolerance for LE therex, although in noted pain. Current plan remains appropriate to address deficits and maximize functional independence and decrease caregiver burden. Pt continues to benefit from skilled PT services to progress toward functional mobility goals.  ?  ?Recommendations for follow up therapy are one component of a multi-disciplinary discharge planning process, led by the attending physician.  Recommendations may be updated based on patient status, additional functional criteria and insurance authorization. ? ?Follow Up Recommendations ? Skilled nursing-short term rehab (<3 hours/day) ?  ?  ?Assistance Recommended at Discharge Frequent or constant Supervision/Assistance  ?Patient can return home with the following A lot of help with bathing/dressing/bathroom;Two people to help with walking and/or transfers;Direct supervision/assist for medications management;Direct supervision/assist for financial management;Assist for transportation;Help with stairs or ramp for entrance ?  ?Equipment Recommendations ? None recommended by PT  ?  ?Recommendations for Other Services   ? ? ?  ?Precautions / Restrictions Precautions ?Precautions: Fall ?Restrictions ?Weight  Bearing Restrictions: Yes ?LLE Weight Bearing: Weight bearing as tolerated  ?  ? ?Mobility ? Bed Mobility ?Overal bed mobility: Needs Assistance ?Bed Mobility: Supine to Sit, Sit to Supine ?  ?  ?Supine to sit: +2 for physical assistance, Max assist, Mod assist ?Sit to supine: Max assist, +2 for physical assistance, Mod assist ?  ?General bed mobility comments: assist for trunk elevation/LE movement to EOB ?  ? ?Transfers ?Overall transfer level: Needs assistance ?Equipment used: 1 person hand held assist ?Transfers: Bed to chair/wheelchair/BSC ?  ?Stand pivot transfers: Max assist ?  ?Squat pivot transfers: Total assist ?  ?  ?General transfer comment: unable to bear weight in BLE during STS transfer attempts, pt stating need for [facility], able to stand/pivot with max/total assist bed<>BSC ?  ? ?Ambulation/Gait ?  ?  ?  ?  ?  ?  ?  ?  ? ? ?Stairs ?  ?  ?  ?  ?  ? ? ?Wheelchair Mobility ?  ? ?Modified Rankin (Stroke Patients Only) ?  ? ? ?  ?Balance Overall balance assessment: Needs assistance ?Sitting-balance support: Feet supported, Bilateral upper extremity supported ?Sitting balance-Leahy Scale: Fair ?Sitting balance - Comments: sits unsupported EOB without LOB ?  ?Standing balance support: Bilateral upper extremity supported ?Standing balance-Leahy Scale: Zero ?Standing balance comment: max A +2 ?  ?  ?  ?  ?  ?  ?  ?  ?  ?  ?  ?  ? ?  ?Cognition Arousal/Alertness: Awake/alert ?Behavior During Therapy: Restless ?Overall Cognitive Status: History of cognitive impairments - at baseline ?  ?  ?  ?  ?  ?  ?  ?  ?  ?  ?  ?  ?  ?  ?  ?  ?  General Comments: history of advanced dementia, requires frequent reminders that pain is from hip fx surgery ?  ?  ? ?  ?Exercises General Exercises - Lower Extremity ?Ankle Circles/Pumps: AROM, Both, 20 reps, Seated ?Long Arc Quad: AROM, AAROM, 20 reps, Seated ?Hip Flexion/Marching: AROM, AAROM, Both, 20 reps, Seated ? ?  ?General Comments General comments (skin integrity, edema, etc.):  Cues needed to slow breathing during session, pt frequently hyperventilating ?  ?  ? ?Pertinent Vitals/Pain Pain Assessment ?Pain Assessment: Faces ?Faces Pain Scale: Hurts worst ?Pain Location: L hip, during mobility ?Pain Descriptors / Indicators: Discomfort, Grimacing, Guarding, Sore, Crying ?Pain Intervention(s): Limited activity within patient's tolerance, Monitored during session, Repositioned  ? ? ?Home Living   ?  ?  ?  ?  ?  ?  ?  ?  ?  ?   ?  ?Prior Function    ?  ?  ?   ? ?PT Goals (current goals can now be found in the care plan section) Acute Rehab PT Goals ?PT Goal Formulation: With patient ?Time For Goal Achievement: 04/07/21 ? ?  ?Frequency ? ? ? Min 3X/week ? ? ? ?  ?PT Plan    ? ? ?Co-evaluation   ?  ?  ?  ?  ? ?  ?AM-PAC PT "6 Clicks" Mobility   ?Outcome Measure ? Help needed turning from your back to your side while in a flat bed without using bedrails?: A Lot ?Help needed moving from lying on your back to sitting on the side of a flat bed without using bedrails?: Total ?Help needed moving to and from a bed to a chair (including a wheelchair)?: Total ?Help needed standing up from a chair using your arms (e.g., wheelchair or bedside chair)?: Total ?Help needed to walk in hospital room?: Total ?Help needed climbing 3-5 steps with a railing? : Total ?6 Click Score: 7 ? ?  ?End of Session Equipment Utilized During Treatment: Gait belt ?Activity Tolerance: Patient limited by pain ?Patient left: in bed;with call bell/phone within reach;with bed alarm set ?Nurse Communication: Mobility status ?PT Visit Diagnosis: Other abnormalities of gait and mobility (R26.89);Pain ?Pain - Right/Left: Left ?Pain - part of body: Hip ?  ? ? ?Time: 0093-8182 ?PT Time Calculation (min) (ACUTE ONLY): 25 min ? ?Charges:  $Therapeutic Exercise: 8-22 mins ?$Therapeutic Activity: 8-22 mins          ?          ? ?Audry Riles. PTA ?Acute Rehabilitation Services ?Office: [phone] ? ? ? ?Julie Martinez ?03/26/2021, 12:22 PM ? ?

## 2021-03-26 NOTE — Assessment & Plan Note (Addendum)
Also history of PE.  On Eliquis. ?

## 2021-03-26 NOTE — Progress Notes (Signed)
Occupational Therapy Treatment ?Patient Details ?Name: Julie Martinez ?MRN: [id number] ?DOB: 1931-05-22 ?Today's Date: 03/26/2021 ? ? ?History of present illness 88 yo female presents to [facility] on 3/3 with fall OOB, found by grandson, sustained L femoral neck fx. s/p L hip hemiarthroplasty on 3/5. PMH includes dementia, DVT/PE, HTN, osteopenia. ?  ?OT comments ? Pt making slow progress towards goals, pt min A for seated grooming task and UB dressing sitting EOB, max A -max A+2 for bed mobility and sit to stand transfer attempts. Pt unable to bear weight through BLE with standing attempts, therefore further transfers deferred. Pt still crying in pain during session despite premedication, pt requiring verbal and tactile cues to slow breathing as pt with high respiration rate during session. Pt presenting with impairments listed below, will follow acutely. Continue to recommend SNF at d/c.  ? ?Recommendations for follow up therapy are one component of a multi-disciplinary discharge planning process, led by the attending physician.  Recommendations may be updated based on patient status, additional functional criteria and insurance authorization. ?   ?Follow Up Recommendations ? Skilled nursing-short term rehab (<3 hours/day)  ?  ?Assistance Recommended at Discharge Intermittent Supervision/Assistance  ?Patient can return home with the following ? Two people to help with walking and/or transfers;A lot of help with bathing/dressing/bathroom;Assistance with cooking/housework;Help with stairs or ramp for entrance ?  ?Equipment Recommendations ? None recommended by OT;Other (comment) (defer to next venue of care)  ?  ?Recommendations for Other Services   ? ?  ?Precautions / Restrictions Precautions ?Precautions: Fall ?Restrictions ?Weight Bearing Restrictions: Yes ?LLE Weight Bearing: Weight bearing as tolerated  ? ? ?  ? ?Mobility Bed Mobility ?Overal bed mobility: Needs Assistance ?Bed Mobility: Supine to Sit, Sit to Supine ?   ?  ?Supine to sit: +2 for physical assistance, Max assist, Mod assist ?Sit to supine: Max assist, +2 for physical assistance, Mod assist ?  ?General bed mobility comments: assist for trunk elevation/LE movement to EOB ?  ? ?Transfers ?Overall transfer level: Needs assistance ?Equipment used: 2 person hand held assist ?Transfers: Sit to/from Stand ?Sit to Stand: Max assist, +2 physical assistance ?  ?  ?  ?  ?  ?General transfer comment: unable to bear weight in BLE during STS transfer attempts x2 ?  ?  ?Balance Overall balance assessment: Needs assistance ?Sitting-balance support: Feet supported, Bilateral upper extremity supported ?Sitting balance-Leahy Scale: Fair ?Sitting balance - Comments: sits unsupported EOB without LOB ?  ?Standing balance support: Bilateral upper extremity supported ?Standing balance-Leahy Scale: Zero ?Standing balance comment: max A +2 ?  ?  ?  ?  ?  ?  ?  ?  ?  ?  ?  ?   ? ?ADL either performed or assessed with clinical judgement  ? ?ADL Overall ADL's : Needs assistance/impaired ?  ?  ?Grooming: Minimal assistance;Wash/dry hands;Wash/dry face;Sitting ?Grooming Details (indicate cue type and reason): completes grooming sitting EOB ?  ?  ?  ?  ?Upper Body Dressing : Minimal assistance;Sitting ?Upper Body Dressing Details (indicate cue type and reason): donned new gown sitting EOB ?  ?  ?  ?  ?  ?  ?  ?  ?Functional mobility during ADLs: Maximal assistance;+2 for physical assistance ?  ?  ? ?Extremity/Trunk Assessment Upper Extremity Assessment ?Upper Extremity Assessment: Generalized weakness ?  ?Lower Extremity Assessment ?Lower Extremity Assessment: Defer to PT evaluation ?  ?  ?  ? ?Vision   ?Vision Assessment?: No apparent visual deficits ?  ?  Perception Perception ?Perception: Not tested ?  ?Praxis Praxis ?Praxis: Not tested ?  ? ?Cognition Arousal/Alertness: Awake/alert ?Behavior During Therapy: Restless ?Overall Cognitive Status: History of cognitive impairments - at baseline ?  ?  ?   ?  ?  ?  ?  ?  ?  ?  ?  ?  ?  ?  ?  ?  ?General Comments: history of advanced dementia, requires frequent reminders that pain is from hip fx surgery ?  ?  ?   ?Exercises   ? ?  ?Shoulder Instructions   ? ? ?  ?General Comments Cues needed to slow breathing during session, pt frequently hyperventilating  ? ? ?Pertinent Vitals/ Pain       Pain Assessment ?Pain Assessment: Faces ?Pain Score: 10-Worst pain ever ?Faces Pain Scale: Hurts worst ?Pain Location: L hip, during mobility ?Pain Descriptors / Indicators: Discomfort, Grimacing, Guarding, Sore, Crying ?Pain Intervention(s): Limited activity within patient's tolerance, Monitored during session, Repositioned ? ?Home Living   ?  ?  ?  ?  ?  ?  ?  ?  ?  ?  ?  ?  ?  ?  ?  ?  ?  ?  ? ?  ?Prior Functioning/Environment    ?  ?  ?  ?   ? ?Frequency ? Min 2X/week  ? ? ? ? ?  ?Progress Toward Goals ? ?OT Goals(current goals can now be found in the care plan section) ? Progress towards OT goals: Progressing toward goals ? ?Acute Rehab OT Goals ?Patient Stated Goal: none stated ?OT Goal Formulation: With patient ?Time For Goal Achievement: 04/07/21 ?Potential to Achieve Goals: Fair ?ADL Goals ?Pt Will Perform Grooming: with min guard assist;sitting ?Pt Will Perform Upper Body Dressing: with min guard assist;sitting ?Pt Will Perform Lower Body Dressing: with mod assist;sitting/lateral leans;sit to/from stand ?Pt Will Transfer to Toilet: with min assist;stand pivot transfer;bedside commode;ambulating  ?Plan Discharge plan remains appropriate;Frequency remains appropriate   ? ?Co-evaluation ? ? ?   ?  ?  ?  ?  ? ?  ?AM-PAC OT "6 Clicks" Daily Activity     ?Outcome Measure ? ? Help from another person eating meals?: None ?Help from another person taking care of personal grooming?: A Little ?Help from another person toileting, which includes using toliet, bedpan, or urinal?: Total ?Help from another person bathing (including washing, rinsing, drying)?: A Lot ?Help from another  person to put on and taking off regular upper body clothing?: A Lot ?Help from another person to put on and taking off regular lower body clothing?: Total ?6 Click Score: 13 ? ?  ?End of Session Equipment Utilized During Treatment: Gait belt ? ?OT Visit Diagnosis: Unsteadiness on feet (R26.81);Other abnormalities of gait and mobility (R26.89);Muscle weakness (generalized) (M62.81);History of falling (Z91.81);Pain ?Pain - Right/Left: Left ?Pain - part of body: Leg;Hip ?  ?Activity Tolerance Patient limited by fatigue;Patient limited by pain ?  ?Patient Left in bed;with call bell/phone within reach;with bed alarm set ?  ?Nurse Communication Mobility status ?  ? ?   ? ?Time: 2778-2423 ?OT Time Calculation (min): 24 min ? ?Charges: OT General Charges ?$OT Visit: 1 Visit ?OT Treatments ?$Self Care/Home Management : 8-22 mins ?$Therapeutic Activity: 8-22 mins ? ?Julie Martinez, OTD, OTR/L ?Acute Rehab ?[phone] ? ? ?Julie Martinez ?03/26/2021, 10:51 AM ?

## 2021-03-26 NOTE — Assessment & Plan Note (Addendum)
Blood pressure high likely secondary to pain.  Continue Norvasc, IV hydralazine.  Home quinapril and hydrochlorothiazide on hold due to AKI. ?

## 2021-03-26 NOTE — Plan of Care (Addendum)
Changed sacral foam pad, redness still present, will continue to turn q2hrs, patient moves self back supine due to pain. Due to cognition impairment patient does not stay turned even when pillow in place. ? ? ?Problem: Education: ?Goal: Knowledge of General Education information will improve ?Description: Including pain rating scale, medication(s)/side effects and non-pharmacologic comfort measures ?Outcome: Progressing ?  ?Problem: Activity: ?Goal: Risk for activity intolerance will decrease ?Outcome: Progressing ?  ?Problem: Pain Managment: ?Goal: General experience of comfort will improve ?Outcome: Progressing ?  ?Problem: Safety: ?Goal: Ability to remain free from injury will improve ?Outcome: Progressing ?  ?Problem: Skin Integrity: ?Goal: Risk for impaired skin integrity will decrease ?Outcome: Progressing ?  ?

## 2021-03-27 DIAGNOSIS — M25552 Pain in left hip: Secondary | ICD-10-CM | POA: Diagnosis not present

## 2021-03-27 DIAGNOSIS — S3993XA Unspecified injury of pelvis, initial encounter: Secondary | ICD-10-CM | POA: Diagnosis not present

## 2021-03-27 DIAGNOSIS — Z741 Need for assistance with personal care: Secondary | ICD-10-CM | POA: Diagnosis not present

## 2021-03-27 DIAGNOSIS — E7800 Pure hypercholesterolemia, unspecified: Secondary | ICD-10-CM | POA: Diagnosis present

## 2021-03-27 DIAGNOSIS — S199XXA Unspecified injury of neck, initial encounter: Secondary | ICD-10-CM | POA: Diagnosis not present

## 2021-03-27 DIAGNOSIS — Z86711 Personal history of pulmonary embolism: Secondary | ICD-10-CM | POA: Diagnosis not present

## 2021-03-27 DIAGNOSIS — Z9181 History of falling: Secondary | ICD-10-CM | POA: Diagnosis not present

## 2021-03-27 DIAGNOSIS — M47812 Spondylosis without myelopathy or radiculopathy, cervical region: Secondary | ICD-10-CM | POA: Diagnosis not present

## 2021-03-27 DIAGNOSIS — R262 Difficulty in walking, not elsewhere classified: Secondary | ICD-10-CM | POA: Diagnosis not present

## 2021-03-27 DIAGNOSIS — Z20822 Contact with and (suspected) exposure to covid-19: Secondary | ICD-10-CM | POA: Diagnosis not present

## 2021-03-27 DIAGNOSIS — M6281 Muscle weakness (generalized): Secondary | ICD-10-CM | POA: Diagnosis not present

## 2021-03-27 DIAGNOSIS — Z66 Do not resuscitate: Secondary | ICD-10-CM | POA: Diagnosis not present

## 2021-03-27 DIAGNOSIS — S0003XA Contusion of scalp, initial encounter: Secondary | ICD-10-CM | POA: Diagnosis not present

## 2021-03-27 DIAGNOSIS — S3991XA Unspecified injury of abdomen, initial encounter: Secondary | ICD-10-CM | POA: Diagnosis not present

## 2021-03-27 DIAGNOSIS — J90 Pleural effusion, not elsewhere classified: Secondary | ICD-10-CM | POA: Diagnosis not present

## 2021-03-27 DIAGNOSIS — S066X0A Traumatic subarachnoid hemorrhage without loss of consciousness, initial encounter: Secondary | ICD-10-CM | POA: Diagnosis not present

## 2021-03-27 DIAGNOSIS — Z7901 Long term (current) use of anticoagulants: Secondary | ICD-10-CM | POA: Diagnosis not present

## 2021-03-27 DIAGNOSIS — N1832 Chronic kidney disease, stage 3b: Secondary | ICD-10-CM | POA: Diagnosis not present

## 2021-03-27 DIAGNOSIS — R404 Transient alteration of awareness: Secondary | ICD-10-CM | POA: Diagnosis not present

## 2021-03-27 DIAGNOSIS — S72002G Fracture of unspecified part of neck of left femur, subsequent encounter for closed fracture with delayed healing: Secondary | ICD-10-CM | POA: Diagnosis not present

## 2021-03-27 DIAGNOSIS — F0390 Unspecified dementia without behavioral disturbance: Secondary | ICD-10-CM | POA: Diagnosis present

## 2021-03-27 DIAGNOSIS — S065X0A Traumatic subdural hemorrhage without loss of consciousness, initial encounter: Secondary | ICD-10-CM | POA: Diagnosis not present

## 2021-03-27 DIAGNOSIS — S72009A Fracture of unspecified part of neck of unspecified femur, initial encounter for closed fracture: Secondary | ICD-10-CM | POA: Diagnosis not present

## 2021-03-27 DIAGNOSIS — R2681 Unsteadiness on feet: Secondary | ICD-10-CM | POA: Diagnosis not present

## 2021-03-27 DIAGNOSIS — I129 Hypertensive chronic kidney disease with stage 1 through stage 4 chronic kidney disease, or unspecified chronic kidney disease: Secondary | ICD-10-CM | POA: Diagnosis not present

## 2021-03-27 DIAGNOSIS — Z7401 Bed confinement status: Secondary | ICD-10-CM | POA: Diagnosis not present

## 2021-03-27 DIAGNOSIS — S065XAA Traumatic subdural hemorrhage with loss of consciousness status unknown, initial encounter: Secondary | ICD-10-CM | POA: Diagnosis not present

## 2021-03-27 DIAGNOSIS — K8020 Calculus of gallbladder without cholecystitis without obstruction: Secondary | ICD-10-CM | POA: Diagnosis not present

## 2021-03-27 DIAGNOSIS — Y92122 Bedroom in nursing home as the place of occurrence of the external cause: Secondary | ICD-10-CM | POA: Diagnosis not present

## 2021-03-27 DIAGNOSIS — I959 Hypotension, unspecified: Secondary | ICD-10-CM | POA: Diagnosis not present

## 2021-03-27 DIAGNOSIS — S51012A Laceration without foreign body of left elbow, initial encounter: Secondary | ICD-10-CM | POA: Diagnosis present

## 2021-03-27 DIAGNOSIS — I629 Nontraumatic intracranial hemorrhage, unspecified: Secondary | ICD-10-CM | POA: Diagnosis not present

## 2021-03-27 DIAGNOSIS — D62 Acute posthemorrhagic anemia: Secondary | ICD-10-CM | POA: Diagnosis not present

## 2021-03-27 DIAGNOSIS — S72002A Fracture of unspecified part of neck of left femur, initial encounter for closed fracture: Secondary | ICD-10-CM | POA: Diagnosis not present

## 2021-03-27 DIAGNOSIS — S0993XA Unspecified injury of face, initial encounter: Secondary | ICD-10-CM | POA: Diagnosis not present

## 2021-03-27 DIAGNOSIS — M4312 Spondylolisthesis, cervical region: Secondary | ICD-10-CM | POA: Diagnosis not present

## 2021-03-27 DIAGNOSIS — S2241XA Multiple fractures of ribs, right side, initial encounter for closed fracture: Secondary | ICD-10-CM | POA: Diagnosis not present

## 2021-03-27 DIAGNOSIS — R296 Repeated falls: Secondary | ICD-10-CM | POA: Diagnosis present

## 2021-03-27 DIAGNOSIS — Z90710 Acquired absence of both cervix and uterus: Secondary | ICD-10-CM | POA: Diagnosis not present

## 2021-03-27 DIAGNOSIS — J811 Chronic pulmonary edema: Secondary | ICD-10-CM | POA: Diagnosis not present

## 2021-03-27 DIAGNOSIS — R54 Age-related physical debility: Secondary | ICD-10-CM | POA: Diagnosis not present

## 2021-03-27 DIAGNOSIS — R1312 Dysphagia, oropharyngeal phase: Secondary | ICD-10-CM | POA: Diagnosis not present

## 2021-03-27 DIAGNOSIS — Z515 Encounter for palliative care: Secondary | ICD-10-CM | POA: Diagnosis not present

## 2021-03-27 DIAGNOSIS — R6510 Systemic inflammatory response syndrome (SIRS) of non-infectious origin without acute organ dysfunction: Secondary | ICD-10-CM | POA: Diagnosis not present

## 2021-03-27 DIAGNOSIS — N1831 Chronic kidney disease, stage 3a: Secondary | ICD-10-CM | POA: Diagnosis not present

## 2021-03-27 DIAGNOSIS — Z79899 Other long term (current) drug therapy: Secondary | ICD-10-CM | POA: Diagnosis not present

## 2021-03-27 DIAGNOSIS — W19XXXA Unspecified fall, initial encounter: Secondary | ICD-10-CM | POA: Diagnosis not present

## 2021-03-27 DIAGNOSIS — Z4789 Encounter for other orthopedic aftercare: Secondary | ICD-10-CM | POA: Diagnosis not present

## 2021-03-27 DIAGNOSIS — R000 Tachycardia, unspecified: Secondary | ICD-10-CM | POA: Diagnosis not present

## 2021-03-27 DIAGNOSIS — J9601 Acute respiratory failure with hypoxia: Secondary | ICD-10-CM | POA: Diagnosis not present

## 2021-03-27 DIAGNOSIS — Z8249 Family history of ischemic heart disease and other diseases of the circulatory system: Secondary | ICD-10-CM | POA: Diagnosis not present

## 2021-03-27 DIAGNOSIS — S0083XA Contusion of other part of head, initial encounter: Secondary | ICD-10-CM | POA: Diagnosis not present

## 2021-03-27 DIAGNOSIS — S72002S Fracture of unspecified part of neck of left femur, sequela: Secondary | ICD-10-CM | POA: Diagnosis not present

## 2021-03-27 DIAGNOSIS — I2510 Atherosclerotic heart disease of native coronary artery without angina pectoris: Secondary | ICD-10-CM | POA: Diagnosis not present

## 2021-03-27 DIAGNOSIS — S32511A Fracture of superior rim of right pubis, initial encounter for closed fracture: Secondary | ICD-10-CM | POA: Diagnosis not present

## 2021-03-27 DIAGNOSIS — K449 Diaphragmatic hernia without obstruction or gangrene: Secondary | ICD-10-CM | POA: Diagnosis not present

## 2021-03-27 DIAGNOSIS — I10 Essential (primary) hypertension: Secondary | ICD-10-CM | POA: Diagnosis not present

## 2021-03-27 DIAGNOSIS — S51812A Laceration without foreign body of left forearm, initial encounter: Secondary | ICD-10-CM | POA: Diagnosis present

## 2021-03-27 DIAGNOSIS — W06XXXA Fall from bed, initial encounter: Secondary | ICD-10-CM | POA: Diagnosis present

## 2021-03-27 DIAGNOSIS — R58 Hemorrhage, not elsewhere classified: Secondary | ICD-10-CM | POA: Diagnosis not present

## 2021-03-27 DIAGNOSIS — F03C0 Unspecified dementia, severe, without behavioral disturbance, psychotic disturbance, mood disturbance, and anxiety: Secondary | ICD-10-CM | POA: Diagnosis not present

## 2021-03-27 DIAGNOSIS — S066XAA Traumatic subarachnoid hemorrhage with loss of consciousness status unknown, initial encounter: Secondary | ICD-10-CM | POA: Diagnosis not present

## 2021-03-27 DIAGNOSIS — Z86718 Personal history of other venous thrombosis and embolism: Secondary | ICD-10-CM | POA: Diagnosis not present

## 2021-03-27 DIAGNOSIS — M6259 Muscle wasting and atrophy, not elsewhere classified, multiple sites: Secondary | ICD-10-CM | POA: Diagnosis not present

## 2021-03-27 LAB — CBC WITH DIFFERENTIAL/PLATELET
Abs Immature Granulocytes: 0.08 10*3/uL — ABNORMAL HIGH (ref 0.00–0.07)
Basophils Absolute: 0.1 10*3/uL (ref 0.0–0.1)
Basophils Relative: 1 %
Eosinophils Absolute: 0.5 10*3/uL (ref 0.0–0.5)
Eosinophils Relative: 5 %
HCT: 26.9 % — ABNORMAL LOW (ref 36.0–46.0)
Hemoglobin: 8.2 g/dL — ABNORMAL LOW (ref 12.0–15.0)
Immature Granulocytes: 1 %
Lymphocytes Relative: 14 %
Lymphs Abs: 1.4 10*3/uL (ref 0.7–4.0)
MCH: 31.5 pg (ref 26.0–34.0)
MCHC: 30.5 g/dL (ref 30.0–36.0)
MCV: 103.5 fL — ABNORMAL HIGH (ref 80.0–100.0)
Monocytes Absolute: 0.9 10*3/uL (ref 0.1–1.0)
Monocytes Relative: 10 %
Neutro Abs: 6.8 10*3/uL (ref 1.7–7.7)
Neutrophils Relative %: 69 %
Platelets: 269 10*3/uL (ref 150–400)
RBC: 2.6 MIL/uL — ABNORMAL LOW (ref 3.87–5.11)
RDW: 12.8 % (ref 11.5–15.5)
WBC: 9.8 10*3/uL (ref 4.0–10.5)
nRBC: 0.2 % (ref 0.0–0.2)

## 2021-03-27 LAB — BASIC METABOLIC PANEL
Anion gap: 9 (ref 5–15)
BUN: 38 mg/dL — ABNORMAL HIGH (ref 8–23)
CO2: 20 mmol/L — ABNORMAL LOW (ref 22–32)
Calcium: 8.4 mg/dL — ABNORMAL LOW (ref 8.9–10.3)
Chloride: 116 mmol/L — ABNORMAL HIGH (ref 98–111)
Creatinine, Ser: 1.37 mg/dL — ABNORMAL HIGH (ref 0.44–1.00)
GFR, Estimated: 37 mL/min — ABNORMAL LOW (ref >60)
Glucose, Bld: 104 mg/dL — ABNORMAL HIGH (ref 70–99)
Potassium: 4.3 mmol/L (ref 3.5–5.1)
Sodium: 145 mmol/L (ref 135–145)

## 2021-03-27 MED ORDER — POLYETHYLENE GLYCOL 3350 17 G PO PACK: 17.0000 g | PACK | Freq: Every day | ORAL | 0 refills | Status: DC | PRN

## 2021-03-27 MED ORDER — FERROUS SULFATE 325 (65 FE) MG PO TABS: 325.0000 mg | ORAL_TABLET | Freq: Every day | ORAL | 3 refills | Status: DC

## 2021-03-27 MED ORDER — DOCUSATE SODIUM 100 MG PO CAPS: 100.0000 mg | ORAL_CAPSULE | Freq: Two times a day (BID) | ORAL | 0 refills | Status: DC

## 2021-03-27 MED ORDER — AMLODIPINE BESYLATE 5 MG PO TABS: 5.0000 mg | ORAL_TABLET | Freq: Every day | ORAL | Status: DC

## 2021-03-27 MED ORDER — HYDROCODONE-ACETAMINOPHEN 5-325 MG PO TABS: 1.0000 | ORAL_TABLET | ORAL | 0 refills | Status: DC | PRN

## 2021-03-27 NOTE — Plan of Care (Signed)
?  Problem: Clinical Measurements: ?Goal: Will remain free from infection ?Outcome: Progressing ?Goal: Diagnostic test results will improve ?Outcome: Progressing ?Goal: Respiratory complications will improve ?Outcome: Progressing ?  ?Problem: Activity: ?Goal: Risk for activity intolerance will decrease ?Outcome: Progressing ?  ?Problem: Nutrition: ?Goal: Adequate nutrition will be maintained ?Outcome: Progressing ?  ?Problem: Coping: ?Goal: Level of anxiety will decrease ?Outcome: Progressing ?  ?Problem: Elimination: ?Goal: Will not experience complications related to bowel motility ?Outcome: Progressing ?  ?Problem: Pain Managment: ?Goal: General experience of comfort will improve ?Outcome: Progressing ?  ?Problem: Safety: ?Goal: Ability to remain free from injury will improve ?Outcome: Progressing ?  ?Problem: Skin Integrity: ?Goal: Risk for impaired skin integrity will decrease ?Outcome: Progressing ?  ?

## 2021-03-27 NOTE — Care Management Important Message (Signed)
Important Message ? ?Patient Details  ?Name: Julie Martinez ?MRN: [id number] ?Date of Birth: May 18, 1931 ? ? ?Medicare Important Message Given:  Yes ? ? ? ? ?Levada Dy  Less [place] ?03/27/2021, 3:00 PM ?

## 2021-03-27 NOTE — Progress Notes (Signed)
Physical Therapy Treatment ?Patient Details ?Name: Julie Martinez ?MRN: [id number] ?DOB: 12-Feb-1931 ?Today's Date: 03/27/2021 ? ? ?History of Present Illness 88 yo female presents to [facility] on 3/3 with fall OOB, found by grandson, sustained L femoral neck fx. s/p L hip hemiarthroplasty on 3/5. PMH includes dementia, DVT/PE, HTN, osteopenia. ? ?  ?PT Comments  ? ? Pt pleasant and agreeable to OOB to chair this session. Frequent reminders of why her LLE was painful, however overall pt with good rehab effort. Stedy utilized for [employer] transfer. Recommend pt don brief prior to mobility for next session as she has significant stress urinary incontinence which limited session. Will continue to follow and progress as able per POC.  ?   ?Recommendations for follow up therapy are one component of a multi-disciplinary discharge planning process, led by the attending physician.  Recommendations may be updated based on patient status, additional functional criteria and insurance authorization. ? ?Follow Up Recommendations ? Skilled nursing-short term rehab (<3 hours/day) ?  ?  ?Assistance Recommended at Discharge Frequent or constant Supervision/Assistance  ?Patient can return home with the following A lot of help with bathing/dressing/bathroom;Two people to help with walking and/or transfers;Direct supervision/assist for medications management;Direct supervision/assist for financial management;Assist for transportation;Help with stairs or ramp for entrance ?  ?Equipment Recommendations ? None recommended by PT  ?  ?Recommendations for Other Services   ? ? ?  ?Precautions / Restrictions Precautions ?Precautions: Fall ?Precaution Comments: Stress incontinence - would benefit from donning a brief prior to mobility. ?Restrictions ?Weight Bearing Restrictions: Yes ?LLE Weight Bearing: Weight bearing as tolerated  ?  ? ?Mobility ? Bed Mobility ?Overal bed mobility: Needs Assistance ?Bed Mobility: Supine to Sit ?  ?  ?Supine to sit:  Total assist ?  ?  ?General bed mobility comments: Rolled to the R and pt was transitioned into sitting EOB with total assist from therapist and use of bed pad for scooting out fully to EOB. ?  ? ?Transfers ?Overall transfer level: Needs assistance ?Equipment used: 2 person hand held assist ?Transfers: Bed to chair/wheelchair/BSC, Sit to/from Stand ?Sit to Stand: Max assist, +2 physical assistance ?  ?  ?  ?  ?  ?General transfer comment: Charlaine Dalton utilized for transition to chair. Therapist holding towel between legs due to stress incontinence. Pt able to maintain standing with BUE's on the center bar of the [facility] for peri care. ?Transfer via Lift Equipment: Stedy ? ?Ambulation/Gait ?  ?  ?  ?  ?  ?  ?  ?General Gait Details: Not able to attempt this session. ? ? ?Stairs ?  ?  ?  ?  ?  ? ? ?Wheelchair Mobility ?  ? ?Modified Rankin (Stroke Patients Only) ?  ? ? ?  ?Balance Overall balance assessment: Needs assistance ?Sitting-balance support: Feet supported, Bilateral upper extremity supported ?Sitting balance-Leahy Scale: Fair ?Sitting balance - Comments: sits unsupported EOB without LOB ?Postural control: Right lateral lean ?Standing balance support: Bilateral upper extremity supported ?Standing balance-Leahy Scale: Zero ?Standing balance comment: max A +2 ?  ?  ?  ?  ?  ?  ?  ?  ?  ?  ?  ?  ? ?  ?Cognition Arousal/Alertness: Awake/alert ?Behavior During Therapy: Restless ?Overall Cognitive Status: History of cognitive impairments - at baseline ?  ?  ?  ?  ?  ?  ?  ?  ?  ?  ?  ?  ?  ?  ?  ?  ?General Comments:  history of advanced dementia, requires frequent reminders that pain is from hip fx surgery ?  ?  ? ?  ?Exercises   ? ?  ?General Comments   ?  ?  ? ?Pertinent Vitals/Pain Pain Assessment ?Pain Assessment: Faces ?Faces Pain Scale: Hurts even more ?Pain Location: L hip, during mobility ?Pain Descriptors / Indicators: Grimacing, Sore, Crying, Operative site guarding ?Pain Intervention(s): Limited activity within  patient's tolerance, Monitored during session, Repositioned  ? ? ?Home Living   ?  ?  ?  ?  ?  ?  ?  ?  ?  ?   ?  ?Prior Function    ?  ?  ?   ? ?PT Goals (current goals can now be found in the care plan section) Acute Rehab PT Goals ?Patient Stated Goal: None stated ?PT Goal Formulation: With patient ?Time For Goal Achievement: 04/07/21 ?Potential to Achieve Goals: Good ?Progress towards PT goals: Progressing toward goals ? ?  ?Frequency ? ? ? Min 3X/week ? ? ? ?  ?PT Plan Current plan remains appropriate  ? ? ?Co-evaluation   ?  ?  ?  ?  ? ?  ?AM-PAC PT "6 Clicks" Mobility   ?Outcome Measure ? Help needed turning from your back to your side while in a flat bed without using bedrails?: Total ?Help needed moving from lying on your back to sitting on the side of a flat bed without using bedrails?: Total ?Help needed moving to and from a bed to a chair (including a wheelchair)?: Total ?Help needed standing up from a chair using your arms (e.g., wheelchair or bedside chair)?: Total ?Help needed to walk in hospital room?: Total ?Help needed climbing 3-5 steps with a railing? : Total ?6 Click Score: 6 ? ?  ?End of Session Equipment Utilized During Treatment: Gait belt ?Activity Tolerance: Patient limited by pain ?Patient left: in bed;with call bell/phone within reach;with bed alarm set ?Nurse Communication: Mobility status ?PT Visit Diagnosis: Other abnormalities of gait and mobility (R26.89);Pain ?Pain - Right/Left: Left ?Pain - part of body: Hip ?  ? ? ?Time: 0912-0950 ?PT Time Calculation (min) (ACUTE ONLY): 38 min ? ?Charges:  $Gait Training: 23-37 mins ?$Therapeutic Activity: 8-22 mins          ?          ? ?Rolinda Roan, PT, DPT ?Acute Rehabilitation Services ?Pager: [phone] ?Office: [phone]  ? ? ?Thelma Comp ?03/27/2021, 10:50 AM ? ?

## 2021-03-27 NOTE — Discharge Summary (Signed)
Physician Discharge Summary  [name] OJJ:[id number] DOB: [birth year] DOA: 03/21/2021  PCP: Cari Caraway, MD  Admit date: 03/21/2021 Discharge date: 03/27/2021  Admitted From: Home Disposition:  Home  Discharge Condition:Stable CODE STATUS:FULL Diet recommendation: Dysphagia 3 diet   Brief/Interim Summary:  Julie Martinez is a 88 y.o. female with medical history significant for advanced dementia, DVT/PE on Eliquis, HTN, who presented from home after a fall.  Patient lives at home.  Per her daughter pt got out of bed took a few steps then fell. No LOC. She was in her usual state of health prior to this.Left hip x-ray shows subcapital hip fracture.  Status post left hip hemiarthroplasty.  PT/OT recommending skilled nursing facility on discharge.  Medically stable for discharge as soon as bed is available.    Following problems were addressed during her hospitalization:  Closed left hip fracture (HCC) Left pelvic x-ray showing pathologic subcapital left femoral neck fracture with mild varus angulation. Orthopedic surgery consulted, s/p left hip hemiarthroplasty on 03/23/2021 DVT PPx with eliquis, pain management  PT/OT- SNF   Acute postoperative anemia due to expected blood loss Hemoglobin in the range  of 7.  Most likely associated with surgery.  Iron panel shows severe iron deficiency.  Supplemented with IV iron.  She needs to continue oral iron on discharge.  Check CBC in a week   CKD stage G3a/A3, GFR 45-59 and albumin creatinine ratio >300 mg/g (HCC) Baseline creatinine 1.4.  Presented with AKI due to poor oral intake.  Kidney function improved and is currently at baseline.  Check BMP in a week   Personal history of DVT (deep vein thrombosis) Also history of PE.  On Eliquis.   Essential hypertension  Continue Norvasc.  Home quinapril and hydrochlorothiazide on hold due to AKI.   Dementia ([place]) Reorient as needed.  Delirium precautions.      Discharge Diagnoses:   Principal Problem:   Closed left hip fracture ([place]) Active Problems:   Acute postoperative anemia due to expected blood loss   CKD stage G3a/A3, GFR 45-59 and albumin creatinine ratio >300 mg/g (HCC)   Dementia (HCC)   Personal history of DVT (deep vein thrombosis)    Discharge Instructions  Discharge Instructions     Diet general   Complete by: As directed    Dysphagia 3 diet   Discharge instructions   Complete by: As directed    1)Please take prescribed medications as instructed 2)Follow up with orthopedics in 2 weeks.  Name and number the provider has been attached 3)Do a CBC and BMP tests in a week 4)Monitor your blood pressure   Increase activity slowly   Complete by: As directed    No wound care   Complete by: As directed       Allergies as of 03/27/2021   No Known Allergies      Medication List     STOP taking these medications    quinapril 20 MG tablet Commonly known as: ACCUPRIL   triamterene-hydrochlorothiazide 37.5-25 MG tablet Commonly known as: MAXZIDE-25       TAKE these medications    amLODipine 5 MG tablet Commonly known as: NORVASC Take 1 tablet (5 mg total) by mouth daily.   apixaban 2.5 MG Tabs tablet Commonly known as: ELIQUIS Take 2.5 mg by mouth 2 (two) times daily.   cholecalciferol 25 MCG (1000 UNIT) tablet Commonly known as: VITAMIN D Take 1,000 Units by mouth daily.   docusate sodium 100 MG capsule Commonly known  as: COLACE Take 1 capsule (100 mg total) by mouth 2 (two) times daily.   donepezil 10 MG tablet Commonly known as: ARICEPT Take 1 tablet (10 mg total) by mouth at bedtime.   ferrous sulfate 325 (65 FE) MG tablet Take 1 tablet (325 mg total) by mouth daily with breakfast.   HYDROcodone-acetaminophen 5-325 MG tablet Commonly known as: NORCO/VICODIN Take 1 tablet by mouth every 4 (four) hours as needed for moderate pain (pain score 4-6).   memantine 10 MG tablet Commonly known as: NAMENDA Take 1 tablet (10  mg total) by mouth 2 (two) times daily.   mirtazapine 7.5 MG tablet Commonly known as: REMERON Take 1 tablet (7.5 mg total) by mouth at bedtime.   polyethylene glycol 17 g packet Commonly known as: MIRALAX / GLYCOLAX Take 17 g by mouth daily as needed for mild constipation.   THERAWORX RELIEF EX Apply 1 application topically daily as needed (leg pain).   TYLENOL ARTHRITIS PAIN PO Take 650 tablets by mouth every 8 (eight) hours as needed (pain).   vitamin B-12 1000 MCG tablet Commonly known as: CYANOCOBALAMIN Take 1,000 mcg by mouth daily.        Follow-up Information     Marlou Sa, Tonna Corner, MD. Schedule an appointment as soon as possible for a visit in 2 week(s).   Specialty: Orthopedic Surgery Contact information: [address] [place] [phone]                No Known Allergies  Consultations: Orthopedics   Procedures/Studies: DG Pelvis 1-2 Views  Result Date: 03/21/2021 CLINICAL DATA:  Fall, left hip pain EXAM: PELVIS - 1-2 VIEW COMPARISON:  None. FINDINGS: There is a pathologic fracture involving the subcapital left femoral neck with a lytic lesions seen within the superior aspect of the femoral neck and erosive changes noted along the fracture margin of the proximal femur. Marked varus angulation. Mild bilateral degenerative hip arthritis. No additional fracture or dislocation identified. No additional lytic or blastic bone lesion. IMPRESSION: Pathologic subcapital left femoral neck fracture with marked varus angulation. Electronically Signed   By: Fidela Salisbury M.D.   On: 03/21/2021 04:25   DG Knee 2 Views Left  Result Date: 03/21/2021 CLINICAL DATA:  Left leg pain EXAM: LEFT KNEE - 1-2 VIEW COMPARISON:  None. FINDINGS: Single view radiograph left knee demonstrates normal alignment on this limited examination. No definite fracture or dislocation. Joint spaces are not well profiled. IMPRESSION: Limited but unremarkable examination.  Electronically Signed   By: Fidela Salisbury M.D.   On: 03/21/2021 04:26   CT Head Wo Contrast  Result Date: 03/21/2021 CLINICAL DATA:  Fall, head and neck trauma EXAM: CT HEAD WITHOUT CONTRAST CT CERVICAL SPINE WITHOUT CONTRAST TECHNIQUE: Multidetector CT imaging of the head and cervical spine was performed following the standard protocol without intravenous contrast. Multiplanar CT image reconstructions of the cervical spine were also generated. RADIATION DOSE REDUCTION: This exam was performed according to the departmental dose-optimization program which includes automated exposure control, adjustment of the mA and/or kV according to patient size and/or use of iterative reconstruction technique. COMPARISON:  None. FINDINGS: CT HEAD FINDINGS Brain: Normal anatomic configuration. Parenchymal volume loss is commensurate with the patient's age. Mild periventricular white matter changes are present likely reflecting the sequela of small vessel ischemia. No abnormal intra or extra-axial mass lesion or fluid collection. No abnormal mass effect or midline shift. No evidence of acute intracranial hemorrhage or infarct. Ventricular size is normal. Cerebellum unremarkable. Vascular:  No asymmetric hyperdense vasculature at the skull base. Skull: Intact Sinuses/Orbits: Paranasal sinuses are clear. Ocular lenses have been removed. Orbits are otherwise unremarkable. Other: Mastoid air cells and middle ear cavities are clear. CT CERVICAL SPINE FINDINGS Alignment: There is extension weighted cervical lordosis, likely positional in nature. 2 mm anterolisthesis C4-5. Skull base and vertebrae: Craniocervical alignment is normal. The atlantodental interval is not widened. There is a mild remote appearing compression deformity of C3 with minimal loss of height and minimal buckling of the posterior cortex without frank retropulsion. A remote appearing inferior endplate fracture of C6 is noted. No acute fracture of the cervical spine.  Soft tissues and spinal canal: No prevertebral fluid or swelling. No visible canal hematoma. Disc levels: There is intervertebral disc space narrowing and endplate remodeling of Q4-O9, most severe at C5-6 and C6-7 in keeping with changes of moderate to severe degenerative disc disease. Prevertebral soft tissues are not thickened on sagittal reformats. Spinal canal is widely patent. Review of the axial images demonstrates multilevel uncovertebral and facet arthrosis resulting in mild neuroforaminal narrowing on the right at C4-5. Upper chest: Biapical scarring noted. Other: None IMPRESSION: No acute intracranial injury.  No calvarial fracture. No acute fracture or listhesis of the cervical spine. Electronically Signed   By: Fidela Salisbury M.D.   On: 03/21/2021 04:23   CT Cervical Spine Wo Contrast  Result Date: 03/21/2021 CLINICAL DATA:  Fall, head and neck trauma EXAM: CT HEAD WITHOUT CONTRAST CT CERVICAL SPINE WITHOUT CONTRAST TECHNIQUE: Multidetector CT imaging of the head and cervical spine was performed following the standard protocol without intravenous contrast. Multiplanar CT image reconstructions of the cervical spine were also generated. RADIATION DOSE REDUCTION: This exam was performed according to the departmental dose-optimization program which includes automated exposure control, adjustment of the mA and/or kV according to patient size and/or use of iterative reconstruction technique. COMPARISON:  None. FINDINGS: CT HEAD FINDINGS Brain: Normal anatomic configuration. Parenchymal volume loss is commensurate with the patient's age. Mild periventricular white matter changes are present likely reflecting the sequela of small vessel ischemia. No abnormal intra or extra-axial mass lesion or fluid collection. No abnormal mass effect or midline shift. No evidence of acute intracranial hemorrhage or infarct. Ventricular size is normal. Cerebellum unremarkable. Vascular: No asymmetric hyperdense vasculature at  the skull base. Skull: Intact Sinuses/Orbits: Paranasal sinuses are clear. Ocular lenses have been removed. Orbits are otherwise unremarkable. Other: Mastoid air cells and middle ear cavities are clear. CT CERVICAL SPINE FINDINGS Alignment: There is extension weighted cervical lordosis, likely positional in nature. 2 mm anterolisthesis C4-5. Skull base and vertebrae: Craniocervical alignment is normal. The atlantodental interval is not widened. There is a mild remote appearing compression deformity of C3 with minimal loss of height and minimal buckling of the posterior cortex without frank retropulsion. A remote appearing inferior endplate fracture of C6 is noted. No acute fracture of the cervical spine. Soft tissues and spinal canal: No prevertebral fluid or swelling. No visible canal hematoma. Disc levels: There is intervertebral disc space narrowing and endplate remodeling of G2-X5, most severe at C5-6 and C6-7 in keeping with changes of moderate to severe degenerative disc disease. Prevertebral soft tissues are not thickened on sagittal reformats. Spinal canal is widely patent. Review of the axial images demonstrates multilevel uncovertebral and facet arthrosis resulting in mild neuroforaminal narrowing on the right at C4-5. Upper chest: Biapical scarring noted. Other: None IMPRESSION: No acute intracranial injury.  No calvarial fracture. No acute fracture or listhesis of the  cervical spine. Electronically Signed   By: Fidela Salisbury M.D.   On: 03/21/2021 04:23   CT PELVIS WO CONTRAST  Result Date: 03/21/2021 CLINICAL DATA:  88 year old female status post fall with hip pain. Proximal left femur fracture. EXAM: CT PELVIS WITHOUT CONTRAST TECHNIQUE: Multidetector CT imaging of the pelvis was performed following the standard protocol without intravenous contrast. RADIATION DOSE REDUCTION: This exam was performed according to the departmental dose-optimization program which includes automated exposure control,  adjustment of the mA and/or kV according to patient size and/or use of iterative reconstruction technique. COMPARISON:  Lumbar MRI 06/17/2016. FINDINGS: Urinary Tract: Kidneys not included. No hydroureter. Unremarkable bladder. Incidental pelvic phleboliths. Bowel: No dilated large or small bowel. Diverticulosis throughout the sigmoid and visible descending colon. No bowel inflammation identified. No pneumoperitoneum identified. However, there is a small right inguinal hernia containing a small bowel loop and adjacent mesentery (series 4, image 325). The hernia is approximately 4 cm in length, and does not appear incarcerated. Vascular/Lymphatic: Vascular patency is not evaluated in the absence of IV contrast. Calcified atherosclerosis at the aortoiliac bifurcation. No inguinal or pelvic lymphadenopathy identified. Reproductive: Surgically absent uterus. Normal ovaries along the pelvic sidewalls. Other:  No pelvic free fluid. Musculoskeletal: Moderate to severe L4 compression fracture corresponding to that seen in 2018 with superior endplate deformity and retropulsion of the posterosuperior endplate contributing to severe L3-L4 spinal stenosis which is partially visible. L5 vertebra, sacrum and SI joints appear intact with relatively typical osteopenia for age. There are chronic bilateral pubic rami fractures. Iliac wing and pelvic bone mineralization also appears age-appropriate. Proximal right femur is intact with no discrete bone lesion. Subcapital left femoral neck fracture with varus impaction and posterior rotation of the femoral head is noted. There is a conspicuous roughly 18 mm radiolucent lesion along the superolateral femoral head fragment as seen radiographically (series 6, image 79). But the other proximal left femur bone mineralization is normal for age. No significant hematoma about the hip fracture. Superficial body wall and pelvic, proximal lower extremity muscular Cher in general appears normal.  IMPRESSION: 1. Acute, impacted subcapital left femoral neck fracture with an 18 mm lucent bone lesion along the fracture margin as seen radiographically. This does suggest a pathologic fracture, but no other primary bone lesions are identified about the pelvis. Age related osteopenia is noted. 2. No other acute osseous abnormality identified. Chronic bilateral pubic rami fractures. Chronic L4 compression fracture with retropulsion and severe L3-L4 spinal stenosis stable since 2018. 3. Small right inguinal hernia containing a small bowel loop and mesentery does not appear incarcerated. Diverticulosis of the distal colon. Aortic Atherosclerosis (ICD10-I70.0). Electronically Signed   By: Genevie Ann M.D.   On: 03/21/2021 04:55   Pelvis Portable  Result Date: 03/23/2021 CLINICAL DATA:  Status post hip arthroplasty EXAM: PORTABLE PELVIS 1-2 VIEWS COMPARISON:  March 21, 2021 FINDINGS: Status post LEFT hip arthroplasty. Orthopedic hardware is intact and without periprosthetic fracture or lucency. Soft tissue air consistent with recent surgical intervention. Pelvic phleboliths. Osteopenia. Degenerative changes of the lower lumbar spine. IMPRESSION: Expected postsurgical changes status post LEFT hip arthroplasty. Electronically Signed   By: Valentino Saxon M.D.   On: 03/23/2021 12:11   DG C-Arm 1-60 Min-No Report  Result Date: 03/23/2021 Fluoroscopy was utilized by the requesting physician.  No radiographic interpretation.   DG C-Arm 1-60 Min-No Report  Result Date: 03/23/2021 Fluoroscopy was utilized by the requesting physician.  No radiographic interpretation.   DG HIP UNILAT WITH PELVIS 1V LEFT  Result Date: 03/23/2021 CLINICAL DATA:  Left hip arthroplasty EXAM: DG HIP (WITH OR WITHOUT PELVIS) 1V*L* COMPARISON:  None. FINDINGS: Multiple intraoperative fluoroscopic spot images are provided. Interval left total hip arthroplasty. Normal alignment. FLUOROSCOPY TIME:  Fluoroscopy Time:  12.4 seconds Radiation  Exposure Index (if provided by the fluoroscopic device): 1.2 mGy Number of Acquired Spot Images: 0 IMPRESSION: 1. Interval left total hip arthroplasty. Electronically Signed   By: Kathreen Devoid M.D.   On: 03/23/2021 10:25   DG Femur Min 2 Views Left  Result Date: 03/21/2021 CLINICAL DATA:  Left hip fracture EXAM: LEFT FEMUR 2 VIEWS COMPARISON:  None. FINDINGS: There is a pathologic subcapital femoral neck fracture of the left hip with a lytic lesion involving the superior aspect of the subcapital femoral neck. Override and marked varus angulation of the femoral shaft. Femoral head is still seated within the left acetabulum with superimposed mild-to-moderate degenerative hip arthritis. No additional fracture or dislocation. No additional lytic or blastic bone lesion. IMPRESSION: Pathologic left subcapital femoral neck fracture with override and marked varus angulation. Contrast enhanced MRI examination and bone scintigraphy may be helpful for further evaluation. Electronically Signed   By: Fidela Salisbury M.D.   On: 03/21/2021 04:29   VAS [place] LOWER EXTREMITY VENOUS (DVT)  Result Date: 03/23/2021  Lower Venous DVT Study Patient Name:  ROBBYE DEDE  Date of Exam:   03/22/2021 Medical Rec #: [id number]         Accession #:    [id number] Date of Birth: [birth year]         Patient Gender: F Patient Age:   73 years Exam Location:  [facility] Procedure:      VAS [place] LOWER EXTREMITY VENOUS (DVT) Referring Phys: Irene Pap --------------------------------------------------------------------------------  Indications: Pulmonary embolism.  Risk Factors: Trauma. Limitations: Poor ultrasound/tissue interface and patient positioning, patient immobility, patient pain tolerance, poor patient cooperation. Comparison Study: No prior studies. Performing Technologist: Oliver Hum RVT  Examination Guidelines: A complete evaluation includes B-mode imaging, spectral Doppler, color Doppler, and power Doppler as needed of all  accessible portions of each vessel. Bilateral testing is considered an integral part of a complete examination. Limited examinations for reoccurring indications may be performed as noted. The reflux portion of the exam is performed with the patient in reverse Trendelenburg.  +---------+---------------+---------+-----------+----------+-------------------+  RIGHT     Compressibility Phasicity Spontaneity Properties Thrombus Aging       +---------+---------------+---------+-----------+----------+-------------------+  CFV       Full            Yes       Yes                                         +---------+---------------+---------+-----------+----------+-------------------+  SFJ       Full                                                                  +---------+---------------+---------+-----------+----------+-------------------+  FV Prox   Full                                                                  +---------+---------------+---------+-----------+----------+-------------------+  FV Mid                    Yes       Yes                                         +---------+---------------+---------+-----------+----------+-------------------+  FV Distal                 Yes       Yes                                         +---------+---------------+---------+-----------+----------+-------------------+  PFV       Full                                                                  +---------+---------------+---------+-----------+----------+-------------------+  POP       Full            Yes       Yes                                         +---------+---------------+---------+-----------+----------+-------------------+  PTV       Full                                                                  +---------+---------------+---------+-----------+----------+-------------------+  PERO                                                       Not well visualized   +---------+---------------+---------+-----------+----------+-------------------+   +---------+---------------+---------+-----------+----------+-------------------+  LEFT      Compressibility Phasicity Spontaneity Properties Thrombus Aging       +---------+---------------+---------+-----------+----------+-------------------+  CFV       Full            Yes       Yes                                         +---------+---------------+---------+-----------+----------+-------------------+  SFJ       Full                                                                  +---------+---------------+---------+-----------+----------+-------------------+  FV Prox   Full                                                                  +---------+---------------+---------+-----------+----------+-------------------+  FV Mid                    Yes       Yes                                         +---------+---------------+---------+-----------+----------+-------------------+  FV Distal                 Yes       Yes                                         +---------+---------------+---------+-----------+----------+-------------------+  PFV       Full                                                                  +---------+---------------+---------+-----------+----------+-------------------+  POP                                                        Not well visualized  +---------+---------------+---------+-----------+----------+-------------------+  PTV       Full                                                                  +---------+---------------+---------+-----------+----------+-------------------+  PERO                                                       Not well visualized  +---------+---------------+---------+-----------+----------+-------------------+     Summary: RIGHT: - There is no evidence of deep vein thrombosis in the lower extremity. However, portions of this examination were limited- see technologist  comments above.  - No cystic structure found in the popliteal fossa.  LEFT: - There is no evidence of deep vein thrombosis in the lower extremity. However, portions of this examination were limited- see technologist comments above.  - No cystic structure found in the popliteal fossa.  *See table(s) above for measurements and observations. Electronically signed by Orlie Pollen on 03/23/2021 at 10:15:16 AM.    Final       Subjective: Patient seen and examined at the bedside this morning.  Hemodynamically stable.  She was working with physical therapy.  Looks overall comfortable.  Medically stable for discharge.Called the daughter and updated about dc planning  Discharge Exam: Vitals:   03/27/21 0414 03/27/21 0758  BP: (!) 137/52 (!) 142/54  Pulse: 92 77  Resp: 16 18  Temp: 98 F (36.7 C) 98.2 F (36.8 C)  SpO2: 96% 97%   Vitals:   03/26/21 1557 03/26/21 2034 03/27/21  0414 03/27/21 0758  BP: (!) 129/44 (!) 118/50 (!) 137/52 (!) 142/54  Pulse: 93 (!) 101 92 77  Resp: '16 20 16 18  '$ Temp: 98.1 F (36.7 C) 98.6 F (37 C) 98 F (36.7 C) 98.2 F (36.8 C)  TempSrc: Oral   Oral  SpO2: 97% 95% 96% 97%  Weight:      Height:        General: Pt is alert, awake, not in acute distress Cardiovascular: RRR, S1/S2 +, no rubs, no gallops Respiratory: CTA bilaterally, no wheezing, no rhonchi Abdominal: Soft, NT, ND, bowel sounds + Extremities: no edema, no cyanosis    The results of significant diagnostics from this hospitalization (including imaging, microbiology, ancillary and laboratory) are listed below for reference.     Microbiology: Recent Results (from the past 240 hour(s))  Resp Panel by RT-PCR (Flu A&B, Covid) Nasopharyngeal Swab     Status: None   Collection Time: 03/21/21  4:26 AM   Specimen: Nasopharyngeal Swab; Nasopharyngeal(NP) swabs in vial transport medium  Result Value Ref Range Status   SARS Coronavirus 2 by RT PCR NEGATIVE NEGATIVE Final    Comment: (NOTE) SARS-CoV-2  target nucleic acids are NOT DETECTED.  The SARS-CoV-2 RNA is generally detectable in upper respiratory specimens during the acute phase of infection. The lowest concentration of SARS-CoV-2 viral copies this assay can detect is 138 copies/mL. A negative result does not preclude SARS-Cov-2 infection and should not be used as the sole basis for treatment or other patient management decisions. A negative result may occur with  improper specimen collection/handling, submission of specimen other than nasopharyngeal swab, presence of viral mutation(s) within the areas targeted by this assay, and inadequate number of viral copies(<138 copies/mL). A negative result must be combined with clinical observations, patient history, and epidemiological information. The expected result is Negative.  Fact Sheet for Patients:  EntrepreneurPulse.com.au  Fact Sheet for Healthcare Providers:  IncredibleEmployment.be  This test is no t yet approved or cleared by the [place] FDA and  has been authorized for detection and/or diagnosis of SARS-CoV-2 by FDA under an Emergency Use Authorization (EUA). This EUA will remain  in effect (meaning this test can be used) for the duration of the COVID-19 declaration under Section 564(b)(1) of the Act, 21 U.S.C.section 360bbb-3(b)(1), unless the authorization is terminated  or revoked sooner.       Influenza A by PCR NEGATIVE NEGATIVE Final   Influenza B by PCR NEGATIVE NEGATIVE Final    Comment: (NOTE) The Xpert Xpress SARS-CoV-2/FLU/RSV plus assay is intended as an aid in the diagnosis of influenza from Nasopharyngeal swab specimens and should not be used as a sole basis for treatment. Nasal washings and aspirates are unacceptable for Xpert Xpress SARS-CoV-2/FLU/RSV testing.  Fact Sheet for Patients: EntrepreneurPulse.com.au  Fact Sheet for Healthcare  Providers: IncredibleEmployment.be  This test is not yet approved or cleared by the [place] FDA and has been authorized for detection and/or diagnosis of SARS-CoV-2 by FDA under an Emergency Use Authorization (EUA). This EUA will remain in effect (meaning this test can be used) for the duration of the COVID-19 declaration under Section 564(b)(1) of the Act, 21 U.S.C. section 360bbb-3(b)(1), unless the authorization is terminated or revoked.  Performed at [facility], [address] [place], [place] [address]   Surgical pcr screen     Status: Abnormal   Collection Time: 03/23/21  6:58 AM   Specimen: Nasal Mucosa; Nasal Swab  Result Value Ref Range Status  MRSA, PCR NEGATIVE NEGATIVE Final   Staphylococcus aureus POSITIVE (A) NEGATIVE Final    Comment: (NOTE) The Xpert SA Assay (FDA approved for NASAL specimens in patients 2 years of age and older), is one component of a comprehensive surveillance program. It is not intended to diagnose infection nor to guide or monitor treatment. Performed at [facility], [place] [address], [place], [place] [address]      Labs: BNP (last 3 results) No results for input(s): BNP in the last 8760 hours. Basic Metabolic Panel: Recent Labs  Lab 03/22/21 0210 03/23/21 0141 03/24/21 0220 03/25/21 0249 03/26/21 0401 03/27/21 0153  NA 140 140 139 140 144 145  K 4.5 4.5 4.7 4.0 4.4 4.3  CL 110 108 107 112* 115* 116*  CO2 22 24 21* 21* 21* 20*  GLUCOSE 128* 100* 116* 130* 106* 104*  BUN 32* 41* 46* 47* 43* 38*  CREATININE 1.36* 1.66* 1.82* 1.47* 1.57* 1.37*  CALCIUM 8.8* 8.6* 8.1* 8.0* 8.0* 8.4*  MG 2.0  --   --   --   --   --   PHOS 3.4  --   --   --   --   --    Liver Function Tests: Recent Labs  Lab 03/21/21 0322 03/22/21 0210  AST 18  --   ALT 14  --   ALKPHOS 71  --   BILITOT 0.4  --   PROT 6.4*  --   ALBUMIN 3.4* 2.9*   No results for input(s): LIPASE, AMYLASE in the last  168 hours. No results for input(s): AMMONIA in the last 168 hours. CBC: Recent Labs  Lab 03/23/21 0141 03/24/21 0220 03/25/21 0249 03/26/21 0401 03/27/21 0153  WBC 9.3 12.9* 11.4* 9.5 9.8  NEUTROABS 6.4 10.5* 8.6* 7.0 6.8  HGB 10.0* 9.1* 8.0* 7.8* 8.2*  HCT 30.9* 28.1* 25.4* 24.6* 26.9*  MCV 102.3* 101.1* 101.6* 104.2* 103.5*  PLT 222 207 201 221 269   Cardiac Enzymes: No results for input(s): CKTOTAL, CKMB, CKMBINDEX, TROPONINI in the last 168 hours. BNP: Invalid input(s): POCBNP CBG: No results for input(s): GLUCAP in the last 168 hours. D-Dimer No results for input(s): DDIMER in the last 72 hours. Hgb A1c No results for input(s): HGBA1C in the last 72 hours. Lipid Profile No results for input(s): CHOL, HDL, LDLCALC, TRIG, CHOLHDL, LDLDIRECT in the last 72 hours. Thyroid function studies No results for input(s): TSH, T4TOTAL, T3FREE, THYROIDAB in the last 72 hours.  Invalid input(s): FREET3 Anemia work up Recent Labs    03/26/21 0401  VITAMINB12 373  FOLATE 18.3  FERRITIN 80  TIBC 224*  IRON 12*   Urinalysis    Component Value Date/Time   COLORURINE YELLOW 03/22/2021 0130   APPEARANCEUR CLEAR 03/22/2021 0130   LABSPEC 1.017 03/22/2021 0130   PHURINE 5.0 03/22/2021 0130   GLUCOSEU NEGATIVE 03/22/2021 0130   HGBUR NEGATIVE 03/22/2021 0130   BILIRUBINUR NEGATIVE 03/22/2021 0130   KETONESUR NEGATIVE 03/22/2021 0130   PROTEINUR NEGATIVE 03/22/2021 0130   UROBILINOGEN 1.0 11/12/2014 1525   NITRITE NEGATIVE 03/22/2021 0130   LEUKOCYTESUR TRACE (A) 03/22/2021 0130   Sepsis Labs Invalid input(s): PROCALCITONIN,  WBC,  LACTICIDVEN Microbiology Recent Results (from the past 240 hour(s))  Resp Panel by RT-PCR (Flu A&B, Covid) Nasopharyngeal Swab     Status: None   Collection Time: 03/21/21  4:26 AM   Specimen: Nasopharyngeal Swab; Nasopharyngeal(NP) swabs in vial transport medium  Result Value Ref Range Status   SARS Coronavirus 2  by RT PCR NEGATIVE NEGATIVE  Final    Comment: (NOTE) SARS-CoV-2 target nucleic acids are NOT DETECTED.  The SARS-CoV-2 RNA is generally detectable in upper respiratory specimens during the acute phase of infection. The lowest concentration of SARS-CoV-2 viral copies this assay can detect is 138 copies/mL. A negative result does not preclude SARS-Cov-2 infection and should not be used as the sole basis for treatment or other patient management decisions. A negative result may occur with  improper specimen collection/handling, submission of specimen other than nasopharyngeal swab, presence of viral mutation(s) within the areas targeted by this assay, and inadequate number of viral copies(<138 copies/mL). A negative result must be combined with clinical observations, patient history, and epidemiological information. The expected result is Negative.  Fact Sheet for Patients:  EntrepreneurPulse.com.au  Fact Sheet for Healthcare Providers:  IncredibleEmployment.be  This test is no t yet approved or cleared by the [place] FDA and  has been authorized for detection and/or diagnosis of SARS-CoV-2 by FDA under an Emergency Use Authorization (EUA). This EUA will remain  in effect (meaning this test can be used) for the duration of the COVID-19 declaration under Section 564(b)(1) of the Act, 21 U.S.C.section 360bbb-3(b)(1), unless the authorization is terminated  or revoked sooner.       Influenza A by PCR NEGATIVE NEGATIVE Final   Influenza B by PCR NEGATIVE NEGATIVE Final    Comment: (NOTE) The Xpert Xpress SARS-CoV-2/FLU/RSV plus assay is intended as an aid in the diagnosis of influenza from Nasopharyngeal swab specimens and should not be used as a sole basis for treatment. Nasal washings and aspirates are unacceptable for Xpert Xpress SARS-CoV-2/FLU/RSV testing.  Fact Sheet for Patients: EntrepreneurPulse.com.au  Fact Sheet for Healthcare  Providers: IncredibleEmployment.be  This test is not yet approved or cleared by the [place] FDA and has been authorized for detection and/or diagnosis of SARS-CoV-2 by FDA under an Emergency Use Authorization (EUA). This EUA will remain in effect (meaning this test can be used) for the duration of the COVID-19 declaration under Section 564(b)(1) of the Act, 21 U.S.C. section 360bbb-3(b)(1), unless the authorization is terminated or revoked.  Performed at [facility], [address], [place], [place] [address]   Surgical pcr screen     Status: Abnormal   Collection Time: 03/23/21  6:58 AM   Specimen: Nasal Mucosa; Nasal Swab  Result Value Ref Range Status   MRSA, PCR NEGATIVE NEGATIVE Final   Staphylococcus aureus POSITIVE (A) NEGATIVE Final    Comment: (NOTE) The Xpert SA Assay (FDA approved for NASAL specimens in patients 63 years of age and older), is one component of a comprehensive surveillance program. It is not intended to diagnose infection nor to guide or monitor treatment. Performed at [facility], [place] [address], [place], [place] [address]     Please note: You were cared for by a hospitalist during your hospital stay. Once you are discharged, your primary care physician will handle any further medical issues. Please note that NO REFILLS for any discharge medications will be authorized once you are discharged, as it is imperative that you return to your primary care physician (or establish a relationship with a primary care physician if you do not have one) for your post hospital discharge needs so that they can reassess your need for medications and monitor your lab values.    Time coordinating discharge: 40 minutes  SIGNED:   Shelly Coss, MD  [facility] 03/27/2021, 10:16 AM Pager [phone]  If 7PM-7AM, please contact night-coverage [email or web address]

## 2021-03-27 NOTE — TOC Transition Note (Signed)
Transition of Care (TOC) - CM/SW Discharge Note ? ? ?Patient Details  ?Name: Julie Martinez ?MRN: [id number] ?Date of Birth: 1931/07/16 ? ?Transition of Care [facility]) CM/SW Contact:  ?Joanne Chars, LCSW ?Phone Number: ?03/27/2021, 2:36 PM ? ? ?Clinical Narrative:   Pt discharging to [facility].  RN call [phone] for report.   ? ? ? ?Final next level of care: [facility] ?Barriers to Discharge: Barriers Resolved ? ? ?Patient Goals and CMS Choice ?  ?  ?  ? ?Discharge Placement ?  ?           ?Patient chooses bed at:  [facility]) ?Patient to be transferred to facility by: [facility] ?Name of family member notified: daughter [name] ?Patient and family notified of of transfer: 03/27/21 ? ?Discharge Plan and Services ?In-house Referral: Clinical Social Work ?  ?           ?  ?  ?  ?  ?  ?  ?  ?  ?  ?  ? ?Social Determinants of Health (SDOH) Interventions ?  ? ? ?Readmission Risk Interventions ?No flowsheet data found. ? ? ? ? ?

## 2021-03-27 NOTE — Progress Notes (Signed)
Patient evaluated today.  Stable from standpoint of left hip.  Pain seems controlled and patient is resting comfortably in the chair.  History is limited due to patient's history of dementia but she does not currently complain of pain upon interview.  Dressing is intact with no surrounding erythema, gross blood or drainage.  Patient actively plantar flexes and dorsiflexes her ankle without difficulty.  She has no asymmetric swelling or tenderness over the left calf.  Plan is discharge to skilled nursing facility today.  Follow-up with Dr. Marlou Sa in clinic at 2 weeks postoperatively ?

## 2021-03-28 ENCOUNTER — Non-Acute Institutional Stay (SKILLED_NURSING_FACILITY): Payer: Medicare HMO | Admitting: Adult Health

## 2021-03-28 ENCOUNTER — Encounter: Payer: Self-pay | Admitting: Adult Health

## 2021-03-28 DIAGNOSIS — Z86711 Personal history of pulmonary embolism: Secondary | ICD-10-CM | POA: Diagnosis not present

## 2021-03-28 DIAGNOSIS — F03C0 Unspecified dementia, severe, without behavioral disturbance, psychotic disturbance, mood disturbance, and anxiety: Secondary | ICD-10-CM | POA: Diagnosis not present

## 2021-03-28 DIAGNOSIS — Z86718 Personal history of other venous thrombosis and embolism: Secondary | ICD-10-CM | POA: Diagnosis not present

## 2021-03-28 DIAGNOSIS — I10 Essential (primary) hypertension: Secondary | ICD-10-CM | POA: Diagnosis not present

## 2021-03-28 DIAGNOSIS — D62 Acute posthemorrhagic anemia: Secondary | ICD-10-CM | POA: Diagnosis not present

## 2021-03-28 DIAGNOSIS — S72002S Fracture of unspecified part of neck of left femur, sequela: Secondary | ICD-10-CM | POA: Diagnosis not present

## 2021-03-28 DIAGNOSIS — N1832 Chronic kidney disease, stage 3b: Secondary | ICD-10-CM

## 2021-03-28 NOTE — Progress Notes (Unsigned)
Location:      Place of Service:    Provider:  Durenda Age, DNP, FNP-BC  Patient Care Team: Cari Caraway, MD as PCP - General [facility])  Extended Emergency Contact Information Primary Emergency Contact: Pegg,Donna Address: [address]          [place], [place] [address] [place] Phone: [phone] Mobile Phone: [phone] Relation: Daughter Secondary Emergency Contact: Lowe,Denise Mobile Phone: [phone] Relation: Daughter  Code Status:  ***  Goals of care: Advanced Directive information Advanced Directives 03/28/2021  Does Patient Have a Medical Advance Directive? No  Type of Advance Directive -  Does patient want to make changes to medical advance directive? -  Copy of [place] in Chart? -  Would patient like information on creating a medical advance directive? -     Chief Complaint  Patient presents with   Hospitalization Follow-up    Hospital follow up    HPI:  Pt is a 88 y.o. female seen today for medical management of chronic diseases.  ***   Past Medical History:  Diagnosis Date   Anxiety    Chronic kidney disease    stage 3   Dementia (HCC)    Dyspnea    with exertion, no oxygen   High cholesterol    Hypertension    Osteopenia    Walker as ambulation aid    Past Surgical History:  Procedure Laterality Date   ABDOMINAL HYSTERECTOMY     COLONOSCOPY     EYE SURGERY Bilateral    eyelids   HIP ARTHROPLASTY Left 03/23/2021   Procedure: ARTHROPLASTY BIPOLAR HIP (HEMIARTHROPLASTY);  Surgeon: Meredith Pel, MD;  Location: [facility];  Service: Orthopedics;  Laterality: Left;   SHOULDER SURGERY  2017   plating for fracture left    No Known Allergies  Outpatient Encounter Medications as of 03/28/2021  Medication Sig   Acetaminophen (TYLENOL ARTHRITIS PAIN PO) Take 650 tablets by mouth every 8 (eight) hours as needed (pain).   amLODipine (NORVASC) 5 MG tablet Take 1 tablet (5 mg total) by  mouth daily.   apixaban (ELIQUIS) 2.5 MG TABS tablet Take 2.5 mg by mouth 2 (two) times daily.   cholecalciferol (VITAMIN D) 25 MCG (1000 UNIT) tablet Take 1,000 Units by mouth daily.   donepezil (ARICEPT) 10 MG tablet Take 1 tablet (10 mg total) by mouth at bedtime.   ferrous sulfate 325 (65 FE) MG tablet Take 1 tablet (325 mg total) by mouth daily with breakfast.   HYDROcodone-acetaminophen (NORCO/VICODIN) 5-325 MG tablet Take 1 tablet by mouth every 4 (four) hours as needed for moderate pain (pain score 4-6).   memantine (NAMENDA) 10 MG tablet Take 1 tablet (10 mg total) by mouth 2 (two) times daily.   mirtazapine (REMERON) 7.5 MG tablet Take 1 tablet (7.5 mg total) by mouth at bedtime.   polyethylene glycol (MIRALAX / GLYCOLAX) 17 g packet Take 17 g by mouth daily as needed for mild constipation.   vitamin B-12 (CYANOCOBALAMIN) 1000 MCG tablet Take 1,000 mcg by mouth daily.   [DISCONTINUED] docusate sodium (COLACE) 100 MG capsule Take 1 capsule (100 mg total) by mouth 2 (two) times daily.   [DISCONTINUED] Homeopathic Products (THERAWORX RELIEF EX) Apply 1 application topically daily as needed (leg pain).   No facility-administered encounter medications on file as of 03/28/2021.    Review of Systems DNR    Immunization History  Administered Date(s) Administered   Tdap 08/03/2015   Pertinent  Health Maintenance Due  Topic Date Due   DEXA SCAN  Never done   INFLUENZA VACCINE  Completed   Fall Risk 03/25/2021 03/25/2021 03/26/2021 03/26/2021 03/27/2021  Patient Fall Risk Level High fall risk High fall risk High fall risk High fall risk High fall risk     Vitals:   03/28/21 1050  BP: (!) 138/57  Pulse: 82  Temp: 97.9 F (36.6 C)  SpO2: 96%  Height: 5' (1.524 m)   Body mass index is 25.4 kg/m.  Physical Exam     Labs reviewed: Recent Labs    03/22/21 0210 03/23/21 0141 03/25/21 0249 03/26/21 0401 03/27/21 0153  NA 140   < > 140 144 145  K 4.5   < > 4.0 4.4 4.3  CL 110    < > 112* 115* 116*  CO2 22   < > 21* 21* 20*  GLUCOSE 128*   < > 130* 106* 104*  BUN 32*   < > 47* 43* 38*  CREATININE 1.36*   < > 1.47* 1.57* 1.37*  CALCIUM 8.8*   < > 8.0* 8.0* 8.4*  MG 2.0  --   --   --   --   PHOS 3.4  --   --   --   --    < > = values in this interval not displayed.   Recent Labs    03/21/21 0322 03/22/21 0210  AST 18  --   ALT 14  --   ALKPHOS 71  --   BILITOT 0.4  --   PROT 6.4*  --   ALBUMIN 3.4* 2.9*   Recent Labs    03/25/21 0249 03/26/21 0401 03/27/21 0153  WBC 11.4* 9.5 9.8  NEUTROABS 8.6* 7.0 6.8  HGB 8.0* 7.8* 8.2*  HCT 25.4* 24.6* 26.9*  MCV 101.6* 104.2* 103.5*  PLT 201 221 269   Lab Results  Component Value Date   TSH 1.200 01/25/2015   No results found for: HGBA1C No results found for: CHOL, HDL, LDLCALC, LDLDIRECT, TRIG, CHOLHDL  Significant Diagnostic Results in last 30 days:  DG Pelvis 1-2 Views  Result Date: 03/21/2021 CLINICAL DATA:  Fall, left hip pain EXAM: PELVIS - 1-2 VIEW COMPARISON:  None. FINDINGS: There is a pathologic fracture involving the subcapital left femoral neck with a lytic lesions seen within the superior aspect of the femoral neck and erosive changes noted along the fracture margin of the proximal femur. Marked varus angulation. Mild bilateral degenerative hip arthritis. No additional fracture or dislocation identified. No additional lytic or blastic bone lesion. IMPRESSION: Pathologic subcapital left femoral neck fracture with marked varus angulation. Electronically Signed   By: Fidela Salisbury M.D.   On: 03/21/2021 04:25   DG Knee 2 Views Left  Result Date: 03/21/2021 CLINICAL DATA:  Left leg pain EXAM: LEFT KNEE - 1-2 VIEW COMPARISON:  None. FINDINGS: Single view radiograph left knee demonstrates normal alignment on this limited examination. No definite fracture or dislocation. Joint spaces are not well profiled. IMPRESSION: Limited but unremarkable examination. Electronically Signed   By: Fidela Salisbury M.D.    On: 03/21/2021 04:26   CT Head Wo Contrast  Result Date: 03/21/2021 CLINICAL DATA:  Fall, head and neck trauma EXAM: CT HEAD WITHOUT CONTRAST CT CERVICAL SPINE WITHOUT CONTRAST TECHNIQUE: Multidetector CT imaging of the head and cervical spine was performed following the standard protocol without intravenous contrast. Multiplanar CT image reconstructions of the cervical spine were also generated. RADIATION DOSE REDUCTION: This exam was  performed according to the departmental dose-optimization program which includes automated exposure control, adjustment of the mA and/or kV according to patient size and/or use of iterative reconstruction technique. COMPARISON:  None. FINDINGS: CT HEAD FINDINGS Brain: Normal anatomic configuration. Parenchymal volume loss is commensurate with the patient's age. Mild periventricular white matter changes are present likely reflecting the sequela of small vessel ischemia. No abnormal intra or extra-axial mass lesion or fluid collection. No abnormal mass effect or midline shift. No evidence of acute intracranial hemorrhage or infarct. Ventricular size is normal. Cerebellum unremarkable. Vascular: No asymmetric hyperdense vasculature at the skull base. Skull: Intact Sinuses/Orbits: Paranasal sinuses are clear. Ocular lenses have been removed. Orbits are otherwise unremarkable. Other: Mastoid air cells and middle ear cavities are clear. CT CERVICAL SPINE FINDINGS Alignment: There is extension weighted cervical lordosis, likely positional in nature. 2 mm anterolisthesis C4-5. Skull base and vertebrae: Craniocervical alignment is normal. The atlantodental interval is not widened. There is a mild remote appearing compression deformity of C3 with minimal loss of height and minimal buckling of the posterior cortex without frank retropulsion. A remote appearing inferior endplate fracture of C6 is noted. No acute fracture of the cervical spine. Soft tissues and spinal canal: No prevertebral  fluid or swelling. No visible canal hematoma. Disc levels: There is intervertebral disc space narrowing and endplate remodeling of R4-Y7, most severe at C5-6 and C6-7 in keeping with changes of moderate to severe degenerative disc disease. Prevertebral soft tissues are not thickened on sagittal reformats. Spinal canal is widely patent. Review of the axial images demonstrates multilevel uncovertebral and facet arthrosis resulting in mild neuroforaminal narrowing on the right at C4-5. Upper chest: Biapical scarring noted. Other: None IMPRESSION: No acute intracranial injury.  No calvarial fracture. No acute fracture or listhesis of the cervical spine. Electronically Signed   By: Fidela Salisbury M.D.   On: 03/21/2021 04:23   CT Cervical Spine Wo Contrast  Result Date: 03/21/2021 CLINICAL DATA:  Fall, head and neck trauma EXAM: CT HEAD WITHOUT CONTRAST CT CERVICAL SPINE WITHOUT CONTRAST TECHNIQUE: Multidetector CT imaging of the head and cervical spine was performed following the standard protocol without intravenous contrast. Multiplanar CT image reconstructions of the cervical spine were also generated. RADIATION DOSE REDUCTION: This exam was performed according to the departmental dose-optimization program which includes automated exposure control, adjustment of the mA and/or kV according to patient size and/or use of iterative reconstruction technique. COMPARISON:  None. FINDINGS: CT HEAD FINDINGS Brain: Normal anatomic configuration. Parenchymal volume loss is commensurate with the patient's age. Mild periventricular white matter changes are present likely reflecting the sequela of small vessel ischemia. No abnormal intra or extra-axial mass lesion or fluid collection. No abnormal mass effect or midline shift. No evidence of acute intracranial hemorrhage or infarct. Ventricular size is normal. Cerebellum unremarkable. Vascular: No asymmetric hyperdense vasculature at the skull base. Skull: Intact Sinuses/Orbits:  Paranasal sinuses are clear. Ocular lenses have been removed. Orbits are otherwise unremarkable. Other: Mastoid air cells and middle ear cavities are clear. CT CERVICAL SPINE FINDINGS Alignment: There is extension weighted cervical lordosis, likely positional in nature. 2 mm anterolisthesis C4-5. Skull base and vertebrae: Craniocervical alignment is normal. The atlantodental interval is not widened. There is a mild remote appearing compression deformity of C3 with minimal loss of height and minimal buckling of the posterior cortex without frank retropulsion. A remote appearing inferior endplate fracture of C6 is noted. No acute fracture of the cervical spine. Soft tissues and spinal canal: No prevertebral  fluid or swelling. No visible canal hematoma. Disc levels: There is intervertebral disc space narrowing and endplate remodeling of G1-W2, most severe at C5-6 and C6-7 in keeping with changes of moderate to severe degenerative disc disease. Prevertebral soft tissues are not thickened on sagittal reformats. Spinal canal is widely patent. Review of the axial images demonstrates multilevel uncovertebral and facet arthrosis resulting in mild neuroforaminal narrowing on the right at C4-5. Upper chest: Biapical scarring noted. Other: None IMPRESSION: No acute intracranial injury.  No calvarial fracture. No acute fracture or listhesis of the cervical spine. Electronically Signed   By: Fidela Salisbury M.D.   On: 03/21/2021 04:23   CT PELVIS WO CONTRAST  Result Date: 03/21/2021 CLINICAL DATA:  89 year old female status post fall with hip pain. Proximal left femur fracture. EXAM: CT PELVIS WITHOUT CONTRAST TECHNIQUE: Multidetector CT imaging of the pelvis was performed following the standard protocol without intravenous contrast. RADIATION DOSE REDUCTION: This exam was performed according to the departmental dose-optimization program which includes automated exposure control, adjustment of the mA and/or kV according to  patient size and/or use of iterative reconstruction technique. COMPARISON:  Lumbar MRI 06/17/2016. FINDINGS: Urinary Tract: Kidneys not included. No hydroureter. Unremarkable bladder. Incidental pelvic phleboliths. Bowel: No dilated large or small bowel. Diverticulosis throughout the sigmoid and visible descending colon. No bowel inflammation identified. No pneumoperitoneum identified. However, there is a small right inguinal hernia containing a small bowel loop and adjacent mesentery (series 4, image 325). The hernia is approximately 4 cm in length, and does not appear incarcerated. Vascular/Lymphatic: Vascular patency is not evaluated in the absence of IV contrast. Calcified atherosclerosis at the aortoiliac bifurcation. No inguinal or pelvic lymphadenopathy identified. Reproductive: Surgically absent uterus. Normal ovaries along the pelvic sidewalls. Other:  No pelvic free fluid. Musculoskeletal: Moderate to severe L4 compression fracture corresponding to that seen in 2018 with superior endplate deformity and retropulsion of the posterosuperior endplate contributing to severe L3-L4 spinal stenosis which is partially visible. L5 vertebra, sacrum and SI joints appear intact with relatively typical osteopenia for age. There are chronic bilateral pubic rami fractures. Iliac wing and pelvic bone mineralization also appears age-appropriate. Proximal right femur is intact with no discrete bone lesion. Subcapital left femoral neck fracture with varus impaction and posterior rotation of the femoral head is noted. There is a conspicuous roughly 18 mm radiolucent lesion along the superolateral femoral head fragment as seen radiographically (series 6, image 79). But the other proximal left femur bone mineralization is normal for age. No significant hematoma about the hip fracture. Superficial body wall and pelvic, proximal lower extremity muscular Cher in general appears normal. IMPRESSION: 1. Acute, impacted subcapital left  femoral neck fracture with an 18 mm lucent bone lesion along the fracture margin as seen radiographically. This does suggest a pathologic fracture, but no other primary bone lesions are identified about the pelvis. Age related osteopenia is noted. 2. No other acute osseous abnormality identified. Chronic bilateral pubic rami fractures. Chronic L4 compression fracture with retropulsion and severe L3-L4 spinal stenosis stable since 2018. 3. Small right inguinal hernia containing a small bowel loop and mesentery does not appear incarcerated. Diverticulosis of the distal colon. Aortic Atherosclerosis (ICD10-I70.0). Electronically Signed   By: Genevie Ann M.D.   On: 03/21/2021 04:55   Pelvis Portable  Result Date: 03/23/2021 CLINICAL DATA:  Status post hip arthroplasty EXAM: PORTABLE PELVIS 1-2 VIEWS COMPARISON:  March 21, 2021 FINDINGS: Status post LEFT hip arthroplasty. Orthopedic hardware is intact and without periprosthetic fracture or  lucency. Soft tissue air consistent with recent surgical intervention. Pelvic phleboliths. Osteopenia. Degenerative changes of the lower lumbar spine. IMPRESSION: Expected postsurgical changes status post LEFT hip arthroplasty. Electronically Signed   By: Valentino Saxon M.D.   On: 03/23/2021 12:11   DG C-Arm 1-60 Min-No Report  Result Date: 03/23/2021 Fluoroscopy was utilized by the requesting physician.  No radiographic interpretation.   DG C-Arm 1-60 Min-No Report  Result Date: 03/23/2021 Fluoroscopy was utilized by the requesting physician.  No radiographic interpretation.   DG HIP UNILAT WITH PELVIS 1V LEFT  Result Date: 03/23/2021 CLINICAL DATA:  Left hip arthroplasty EXAM: DG HIP (WITH OR WITHOUT PELVIS) 1V*L* COMPARISON:  None. FINDINGS: Multiple intraoperative fluoroscopic spot images are provided. Interval left total hip arthroplasty. Normal alignment. FLUOROSCOPY TIME:  Fluoroscopy Time:  12.4 seconds Radiation Exposure Index (if provided by the fluoroscopic  device): 1.2 mGy Number of Acquired Spot Images: 0 IMPRESSION: 1. Interval left total hip arthroplasty. Electronically Signed   By: Kathreen Devoid M.D.   On: 03/23/2021 10:25   DG Femur Min 2 Views Left  Result Date: 03/21/2021 CLINICAL DATA:  Left hip fracture EXAM: LEFT FEMUR 2 VIEWS COMPARISON:  None. FINDINGS: There is a pathologic subcapital femoral neck fracture of the left hip with a lytic lesion involving the superior aspect of the subcapital femoral neck. Override and marked varus angulation of the femoral shaft. Femoral head is still seated within the left acetabulum with superimposed mild-to-moderate degenerative hip arthritis. No additional fracture or dislocation. No additional lytic or blastic bone lesion. IMPRESSION: Pathologic left subcapital femoral neck fracture with override and marked varus angulation. Contrast enhanced MRI examination and bone scintigraphy may be helpful for further evaluation. Electronically Signed   By: Fidela Salisbury M.D.   On: 03/21/2021 04:29   VAS [place] LOWER EXTREMITY VENOUS (DVT)  Result Date: 03/23/2021  Lower Venous DVT Study Patient Name:  OMMIE DEGEORGE  Date of Exam:   03/22/2021 Medical Rec #: [id number]         Accession #:    [id number] Date of Birth: Apr 17, 1931         Patient Gender: F Patient Age:   17 years Exam Location:  [facility] Procedure:      VAS [place] LOWER EXTREMITY VENOUS (DVT) Referring Phys: Irene Pap --------------------------------------------------------------------------------  Indications: Pulmonary embolism.  Risk Factors: Trauma. Limitations: Poor ultrasound/tissue interface and patient positioning, patient immobility, patient pain tolerance, poor patient cooperation. Comparison Study: No prior studies. Performing Technologist: Oliver Hum RVT  Examination Guidelines: A complete evaluation includes B-mode imaging, spectral Doppler, color Doppler, and power Doppler as needed of all accessible portions of each vessel. Bilateral  testing is considered an integral part of a complete examination. Limited examinations for reoccurring indications may be performed as noted. The reflux portion of the exam is performed with the patient in reverse Trendelenburg.  +---------+---------------+---------+-----------+----------+-------------------+  RIGHT     Compressibility Phasicity Spontaneity Properties Thrombus Aging       +---------+---------------+---------+-----------+----------+-------------------+  CFV       Full            Yes       Yes                                         +---------+---------------+---------+-----------+----------+-------------------+  SFJ       Full                                                                  +---------+---------------+---------+-----------+----------+-------------------+  FV Prox   Full                                                                  +---------+---------------+---------+-----------+----------+-------------------+  FV Mid                    Yes       Yes                                         +---------+---------------+---------+-----------+----------+-------------------+  FV Distal                 Yes       Yes                                         +---------+---------------+---------+-----------+----------+-------------------+  PFV       Full                                                                  +---------+---------------+---------+-----------+----------+-------------------+  POP       Full            Yes       Yes                                         +---------+---------------+---------+-----------+----------+-------------------+  PTV       Full                                                                  +---------+---------------+---------+-----------+----------+-------------------+  PERO                                                       Not well visualized  +---------+---------------+---------+-----------+----------+-------------------+    +---------+---------------+---------+-----------+----------+-------------------+  LEFT      Compressibility Phasicity Spontaneity Properties Thrombus Aging       +---------+---------------+---------+-----------+----------+-------------------+  CFV       Full            Yes       Yes                                         +---------+---------------+---------+-----------+----------+-------------------+  SFJ       Full                                                                  +---------+---------------+---------+-----------+----------+-------------------+  FV Prox   Full                                                                  +---------+---------------+---------+-----------+----------+-------------------+  FV Mid                    Yes       Yes                                         +---------+---------------+---------+-----------+----------+-------------------+  FV Distal                 Yes       Yes                                         +---------+---------------+---------+-----------+----------+-------------------+  PFV       Full                                                                  +---------+---------------+---------+-----------+----------+-------------------+  POP                                                        Not well visualized  +---------+---------------+---------+-----------+----------+-------------------+  PTV       Full                                                                  +---------+---------------+---------+-----------+----------+-------------------+  PERO                                                       Not well visualized  +---------+---------------+---------+-----------+----------+-------------------+     Summary: RIGHT: - There is no evidence of deep vein thrombosis in the lower extremity. However, portions of this examination were limited- see technologist comments above.  - No cystic structure found in the popliteal fossa.  LEFT: - There is  no evidence of deep vein thrombosis in the lower extremity. However, portions of this examination were limited- see technologist comments above.  - No cystic structure found in the popliteal fossa.  *See table(s) above for measurements and observations. Electronically signed by Orlie Pollen on 03/23/2021 at 10:15:16 AM.    Final     Assessment/Plan ***   Family/ staff Communication: Discussed plan of care with resident and  charge nurse  Labs/tests ordered:     Durenda Age, DNP, MSN, FNP-BC [facility] [phone] (Monday-Friday 8:00 a.m. - 5:00 p.m.) [phone] (after hours)

## 2021-03-29 ENCOUNTER — Non-Acute Institutional Stay (SKILLED_NURSING_FACILITY): Payer: Medicare HMO | Admitting: Internal Medicine

## 2021-03-29 ENCOUNTER — Encounter: Payer: Self-pay | Admitting: Internal Medicine

## 2021-03-29 DIAGNOSIS — F03C0 Unspecified dementia, severe, without behavioral disturbance, psychotic disturbance, mood disturbance, and anxiety: Secondary | ICD-10-CM

## 2021-03-29 DIAGNOSIS — N1831 Chronic kidney disease, stage 3a: Secondary | ICD-10-CM

## 2021-03-29 DIAGNOSIS — S72002G Fracture of unspecified part of neck of left femur, subsequent encounter for closed fracture with delayed healing: Secondary | ICD-10-CM

## 2021-03-29 DIAGNOSIS — D62 Acute posthemorrhagic anemia: Secondary | ICD-10-CM | POA: Diagnosis not present

## 2021-03-29 NOTE — Assessment & Plan Note (Addendum)
No bleeding dyscrasias reported at [facility]. ?Continue iron & B12 supplements ?

## 2021-03-29 NOTE — Assessment & Plan Note (Addendum)
Patient does not describe any left hip pain but this is in the context of severe advanced dementia.  She appears to have some opioid-induced bowel distention.  Opioid will be discontinued and she will be placed on a regular schedule of Tylenol.  This was discussed with her daughter. ?PT/OT at SNF as dementia allows. ?

## 2021-03-29 NOTE — Progress Notes (Signed)
? ?NURSING HOME LOCATION:  [place] ?ROOM NUMBER: [id number] ? ?CODE STATUS:  Full ? ?PCP: Cari Caraway, MD ? ?This is a comprehensive admission note to this SNFperformed on this date less than 30 days from date of admission. ?Included are preadmission medical/surgical history; reconciled medication list; family history; social history and comprehensive review of systems.  ?Corrections and additions to the records were documented. Comprehensive physical exam was also performed. Additionally a clinical summary was entered for each active diagnosis pertinent to this admission in the Problem List to enhance continuity of care. ? ?HPI: She was hospitalized 3/3 - 03/27/2021 presenting the ED from home after a fall.  Daughter provided history that the patient had gotten out of bed and taken a few steps prior to falling.  There was no loss of consciousness.  The patient has advanced dementia and can provide no history.  There did not appear to be any cardiac or neurologic prodrome prior to the fall according to the daughter. ?Left pelvic imaging revealed pathologic subcapital left femoral neck fracture with mild varus angulation.Left hip arthroplasty completed 3/5 by Dr Marlou Sa. ?At admission creatinine was 1.40 with a GFR of 36 indicating stage III b CKD.  H/H was 11.6/36.9 with MCV of 101.9.  Peak creatinine was 1.66 and nadir GFR was 29 on 3/5.  At discharge creatinine was 1.37 with GFR of 37 indicating essentially stable CKD stage IIIb.  Nadir H/H was 7.8/24.6 on 3/8.  H/H prior to discharge was 8.2/26.9.  B12 level was 373 and folate normal at 18.3. ?PT/OT recommended SNF placement for rehab. ? ?Past medical and surgical history: Includes past medical history of DVT/PTE, dementia ,dyslipidemia, essential hypertension, osteopenia,anxiety & CKD. ?Surgeries and procedures include abdominal hysterectomy, colonoscopy, and shoulder surgery.   ? ?Social history: Nondrinker; never smoked. ? ?Family  history: Limited history reviewed; this is noncontributory due to advanced age. ?  ?Review of systems: History was provided by her daughter [name].  The patient could provide no history as she has advanced dementia.  She gave the year as 1999 and stated that she cannot remember the name of the [place].  She could not give me her daughter's date of birth.  She did not know why she had been in the hospital.  She initially seemed to describe some left upper quadrant pain as she placed her hand over that area.  As the interview proceeded I asked her about pain and she then mentioned pain in the left knee and then finally shortly thereafter was talking about pain in the feet and legs.  At no time did she describe pain in the left hip. ?The only persistent sign noted by her daughter PTA was oliguria. ? ?Physical exam:  ?Pertinent or positive findings: Hair is disheveled and there is alopecia over the crown.  She sat in the chair repeatedly humming when she was not answering questions.  Eyebrows are absent.  She has marked ectropion of both lower lids.  She is wearing only the upper plate.  There is slight hirsutism over the chin.  Heart sounds are slightly distant.  A gallop cadence is noted.  Breath sounds are decreased. Abdomen is distended and firm but nontender to palpation.  Bowel sounds are decreased.  Pedal pulses are decreased.  She has 1/2+ edema at the ankles.  There is eschar at the left distal shin.  There is a dressing over the left hip and also 1 at the right elbow.  Osteoarthritic changes  of the hands are present.  She has bruising over the upper extremities in a scattered distribution. ? ?General appearance:  no acute distress, increased work of breathing is present.   ?Lymphatic: No lymphadenopathy about the head, neck, axilla. ?Eyes: No conjunctival inflammation or lid edema is present. There is no scleral icterus. ?Ears:  External ear exam shows no significant lesions or deformities.   ?Nose:  External  nasal examination shows no deformity or inflammation. Nasal mucosa are pink and moist without lesions, exudates ?Oral exam: Lips and gums are healthy appearing.There is no oropharyngeal erythema or exudate. ?Neck:  No thyromegaly, masses, tenderness noted.    ?Heart:  No murmur, click, rub.  ?Lungs:  without wheezes, rhonchi, rales, rubs. ?Abdomen: no organomegaly, hernias, masses. ?GU: Deferred  ?Extremities:  No cyanosis, clubbing ?Neurologic exam: Balance, Rhomberg, finger to nose testing could not be completed due to clinical state ?Skin: Warm & dry w/o tenting. ?No significant  rash. ? ?See clinical summary under each active problem in the Problem List with associated updated therapeutic plan ? ?

## 2021-03-29 NOTE — Patient Instructions (Signed)
See assessment and plan under each diagnosis in the problem list and acutely for this visit 

## 2021-03-29 NOTE — Assessment & Plan Note (Signed)
3/3 - 03/27/2021 creatinine 1.40 and GFR 36 at presentation.  Peak creatinine 1.66 and nadir GFR 29.  At discharge creatinine 1.37 and GFR 37 indicating CKD stage IIIb.  No change in present medications unless there is progression of CKD. ?

## 2021-03-29 NOTE — Assessment & Plan Note (Addendum)
Patient cannot provide the date, even the year.  She states that she "forgot" the POTUS's name.  She could not give me her daughter's date of birth.  She cannot tell me why she had been in the hospital. ?

## 2021-03-31 ENCOUNTER — Emergency Department (HOSPITAL_COMMUNITY): Payer: Medicare HMO

## 2021-03-31 ENCOUNTER — Other Ambulatory Visit: Payer: Self-pay

## 2021-03-31 ENCOUNTER — Inpatient Hospital Stay (HOSPITAL_COMMUNITY)
Admission: EM | Admit: 2021-03-31 | Discharge: 2021-04-03 | DRG: 963 | Disposition: A | Discharge status: Hospice | Payer: Medicare HMO | Source: Skilled Nursing Facility | Attending: Internal Medicine | Admitting: Internal Medicine

## 2021-03-31 ENCOUNTER — Encounter (HOSPITAL_COMMUNITY): Payer: Self-pay

## 2021-03-31 DIAGNOSIS — S51812A Laceration without foreign body of left forearm, initial encounter: Secondary | ICD-10-CM | POA: Diagnosis present

## 2021-03-31 DIAGNOSIS — I629 Nontraumatic intracranial hemorrhage, unspecified: Secondary | ICD-10-CM | POA: Diagnosis not present

## 2021-03-31 DIAGNOSIS — Z9181 History of falling: Secondary | ICD-10-CM | POA: Diagnosis not present

## 2021-03-31 DIAGNOSIS — S32511A Fracture of superior rim of right pubis, initial encounter for closed fracture: Secondary | ICD-10-CM | POA: Diagnosis not present

## 2021-03-31 DIAGNOSIS — S066XAA Traumatic subarachnoid hemorrhage with loss of consciousness status unknown, initial encounter: Principal | ICD-10-CM | POA: Diagnosis present

## 2021-03-31 DIAGNOSIS — Z79899 Other long term (current) drug therapy: Secondary | ICD-10-CM | POA: Diagnosis not present

## 2021-03-31 DIAGNOSIS — R296 Repeated falls: Secondary | ICD-10-CM | POA: Diagnosis present

## 2021-03-31 DIAGNOSIS — Z8249 Family history of ischemic heart disease and other diseases of the circulatory system: Secondary | ICD-10-CM | POA: Diagnosis not present

## 2021-03-31 DIAGNOSIS — J9601 Acute respiratory failure with hypoxia: Secondary | ICD-10-CM | POA: Diagnosis present

## 2021-03-31 DIAGNOSIS — R6510 Systemic inflammatory response syndrome (SIRS) of non-infectious origin without acute organ dysfunction: Secondary | ICD-10-CM | POA: Diagnosis present

## 2021-03-31 DIAGNOSIS — Z7901 Long term (current) use of anticoagulants: Secondary | ICD-10-CM | POA: Diagnosis not present

## 2021-03-31 DIAGNOSIS — Z86711 Personal history of pulmonary embolism: Secondary | ICD-10-CM | POA: Diagnosis present

## 2021-03-31 DIAGNOSIS — Z20822 Contact with and (suspected) exposure to covid-19: Secondary | ICD-10-CM | POA: Diagnosis present

## 2021-03-31 DIAGNOSIS — S2241XA Multiple fractures of ribs, right side, initial encounter for closed fracture: Secondary | ICD-10-CM | POA: Diagnosis not present

## 2021-03-31 DIAGNOSIS — R0602 Shortness of breath: Secondary | ICD-10-CM

## 2021-03-31 DIAGNOSIS — S065XAA Traumatic subdural hemorrhage with loss of consciousness status unknown, initial encounter: Secondary | ICD-10-CM | POA: Diagnosis present

## 2021-03-31 DIAGNOSIS — Z6825 Body mass index (BMI) 25.0-25.9, adult: Secondary | ICD-10-CM

## 2021-03-31 DIAGNOSIS — R54 Age-related physical debility: Secondary | ICD-10-CM | POA: Diagnosis present

## 2021-03-31 DIAGNOSIS — I10 Essential (primary) hypertension: Secondary | ICD-10-CM | POA: Diagnosis present

## 2021-03-31 DIAGNOSIS — K8020 Calculus of gallbladder without cholecystitis without obstruction: Secondary | ICD-10-CM | POA: Diagnosis not present

## 2021-03-31 DIAGNOSIS — Y92122 Bedroom in nursing home as the place of occurrence of the external cause: Secondary | ICD-10-CM | POA: Diagnosis not present

## 2021-03-31 DIAGNOSIS — R338 Other retention of urine: Secondary | ICD-10-CM | POA: Diagnosis not present

## 2021-03-31 DIAGNOSIS — Z96642 Presence of left artificial hip joint: Secondary | ICD-10-CM | POA: Diagnosis present

## 2021-03-31 DIAGNOSIS — I2510 Atherosclerotic heart disease of native coronary artery without angina pectoris: Secondary | ICD-10-CM | POA: Diagnosis not present

## 2021-03-31 DIAGNOSIS — S065X0A Traumatic subdural hemorrhage without loss of consciousness, initial encounter: Secondary | ICD-10-CM | POA: Diagnosis not present

## 2021-03-31 DIAGNOSIS — I129 Hypertensive chronic kidney disease with stage 1 through stage 4 chronic kidney disease, or unspecified chronic kidney disease: Secondary | ICD-10-CM | POA: Diagnosis present

## 2021-03-31 DIAGNOSIS — W06XXXA Fall from bed, initial encounter: Secondary | ICD-10-CM | POA: Diagnosis present

## 2021-03-31 DIAGNOSIS — S066X0A Traumatic subarachnoid hemorrhage without loss of consciousness, initial encounter: Secondary | ICD-10-CM | POA: Diagnosis not present

## 2021-03-31 DIAGNOSIS — S3991XA Unspecified injury of abdomen, initial encounter: Secondary | ICD-10-CM | POA: Diagnosis not present

## 2021-03-31 DIAGNOSIS — F03C0 Unspecified dementia, severe, without behavioral disturbance, psychotic disturbance, mood disturbance, and anxiety: Secondary | ICD-10-CM | POA: Diagnosis not present

## 2021-03-31 DIAGNOSIS — Z90710 Acquired absence of both cervix and uterus: Secondary | ICD-10-CM | POA: Diagnosis not present

## 2021-03-31 DIAGNOSIS — Z86718 Personal history of other venous thrombosis and embolism: Secondary | ICD-10-CM | POA: Diagnosis not present

## 2021-03-31 DIAGNOSIS — I609 Nontraumatic subarachnoid hemorrhage, unspecified: Secondary | ICD-10-CM

## 2021-03-31 DIAGNOSIS — I959 Hypotension, unspecified: Secondary | ICD-10-CM | POA: Diagnosis not present

## 2021-03-31 DIAGNOSIS — M25552 Pain in left hip: Secondary | ICD-10-CM | POA: Diagnosis not present

## 2021-03-31 DIAGNOSIS — S0083XA Contusion of other part of head, initial encounter: Secondary | ICD-10-CM | POA: Diagnosis not present

## 2021-03-31 DIAGNOSIS — S72009A Fracture of unspecified part of neck of unspecified femur, initial encounter for closed fracture: Secondary | ICD-10-CM | POA: Diagnosis not present

## 2021-03-31 DIAGNOSIS — Z515 Encounter for palliative care: Secondary | ICD-10-CM

## 2021-03-31 DIAGNOSIS — R600 Localized edema: Secondary | ICD-10-CM | POA: Diagnosis present

## 2021-03-31 DIAGNOSIS — Z66 Do not resuscitate: Secondary | ICD-10-CM | POA: Diagnosis not present

## 2021-03-31 DIAGNOSIS — S51012A Laceration without foreign body of left elbow, initial encounter: Secondary | ICD-10-CM | POA: Diagnosis present

## 2021-03-31 DIAGNOSIS — S32519A Fracture of superior rim of unspecified pubis, initial encounter for closed fracture: Secondary | ICD-10-CM | POA: Diagnosis present

## 2021-03-31 DIAGNOSIS — M4312 Spondylolisthesis, cervical region: Secondary | ICD-10-CM | POA: Diagnosis not present

## 2021-03-31 DIAGNOSIS — I619 Nontraumatic intracerebral hemorrhage, unspecified: Secondary | ICD-10-CM | POA: Diagnosis present

## 2021-03-31 DIAGNOSIS — R339 Retention of urine, unspecified: Secondary | ICD-10-CM | POA: Diagnosis present

## 2021-03-31 DIAGNOSIS — F0390 Unspecified dementia without behavioral disturbance: Secondary | ICD-10-CM | POA: Diagnosis present

## 2021-03-31 DIAGNOSIS — E7800 Pure hypercholesterolemia, unspecified: Secondary | ICD-10-CM | POA: Diagnosis present

## 2021-03-31 DIAGNOSIS — M47812 Spondylosis without myelopathy or radiculopathy, cervical region: Secondary | ICD-10-CM | POA: Diagnosis not present

## 2021-03-31 DIAGNOSIS — S0003XA Contusion of scalp, initial encounter: Secondary | ICD-10-CM | POA: Diagnosis not present

## 2021-03-31 DIAGNOSIS — N1832 Chronic kidney disease, stage 3b: Secondary | ICD-10-CM | POA: Diagnosis present

## 2021-03-31 DIAGNOSIS — S3993XA Unspecified injury of pelvis, initial encounter: Secondary | ICD-10-CM | POA: Diagnosis not present

## 2021-03-31 DIAGNOSIS — J90 Pleural effusion, not elsewhere classified: Secondary | ICD-10-CM | POA: Diagnosis not present

## 2021-03-31 DIAGNOSIS — K449 Diaphragmatic hernia without obstruction or gangrene: Secondary | ICD-10-CM | POA: Diagnosis not present

## 2021-03-31 DIAGNOSIS — W19XXXA Unspecified fall, initial encounter: Secondary | ICD-10-CM | POA: Diagnosis present

## 2021-03-31 DIAGNOSIS — M8580 Other specified disorders of bone density and structure, unspecified site: Secondary | ICD-10-CM | POA: Diagnosis present

## 2021-03-31 DIAGNOSIS — F419 Anxiety disorder, unspecified: Secondary | ICD-10-CM | POA: Diagnosis present

## 2021-03-31 DIAGNOSIS — J8410 Pulmonary fibrosis, unspecified: Secondary | ICD-10-CM | POA: Diagnosis not present

## 2021-03-31 DIAGNOSIS — Z7401 Bed confinement status: Secondary | ICD-10-CM | POA: Diagnosis not present

## 2021-03-31 DIAGNOSIS — R000 Tachycardia, unspecified: Secondary | ICD-10-CM | POA: Diagnosis not present

## 2021-03-31 DIAGNOSIS — S0993XA Unspecified injury of face, initial encounter: Secondary | ICD-10-CM | POA: Diagnosis not present

## 2021-03-31 DIAGNOSIS — J811 Chronic pulmonary edema: Secondary | ICD-10-CM | POA: Diagnosis not present

## 2021-03-31 DIAGNOSIS — T1490XA Injury, unspecified, initial encounter: Secondary | ICD-10-CM

## 2021-03-31 DIAGNOSIS — Z7189 Other specified counseling: Secondary | ICD-10-CM

## 2021-03-31 DIAGNOSIS — R58 Hemorrhage, not elsewhere classified: Secondary | ICD-10-CM | POA: Diagnosis not present

## 2021-03-31 DIAGNOSIS — S199XXA Unspecified injury of neck, initial encounter: Secondary | ICD-10-CM | POA: Diagnosis not present

## 2021-03-31 DIAGNOSIS — R630 Anorexia: Secondary | ICD-10-CM | POA: Diagnosis present

## 2021-03-31 LAB — CBC
HCT: 28.6 % — ABNORMAL LOW (ref 36.0–46.0)
Hemoglobin: 8.9 g/dL — ABNORMAL LOW (ref 12.0–15.0)
MCH: 33 pg (ref 26.0–34.0)
MCHC: 31.1 g/dL (ref 30.0–36.0)
MCV: 105.9 fL — ABNORMAL HIGH (ref 80.0–100.0)
Platelets: 424 10*3/uL — ABNORMAL HIGH (ref 150–400)
RBC: 2.7 MIL/uL — ABNORMAL LOW (ref 3.87–5.11)
RDW: 13.5 % (ref 11.5–15.5)
WBC: 19.5 10*3/uL — ABNORMAL HIGH (ref 4.0–10.5)
nRBC: 0.3 % — ABNORMAL HIGH (ref 0.0–0.2)

## 2021-03-31 LAB — COMPREHENSIVE METABOLIC PANEL
ALT: 36 U/L (ref 0–44)
AST: 61 U/L — ABNORMAL HIGH (ref 15–41)
Albumin: 2.4 g/dL — ABNORMAL LOW (ref 3.5–5.0)
Alkaline Phosphatase: 63 U/L (ref 38–126)
Anion gap: 14 (ref 5–15)
BUN: 26 mg/dL — ABNORMAL HIGH (ref 8–23)
CO2: 22 mmol/L (ref 22–32)
Calcium: 8.4 mg/dL — ABNORMAL LOW (ref 8.9–10.3)
Chloride: 110 mmol/L (ref 98–111)
Creatinine, Ser: 1.37 mg/dL — ABNORMAL HIGH (ref 0.44–1.00)
GFR, Estimated: 37 mL/min — ABNORMAL LOW (ref >60)
Glucose, Bld: 121 mg/dL — ABNORMAL HIGH (ref 70–99)
Potassium: 3.9 mmol/L (ref 3.5–5.1)
Sodium: 146 mmol/L — ABNORMAL HIGH (ref 135–145)
Total Bilirubin: 0.6 mg/dL (ref 0.3–1.2)
Total Protein: 5.7 g/dL — ABNORMAL LOW (ref 6.5–8.1)

## 2021-03-31 LAB — I-STAT CHEM 8, ED
BUN: 26 mg/dL — ABNORMAL HIGH (ref 8–23)
Calcium, Ion: 1.12 mmol/L — ABNORMAL LOW (ref 1.15–1.40)
Chloride: 112 mmol/L — ABNORMAL HIGH (ref 98–111)
Creatinine, Ser: 1.3 mg/dL — ABNORMAL HIGH (ref 0.44–1.00)
Glucose, Bld: 116 mg/dL — ABNORMAL HIGH (ref 70–99)
HCT: 25 % — ABNORMAL LOW (ref 36.0–46.0)
Hemoglobin: 8.5 g/dL — ABNORMAL LOW (ref 12.0–15.0)
Potassium: 3.7 mmol/L (ref 3.5–5.1)
Sodium: 145 mmol/L (ref 135–145)
TCO2: 23 mmol/L (ref 22–32)

## 2021-03-31 LAB — URINALYSIS, ROUTINE W REFLEX MICROSCOPIC
Bacteria, UA: NONE SEEN
Bilirubin Urine: NEGATIVE
Glucose, UA: NEGATIVE mg/dL
Ketones, ur: 5 mg/dL — AB
Nitrite: NEGATIVE
Protein, ur: NEGATIVE mg/dL
Specific Gravity, Urine: 1.026 (ref 1.005–1.030)
pH: 5 (ref 5.0–8.0)

## 2021-03-31 LAB — PROTIME-INR
INR: 1.4 — ABNORMAL HIGH (ref 0.8–1.2)
Prothrombin Time: 17.4 seconds — ABNORMAL HIGH (ref 11.4–15.2)

## 2021-03-31 LAB — SAMPLE TO BLOOD BANK

## 2021-03-31 LAB — TSH: TSH: 0.53 u[IU]/mL (ref 0.350–4.500)

## 2021-03-31 LAB — RESP PANEL BY RT-PCR (FLU A&B, COVID) ARPGX2
Influenza A by PCR: NEGATIVE
Influenza B by PCR: NEGATIVE
SARS Coronavirus 2 by RT PCR: NEGATIVE

## 2021-03-31 LAB — LACTIC ACID, PLASMA: Lactic Acid, Venous: 2.7 mmol/L (ref 0.5–1.9)

## 2021-03-31 LAB — BRAIN NATRIURETIC PEPTIDE: B Natriuretic Peptide: 157.4 pg/mL — ABNORMAL HIGH (ref 0.0–100.0)

## 2021-03-31 MED ORDER — ALBUTEROL SULFATE (2.5 MG/3ML) 0.083% IN NEBU: 2.5000 mg | INHALATION_SOLUTION | Freq: Four times a day (QID) | RESPIRATORY_TRACT | Status: DC | PRN

## 2021-03-31 MED ORDER — IOHEXOL 300 MG/ML  SOLN
100.0000 mL | Freq: Once | INTRAMUSCULAR | Status: AC | PRN
  Administered 2021-03-31: 100 mL via INTRAVENOUS

## 2021-03-31 MED ORDER — SODIUM CHLORIDE 0.9 % IV SOLN
2.0000 g | Freq: Once | INTRAVENOUS | Status: DC
  Filled 2021-03-31 (×2): qty 2

## 2021-03-31 MED ORDER — LACTATED RINGERS IV BOLUS
500.0000 mL | Freq: Once | INTRAVENOUS | Status: AC
  Administered 2021-03-31: 500 mL via INTRAVENOUS

## 2021-03-31 MED ORDER — VANCOMYCIN HCL 1250 MG/250ML IV SOLN
1250.0000 mg | Freq: Once | INTRAVENOUS | Status: AC
  Administered 2021-04-01: 1250 mg via INTRAVENOUS
  Filled 2021-03-31 (×2): qty 250

## 2021-03-31 MED ORDER — SODIUM CHLORIDE 0.9 % IV SOLN: 2.0000 g | INTRAVENOUS | Status: DC

## 2021-03-31 MED ORDER — MIRTAZAPINE 7.5 MG PO TABS
7.5000 mg | ORAL_TABLET | Freq: Every day | ORAL | Status: DC
  Administered 2021-03-31 – 2021-04-02 (×3): 7.5 mg via ORAL
  Filled 2021-03-31 (×4): qty 1

## 2021-03-31 MED ORDER — ACETAMINOPHEN 650 MG RE SUPP: 650.0000 mg | Freq: Four times a day (QID) | RECTAL | Status: DC | PRN

## 2021-03-31 MED ORDER — VANCOMYCIN HCL IN DEXTROSE 1-5 GM/200ML-% IV SOLN: 1000.0000 mg | INTRAVENOUS | Status: DC

## 2021-03-31 MED ORDER — ALBUTEROL SULFATE (2.5 MG/3ML) 0.083% IN NEBU: 2.5000 mg | INHALATION_SOLUTION | RESPIRATORY_TRACT | Status: DC | PRN

## 2021-03-31 MED ORDER — FUROSEMIDE 10 MG/ML IJ SOLN
20.0000 mg | Freq: Once | INTRAMUSCULAR | Status: AC
  Administered 2021-03-31: 20 mg via INTRAVENOUS
  Filled 2021-03-31 (×2): qty 4

## 2021-03-31 MED ORDER — POLYETHYLENE GLYCOL 3350 17 G PO PACK: 17.0000 g | PACK | Freq: Every day | ORAL | Status: DC | PRN

## 2021-03-31 MED ORDER — MEMANTINE HCL 10 MG PO TABS
10.0000 mg | ORAL_TABLET | Freq: Two times a day (BID) | ORAL | Status: DC
  Administered 2021-03-31 – 2021-04-03 (×6): 10 mg via ORAL
  Filled 2021-03-31 (×7): qty 1

## 2021-03-31 MED ORDER — DOCUSATE SODIUM 100 MG PO CAPS
100.0000 mg | ORAL_CAPSULE | Freq: Two times a day (BID) | ORAL | Status: DC
  Administered 2021-03-31 – 2021-04-03 (×5): 100 mg via ORAL
  Filled 2021-03-31 (×5): qty 1

## 2021-03-31 MED ORDER — ONDANSETRON HCL 4 MG PO TABS: 4.0000 mg | ORAL_TABLET | Freq: Four times a day (QID) | ORAL | Status: DC | PRN

## 2021-03-31 MED ORDER — VANCOMYCIN HCL IN DEXTROSE 1-5 GM/200ML-% IV SOLN: 1000.0000 mg | Freq: Once | INTRAVENOUS | Status: DC

## 2021-03-31 MED ORDER — HYDRALAZINE HCL 20 MG/ML IJ SOLN
10.0000 mg | INTRAMUSCULAR | Status: DC | PRN
Start: 2021-03-31 — End: 2021-04-03

## 2021-03-31 MED ORDER — TRAMADOL HCL 50 MG PO TABS
50.0000 mg | ORAL_TABLET | Freq: Four times a day (QID) | ORAL | Status: DC | PRN
Start: 2021-03-31 — End: 2021-04-03
  Administered 2021-04-01 – 2021-04-03 (×6): 50 mg via ORAL
  Filled 2021-03-31 (×6): qty 1

## 2021-03-31 MED ORDER — DONEPEZIL HCL 10 MG PO TABS
10.0000 mg | ORAL_TABLET | Freq: Every day | ORAL | Status: DC
  Administered 2021-03-31 – 2021-04-02 (×3): 10 mg via ORAL
  Filled 2021-03-31 (×4): qty 1

## 2021-03-31 MED ORDER — FENTANYL CITRATE PF 50 MCG/ML IJ SOSY
50.0000 ug | PREFILLED_SYRINGE | Freq: Once | INTRAMUSCULAR | Status: AC
  Administered 2021-03-31: 50 ug via INTRAVENOUS
  Filled 2021-03-31: qty 1

## 2021-03-31 MED ORDER — FERROUS SULFATE 325 (65 FE) MG PO TABS
325.0000 mg | ORAL_TABLET | Freq: Every day | ORAL | Status: DC
  Administered 2021-04-01 – 2021-04-03 (×3): 325 mg via ORAL
  Filled 2021-03-31 (×3): qty 1

## 2021-03-31 MED ORDER — ACETAMINOPHEN 325 MG PO TABS
650.0000 mg | ORAL_TABLET | Freq: Four times a day (QID) | ORAL | Status: DC | PRN
  Administered 2021-04-02: 650 mg via ORAL
  Filled 2021-03-31: qty 2

## 2021-03-31 MED ORDER — AMLODIPINE BESYLATE 5 MG PO TABS
5.0000 mg | ORAL_TABLET | Freq: Every day | ORAL | Status: DC
  Administered 2021-03-31 – 2021-04-03 (×4): 5 mg via ORAL
  Filled 2021-03-31 (×5): qty 1

## 2021-03-31 MED ORDER — SODIUM CHLORIDE 0.9 % IV SOLN: 1.0000 g | INTRAVENOUS | Status: DC

## 2021-03-31 MED ORDER — SODIUM CHLORIDE 0.9% FLUSH
3.0000 mL | Freq: Two times a day (BID) | INTRAVENOUS | Status: DC
  Administered 2021-04-01 – 2021-04-03 (×5): 3 mL via INTRAVENOUS

## 2021-03-31 MED ORDER — ONDANSETRON HCL 4 MG/2ML IJ SOLN
4.0000 mg | Freq: Four times a day (QID) | INTRAMUSCULAR | Status: DC | PRN
Start: 2021-03-31 — End: 2021-04-03

## 2021-03-31 NOTE — Assessment & Plan Note (Addendum)
Upon admission into the emergency department patient was noted to be tachycardic and tachypneic meeting SIRS criteria.  Labs significant for WBC 19.5 and lactic acid of 2.7.  Urinalysis did not note significant signs of infection.  Imaging of the chest, abdomen pelvis did not give any clear source of infection.  Patient had just recently had surgery on her left hip. Procalcitonin 1.38.  Vancomycin was discontinued due to negative MRSA PCR.  Blood culture showed no growth during hospitalization.  No focal evidence of infection was found.  Patient received 3 days of IV cefepime while inpatient.  Now transitioning to hospice

## 2021-03-31 NOTE — Assessment & Plan Note (Addendum)
Patient was noted to have O2 saturations as low as 88% on room air.  Patient was placed on 2 liters of nasal cannula oxygen with improvement in O2 saturations.  On physical exam patient did have at least +1 pitting bilateral lower extremity edema.  CT scan noted concern for small left-sided pleural effusion. -Continuous pulse oximetry with nasal cannula oxygen to maintain O2 saturation greater than 92% -Incentive spirometry -Check BNP and TSH -Lasix 20 mg I xV1 dose.  Reassess and determine need of further IV diuresis -Determine need of formal echocardiogram and

## 2021-03-31 NOTE — ED Notes (Signed)
Critical Lab value Lactic Acid 2.7 called by lab, reported to Dr. Barbee Cough  ?

## 2021-03-31 NOTE — ED Notes (Signed)
Pt back from CT

## 2021-03-31 NOTE — ED Notes (Signed)
Pt transported to CT at this time.

## 2021-03-31 NOTE — Progress Notes (Signed)
Transition of Care (TOC) - CAGE-AID Screening ? ? ?Patient Details  ?Name: Julie Martinez ?MRN: [id number] ?Date of Birth: 07/08/1931 ? ?Transition of Care (TOC) CM/SW Contact:    ?Clovis Cao, RN ?Phone Number: ?03/31/2021, 6:13 PM ? ? ?Clinical Narrative: ?Pt unable to participate due to dementia. ? ? ?CAGE-AID Screening: ?Substance Abuse Screening unable to be completed due to: : Patient unable to participate ? ?  ?  ?  ?  ?  ? ?  ? ?  ? ? ? ? ? ? ?

## 2021-03-31 NOTE — Assessment & Plan Note (Addendum)
Patient presented after having a fall out of bed at skilled nursing facility.  Found to have possible subarachnoid hemorrhage within the posterior quadrigeminal cistern.  Repeat imaging showed small subdural collection in the right cerebral convexity.  Neurosurgery had already been consulted and recommended holding Eliquis and repeat CT scan of the brain in a.m. -Admit to a progressive bed -Neurochecks  -Bed alarm on -Hold Eliquis -Recheck CT scan of the brain in a.m. -Appreciate neurosurgery consultative services, we will follow-up for any further recommendations

## 2021-03-31 NOTE — ED Provider Notes (Signed)
?[facility] ?Provider Note ? ? ?CSN: [id number] ?Arrival date & time: 03/31/21  2500 ? ?  ? ?History ? ?No chief complaint on file. ? ? ?Julie Martinez is a 88 y.o. female. ? ?88 year old female is currently at rehab secondary to a broken left hip the presents emerged from today after another fall.  Patient is on Eliquis.  Has a hematoma to her left forehead so brought here as an activated level 2 trauma.  She also has a reported skin tear to her left forearm.  Has some hip pain but seems mostly in the left.  No other obvious changes.  Patient is DNR, form at bedside. ? ? ? ?  ? ?Home Medications ?Prior to Admission medications   ?Medication Sig Start Date End Date Taking? Authorizing Provider  ?Acetaminophen (TYLENOL ARTHRITIS PAIN PO) Take 650 tablets by mouth every 8 (eight) hours as needed (pain).    [provider]  ?amLODipine (NORVASC) 5 MG tablet Take 1 tablet (5 mg total) by mouth daily. 03/27/21   Shelly Coss, MD  ?apixaban (ELIQUIS) 2.5 MG TABS tablet Take 2.5 mg by mouth 2 (two) times daily. 05/02/19   [provider]  ?cholecalciferol (VITAMIN D) 25 MCG (1000 UNIT) tablet Take 1,000 Units by mouth daily. 01/16/21   [provider]  ?donepezil (ARICEPT) 10 MG tablet Take 1 tablet (10 mg total) by mouth at bedtime. 01/29/21   Lomax, Amy, NP  ?ferrous sulfate 325 (65 FE) MG tablet Take 1 tablet (325 mg total) by mouth daily with breakfast. 03/27/21   Shelly Coss, MD  ?HYDROcodone-acetaminophen (NORCO/VICODIN) 5-325 MG tablet Take 1 tablet by mouth every 4 (four) hours as needed for moderate pain (pain score 4-6). 03/27/21   Shelly Coss, MD  ?memantine (NAMENDA) 10 MG tablet Take 1 tablet (10 mg total) by mouth 2 (two) times daily. 01/29/21   Lomax, Amy, NP  ?mirtazapine (REMERON) 7.5 MG tablet Take 1 tablet (7.5 mg total) by mouth at bedtime. 01/29/21   Lomax, Amy, NP  ?polyethylene glycol (MIRALAX / GLYCOLAX) 17 g packet Take 17 g by  mouth daily as needed for mild constipation. 03/27/21   Shelly Coss, MD  ?vitamin B-12 (CYANOCOBALAMIN) 1000 MCG tablet Take 1,000 mcg by mouth daily.    [provider]  ?   ? ?Allergies    ?Patient has no known allergies.   ? ?Review of Systems   ?Review of Systems ? ?Physical Exam ?Updated Vital Signs ?SpO2 96%  ?Physical Exam ?Vitals and nursing note reviewed.  ?Constitutional:   ?   Appearance: She is well-developed.  ?HENT:  ?   Head: Normocephalic.  ?   Comments: Hematoma to left forehead ?   Mouth/Throat:  ?   Mouth: Mucous membranes are moist.  ?   Pharynx: Oropharynx is clear.  ?Eyes:  ?   Pupils: Pupils are equal, round, and reactive to light.  ?Cardiovascular:  ?   Rate and Rhythm: Normal rate and regular rhythm.  ?Pulmonary:  ?   Effort: No respiratory distress.  ?   Breath sounds: No stridor.  ?Abdominal:  ?   General: Abdomen is flat. There is no distension.  ?Musculoskeletal:     ?   General: Tenderness (hip pain on both sides, worse on left) present.  ?   Cervical back: Normal range of motion.  ?Skin: ?   General: Skin is warm and dry.  ?Neurological:  ?   General: No focal deficit present.  ?  Mental Status: She is alert.  ?   Cranial Nerves: No cranial nerve deficit.  ?   Sensory: No sensory deficit.  ? ? ?ED Results / Procedures / Treatments   ?Labs ?(all labs ordered are listed, but only abnormal results are displayed) ?Labs Reviewed  ?RESP PANEL BY RT-PCR (FLU A&B, COVID) ARPGX2  ?COMPREHENSIVE METABOLIC PANEL  ?CBC  ?ETHANOL  ?URINALYSIS, ROUTINE W REFLEX MICROSCOPIC  ?LACTIC ACID, PLASMA  ?PROTIME-INR  ?I-STAT CHEM 8, ED  ?SAMPLE TO BLOOD BANK  ? ? ?EKG ?None ? ?Radiology ?No results found. ? ?Procedures ?Procedures  ? ? ?Medications Ordered in ED ?Medications  ?fentaNYL (SUBLIMAZE) injection 50 mcg (has no administration in time range)  ? ? ?ED Course/ Medical Decision Making/ A&P ?  ?                        ?Medical Decision Making ?Amount and/or Complexity of Data  Reviewed ?Labs: ordered. ?Radiology: ordered. ?ECG/medicine tests: ordered. ? ?Risk ?Prescription drug management. ? ? ?Level II trauma. Will check labs, image affected body parts.  ? ?Care transferred pending almost all of workup.  ? ?Final Clinical Impression(s) / ED Diagnoses ?Final diagnoses:  ?Trauma  ? ? ?Rx / DC Orders ?ED Discharge Orders   ? ? None  ? ?  ? ? ?  ?Merrily Pew, MD ?03/31/21 1561 ? ?

## 2021-03-31 NOTE — ED Notes (Signed)
Radiology at bedside

## 2021-03-31 NOTE — Progress Notes (Signed)
Chaplain responded to this level II fall on thinners.  Patient has been staying at [facility] rehab center.  Chaplain went bedside and connected with the patient who was not sure about family coming.  Chaplain offered extra blankets and provided comfort for the patient as well as empathetic listening.  Patient heading to CT.  Chaplain available as needed for patient/family support. ?[place], [place]. ? ? ? 03/31/21 0718  ?Clinical Encounter Type  ?Visited With Patient;Health care provider  ?Visit Type Trauma;Initial;ED  ?Referral From Nurse  ?Consult/Referral To Chaplain  ? ? ?

## 2021-03-31 NOTE — H&P (Signed)
History and Physical    Patient: Julie Martinez YKD:[id number] DOB: 1931-09-10 DOA: 03/31/2021 DOS: the patient was seen and examined on 03/31/2021 PCP: Cari Caraway, MD  Patient coming from: [facility] via EMS  Chief Complaint:  Chief Complaint  Patient presents with   Level 2 Fall on Thinners   HPI: Julie Martinez is a 88 y.o. female with medical history significant of hypertension, CKD stage IIIb, advanced dementia, DVT/PE on Eliquis, presents after having a fall out of bed.  History is mostly obtained from review of records as the patient has history of dementia.  Patient had she had just recently been hospitalized from 3/3-3/9 after having a fall at home found to have a closed left hip fracture s/p left hip hemiarthroplasty on 3/5.  Eliquis had been temporarily held for the surgery, and was restarted following the procedure.  Patient was found by staff after having a fall out of bed this morning with hematoma to the left forehead, skin tear noted to left forearm, and complaints of left hip pain.  Upon admission into the emergency department patient was noted to be afebrile, pulse 90-115, respiration 22-32, blood pressures elevated up to 170/69, and O2 saturations noted as low as 88% on room air with improvement on 2 L of nasal cannula oxygen. Labs significant for WBC 19.5, hemoglobin 8.9, sodium 146, BUN 26, creatinine 1.37.  CT scan of the head noted minimal focal subarachnoid hemorrhage within the posterior quadrigeminal cistern.  CT scan of the chest abdomen and pelvis noted acute on chronic fracture of the right superior pubic rami, unchanged compression fractures of T8 along with L4, age-indeterminate L3 compression fracture, chronic appearing bilateral rib fractures, small left-sided pleural effusion, gallstone, right inguinal hernia containing fat with small pleural fluid, and coronary calcifications.  Neurosurgery have been consulted and recommended repeating CT scan of the head  in 6 hours and possible discharge home.  Urinalysis noted trace leukocytes with moderate hemoglobin, ketones, and no significant bacteria seen.  TRH was called to admit, but initially deferred admission until repeat CT scan obtained.  Repeat CT scan showed small subdural collection of the right cerebral convexity.  Neurosurgery evaluated and recommended holding Eliquis and rechecking repeat CT scan of the brain in a.m.  Review of Systems: unable to review all systems due to the inability of the patient to answer questions. Past Medical History:  Diagnosis Date   Anxiety    Chronic kidney disease    stage 3   Dementia (HCC)    Dyspnea    with exertion, no oxygen   High cholesterol    Hypertension    Osteopenia    Walker as ambulation aid    Past Surgical History:  Procedure Laterality Date   ABDOMINAL HYSTERECTOMY     COLONOSCOPY     EYE SURGERY Bilateral    eyelids   HIP ARTHROPLASTY Left 03/23/2021   Procedure: ARTHROPLASTY BIPOLAR HIP (HEMIARTHROPLASTY);  Surgeon: Meredith Pel, MD;  Location: [facility];  Service: Orthopedics;  Laterality: Left;   SHOULDER SURGERY  2017   plating for fracture left   Social History:  reports that she has never smoked. She has never used smokeless tobacco. She reports that she does not drink alcohol and does not use drugs.  No Known Allergies  Family History  Problem Relation Age of Onset   Heart failure Mother     Prior to Admission medications   Medication Sig Start Date End Date Taking? Authorizing Provider  Acetaminophen (  TYLENOL ARTHRITIS PAIN PO) Take 650 tablets by mouth every 8 (eight) hours as needed (pain).   Yes [provider]  amLODipine (NORVASC) 5 MG tablet Take 1 tablet (5 mg total) by mouth daily. 03/27/21  Yes Shelly Coss, MD  apixaban (ELIQUIS) 2.5 MG TABS tablet Take 2.5 mg by mouth 2 (two) times daily. 05/02/19  Yes [provider]  cholecalciferol (VITAMIN D) 25 MCG (1000 UNIT) tablet Take 1,000 Units  by mouth daily. 01/16/21  Yes [provider]  docusate sodium (COLACE) 100 MG capsule Take 100 mg by mouth 2 (two) times daily.   Yes [provider]  donepezil (ARICEPT) 10 MG tablet Take 1 tablet (10 mg total) by mouth at bedtime. 01/29/21  Yes Lomax, Amy, NP  ferrous sulfate 325 (65 FE) MG tablet Take 1 tablet (325 mg total) by mouth daily with breakfast. 03/27/21  Yes Adhikari, Amrit, MD  memantine (NAMENDA) 10 MG tablet Take 1 tablet (10 mg total) by mouth 2 (two) times daily. 01/29/21  Yes Lomax, Amy, NP  mirtazapine (REMERON) 7.5 MG tablet Take 1 tablet (7.5 mg total) by mouth at bedtime. 01/29/21  Yes Lomax, Amy, NP  polyethylene glycol (MIRALAX / GLYCOLAX) 17 g packet Take 17 g by mouth daily as needed for mild constipation. 03/27/21  Yes Shelly Coss, MD  vitamin B-12 (CYANOCOBALAMIN) 1000 MCG tablet Take 1,000 mcg by mouth daily.   Yes [provider]  HYDROcodone-acetaminophen (NORCO/VICODIN) 5-325 MG tablet Take 1 tablet by mouth every 4 (four) hours as needed for moderate pain (pain score 4-6). 03/27/21   Shelly Coss, MD    Physical Exam: Vitals:   03/31/21 1000 03/31/21 1015 03/31/21 1030 03/31/21 1045  BP: (!) 150/55 (!) 149/64 (!) 153/58 (!) 158/53  Pulse: 93 94 95 96  Resp: (!) 28 (!) 28 (!) 26 (!) 32  Temp:      TempSrc:      SpO2: 96% 96% 96% 96%  Weight:      Height:       Constitutional: Frail elderly female who appears to be in no acute distress at this time Eyes: Lower eyelid lag.  Pupils equal reactive to light ENMT: Mucous membranes are dry.  Posterior pharynx clear of any exudate or lesions. Neck: normal, supple. Respiratory: Tachypneic with crackles noted of the left lower lung field.  No significant wheezes or rhonchi.  O2 saturations currently maintained on 2 L of nasal cannula oxygen. Cardiovascular: Regular rate and rhythm, no murmurs / rubs / gallops. No extremity edema. 2+ pedal pulses. No carotid bruits.  Abdomen: no  tenderness, no masses palpated. No hepatosplenomegaly. Bowel sounds positive.  Musculoskeletal: no clubbing / cyanosis. No joint deformity upper and lower extremities. Good ROM, no contractures. Normal muscle tone.  Skin: no rashes, lesions, ulcers. No induration Neurologic: CN 2-12 grossly intact. Sensation intact, DTR normal. Strength 5/5 in all 4.  Psychiatric: Normal judgment and insight. Alert and oriented x 3. Normal mood.   Data Reviewed:  Reviewed all labs, imaging, and diagnostic test as noted above. EKG reveals sinus rhythm at 90 bpm with premature atrial complex. Assessment and Plan: Intracranial hemorrhage ([place]) secondary to fall Patient presented after having a fall out of bed at skilled nursing facility.  Found to have possible subarachnoid hemorrhage within the posterior quadrigeminal cistern.  Repeat imaging showed small subdural collection measuring 6 mm in the right cerebral convexity.  Neurosurgery had already been consulted and recommended holding Eliquis and repeat CT scan of the  brain in a.m. -Admit to a progressive bed -Neurochecks  -Bed alarm on -Hold Eliquis -Recheck CT scan of the brain in a.m. -Appreciate neurosurgery consultative services, we will follow-up for any further recommendations  Acute respiratory failure with hypoxia ([place]) Patient was noted to have O2 saturations as low as 88% on room air.  Patient was placed on 2 liters of nasal cannula oxygen with improvement in O2 saturations.  On physical exam patient did have at least +1 pitting bilateral lower extremity edema.  CT scan noted concern for small left-sided pleural effusion.  This was not a PE study, but patient did have negative Doppler ultrasound of the lower extremities performed on 3/4 and had been on Eliquis. -Continuous pulse oximetry with nasal cannula oxygen to maintain O2 saturation greater than 92% -Incentive spirometry -Check BNP and TSH -Lasix 20 mg I xV1 dose.  Reassess and determine need  of further IV diuresis -Determine need of formal echocardiogram and  SIRS (systemic inflammatory response syndrome) (HCC) Acute.  Upon admission into the emergency department patient was noted to be tachycardic and tachypneic meeting SIRS criteria.  Labs significant for WBC 19.5 and lactic acid of 2.7.  Urinalysis did not note significant signs of infection.  Imaging of the chest, abdomen pelvis did not give any clear source of infection.  Patient had just recently had surgery on her left hip.  MRSA screening at that time was positive.   -Check blood cultures -Give antibiotics of vancomycin and cefepime.  De-escalate if no clear signs of infection appreciated. 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 -Hold Eliquis  Closed fracture of superior pubic ramus (HCC) Acute on chronic.  Patient was noted to have  superior ramus fracture on the right.  Orthopedics recommended weightbearing as tolerated. -Tramadol as needed for pain -PT to evaluate and treat  Essential hypertension On admission blood pressure was elevated up to 170/69. Home medication regimen includes amlodipine 5 mg daily. -Continue amlodipine 5 mg daily  Dementia (HCC) Medication regimen includes donepezil 10 mg nightly and Namenda 10 mg twice daily -Delirium precautions -Continue current regimen  CKD stage IIIa Chronic.  Creatinine 1.37 with BUN 26.  Which appears around patient's baseline.  Closed left hip fracture status post left hip hemiarthroplasty Patient just recently underwent left hip hemiarthroplasty on 3/5.  Patient was on hydrocodone, but this was discontinued due to abdominal distention.  Incidental findings include compression fractures of L3, chronic appearing bilateral rib fractures, small left-sided pleural effusion, gallstone, right inguinal hernia containing fat with small pleural  fluid, and coronary calcifications.  Advance Care Planning:   Code Status: DNR   Consults: Neurosurgery  Family Communication: Daughter updated at bedside  Severity of Illness: The appropriate patient status for this patient is OBSERVATION. Observation status is judged to be reasonable and necessary in order to provide the required intensity of service to ensure the patient's safety. The patient's presenting symptoms, physical exam findings, and initial radiographic and laboratory data in the context of their medical condition is felt to place them at decreased risk for further clinical deterioration. Furthermore, it is anticipated that the patient will be medically stable for discharge from the hospital within 2 midnights of admission.   Author: Norval Morton, MD 03/31/2021 10:58 AM  For on call review [email or web address].

## 2021-03-31 NOTE — Assessment & Plan Note (Addendum)
Patient with prior history of DVT and PE in 01/2019 currently on Eliquis.  Repeat vascular duplex ultrasound lower extremities March 2023 with no DVT noted.  Eliquis now discontinued as patient will be transitioning to hospice on discharge.

## 2021-03-31 NOTE — Progress Notes (Signed)
Family has left bedside unable to complete admission questions will complete tomorrow if available. ?

## 2021-03-31 NOTE — Progress Notes (Signed)
Orthopedic Tech Progress Note ?Patient Details:  ?LOGAN VEGH ?11/10/1931 ?[id number] ? ?Patient ID: Osie Bond, female   DOB: May 20, 1931, 88 y.o.   MRN: [id number] ?I attended trauma page. ?Karolee Stamps ?03/31/2021, 7:01 AM ? ?

## 2021-03-31 NOTE — Assessment & Plan Note (Signed)
Acute on chronic.  Patient was noted to have  superior ramus fracture on the right.  Orthopedics recommended weightbearing as tolerated. -Tramadol as needed for pain

## 2021-03-31 NOTE — Assessment & Plan Note (Signed)
On admission blood pressure was elevated up to 170/69. Home medication regimen includes amlodipine 5 mg daily. -Continue amlodipine 5 mg daily

## 2021-03-31 NOTE — Assessment & Plan Note (Addendum)
Medication regimen includes donepezil 10 mg nightly and Namenda 10 mg twice daily -Delirium precautions -Continue current regimen

## 2021-03-31 NOTE — Progress Notes (Signed)
Pt oriented to unit. Pt resting with the bed locked in the lowest position. Pt's call bell is within reach. Family at bedside.  ?

## 2021-03-31 NOTE — Consult Note (Cosign Needed)
Providing Compassionate, Quality Care - Together   Reason for Consult: Head trauma Referring Physician: Dr. [name] is an 88 y.o. female.  HPI: Julie Martinez is an 88 year old female with a history of chronic kidney disease stage 3, dementia, high cholesterol, hypertension, osteopenia, s/p left hip arthroplasty on 03/23/2021, DVT (on Eliquis). She resides at [facility] where she suffered a fall this morning. She struck her head on the left. She was brought in to the [facility] Emergency Department this morning. CT scan revealed minimal focal subarachnoid hemorrhage within the posterior quadrigeminal cistern. A repeat CT head was performed at the 6 hour mark revealing a small subdural collection in the right cerebral convexity. She also sustained an acute fracture deformity involving the right superior pubic rami. She has multiple chronic thoracic compression fractures as well as chronic degenerative changes of her cervical spine. She is being admitted by the Hospitalist service for further observation.  Past Medical History:  Diagnosis Date   Anxiety    Chronic kidney disease    stage 3   Dementia (HCC)    Dyspnea    with exertion, no oxygen   High cholesterol    Hypertension    Osteopenia    Walker as ambulation aid     Past Surgical History:  Procedure Laterality Date   ABDOMINAL HYSTERECTOMY     COLONOSCOPY     EYE SURGERY Bilateral    eyelids   HIP ARTHROPLASTY Left 03/23/2021   Procedure: ARTHROPLASTY BIPOLAR HIP (HEMIARTHROPLASTY);  Surgeon: Meredith Pel, MD;  Location: [facility];  Service: Orthopedics;  Laterality: Left;   SHOULDER SURGERY  2017   plating for fracture left    Family History  Problem Relation Age of Onset   Heart failure Mother     Social History:  reports that she has never smoked. She has never used smokeless tobacco. She reports that she does not drink alcohol and does not use drugs.  Allergies: No Known  Allergies  Medications: I have reviewed the patient's current medications.  Results for orders placed or performed during the hospital encounter of 03/31/21 (from the past 48 hour(s))  Resp Panel by RT-PCR (Flu A&B, Covid) Nasopharyngeal Swab     Status: None   Collection Time: 03/31/21  7:01 AM   Specimen: Nasopharyngeal Swab; Nasopharyngeal(NP) swabs in vial transport medium  Result Value Ref Range   SARS Coronavirus 2 by RT PCR NEGATIVE NEGATIVE    Comment: (NOTE) SARS-CoV-2 target nucleic acids are NOT DETECTED.  The SARS-CoV-2 RNA is generally detectable in upper respiratory specimens during the acute phase of infection. The lowest concentration of SARS-CoV-2 viral copies this assay can detect is 138 copies/mL. A negative result does not preclude SARS-Cov-2 infection and should not be used as the sole basis for treatment or other patient management decisions. A negative result may occur with  improper specimen collection/handling, submission of specimen other than nasopharyngeal swab, presence of viral mutation(s) within the areas targeted by this assay, and inadequate number of viral copies(<138 copies/mL). A negative result must be combined with clinical observations, patient history, and epidemiological information. The expected result is Negative.  Fact Sheet for Patients:  EntrepreneurPulse.com.au  Fact Sheet for Healthcare Providers:  IncredibleEmployment.be  This test is no t yet approved or cleared by the [place] FDA and  has been authorized for detection and/or diagnosis of SARS-CoV-2 by FDA under an Emergency Use Authorization (EUA). This EUA will remain  in  effect (meaning this test can be used) for the duration of the COVID-19 declaration under Section 564(b)(1) of the Act, 21 U.S.C.section 360bbb-3(b)(1), unless the authorization is terminated  or revoked sooner.       Influenza A by PCR NEGATIVE NEGATIVE    Influenza B by PCR NEGATIVE NEGATIVE    Comment: (NOTE) The Xpert Xpress SARS-CoV-2/FLU/RSV plus assay is intended as an aid in the diagnosis of influenza from Nasopharyngeal swab specimens and should not be used as a sole basis for treatment. Nasal washings and aspirates are unacceptable for Xpert Xpress SARS-CoV-2/FLU/RSV testing.  Fact Sheet for Patients: EntrepreneurPulse.com.au  Fact Sheet for Healthcare Providers: IncredibleEmployment.be  This test is not yet approved or cleared by the [place] FDA and has been authorized for detection and/or diagnosis of SARS-CoV-2 by FDA under an Emergency Use Authorization (EUA). This EUA will remain in effect (meaning this test can be used) for the duration of the COVID-19 declaration under Section 564(b)(1) of the Act, 21 U.S.C. section 360bbb-3(b)(1), unless the authorization is terminated or revoked.  Performed at [facility], [place] [address] [place], [place] [address]   Comprehensive metabolic panel     Status: Abnormal   Collection Time: 03/31/21  7:03 AM  Result Value Ref Range   Sodium 146 (H) 135 - 145 mmol/L   Potassium 3.9 3.5 - 5.1 mmol/L   Chloride 110 98 - 111 mmol/L   CO2 22 22 - 32 mmol/L   Glucose, Bld 121 (H) 70 - 99 mg/dL    Comment: Glucose reference range applies only to samples taken after fasting for at least 8 hours.   BUN 26 (H) 8 - 23 mg/dL   Creatinine, Ser 1.37 (H) 0.44 - 1.00 mg/dL   Calcium 8.4 (L) 8.9 - 10.3 mg/dL   Total Protein 5.7 (L) 6.5 - 8.1 g/dL   Albumin 2.4 (L) 3.5 - 5.0 g/dL   AST 61 (H) 15 - 41 U/L   ALT 36 0 - 44 U/L   Alkaline Phosphatase 63 38 - 126 U/L   Total Bilirubin 0.6 0.3 - 1.2 mg/dL   GFR, Estimated 37 (L) >60 mL/min    Comment: (NOTE) Calculated using the CKD-EPI Creatinine Equation (2021)    Anion gap 14 5 - 15    Comment: Performed at [facility], [place] [address], [place], [place] [address]  CBC     Status: Abnormal    Collection Time: 03/31/21  7:03 AM  Result Value Ref Range   WBC 19.5 (H) 4.0 - 10.5 K/uL   RBC 2.70 (L) 3.87 - 5.11 MIL/uL   Hemoglobin 8.9 (L) 12.0 - 15.0 g/dL   HCT 28.6 (L) 36.0 - 46.0 %   MCV 105.9 (H) 80.0 - 100.0 fL   MCH 33.0 26.0 - 34.0 pg   MCHC 31.1 30.0 - 36.0 g/dL   RDW 13.5 11.5 - 15.5 %   Platelets 424 (H) 150 - 400 K/uL   nRBC 0.3 (H) 0.0 - 0.2 %    Comment: Performed at [facility] [address], [place], [place] [address]  Lactic acid, plasma     Status: Abnormal   Collection Time: 03/31/21  7:03 AM  Result Value Ref Range   Lactic Acid, Venous 2.7 (HH) 0.5 - 1.9 mmol/L    Comment: CRITICAL RESULT CALLED TO, READ BACK BY AND VERIFIED WITH: JACKIE DODD RN.'@[id number]'$  ON 3.13.23 BY TCALDWELL MT. Performed at [facility], [place]  [address], [place], [place] [address]   Protime-INR     Status: Abnormal   Collection Time: 03/31/21  7:03 AM  Result Value Ref Range   Prothrombin Time 17.4 (H) 11.4 - 15.2 seconds   INR 1.4 (H) 0.8 - 1.2    Comment: (NOTE) INR goal varies based on device and disease states. Performed at [facility], [place] [address] [place], [place] [address]   Sample to Blood Bank     Status: None   Collection Time: 03/31/21  7:03 AM  Result Value Ref Range   Blood Bank Specimen SAMPLE AVAILABLE FOR TESTING    Sample Expiration      04/01/2021,2359 Performed at [facility], [place] [address] [place], [place] [address]   I-Stat Chem 8, ED     Status: Abnormal   Collection Time: 03/31/21  7:21 AM  Result Value Ref Range   Sodium 145 135 - 145 mmol/L   Potassium 3.7 3.5 - 5.1 mmol/L   Chloride 112 (H) 98 - 111 mmol/L   BUN 26 (H) 8 - 23 mg/dL   Creatinine, Ser 1.30 (H) 0.44 - 1.00 mg/dL   Glucose, Bld 116 (H) 70 - 99 mg/dL    Comment: Glucose reference range applies only to samples taken after fasting for at least 8 hours.   Calcium, Ion 1.12 (L) 1.15 - 1.40 mmol/L   TCO2 23 22 - 32 mmol/L   Hemoglobin 8.5 (L) 12.0 - 15.0  g/dL   HCT 25.0 (L) 36.0 - 46.0 %  Urinalysis, Routine w reflex microscopic Urine, In & Out Cath     Status: Abnormal   Collection Time: 03/31/21 11:20 AM  Result Value Ref Range   Color, Urine YELLOW YELLOW   APPearance HAZY (A) CLEAR   Specific Gravity, Urine 1.026 1.005 - 1.030   pH 5.0 5.0 - 8.0   Glucose, UA NEGATIVE NEGATIVE mg/dL   Hgb urine dipstick MODERATE (A) NEGATIVE   Bilirubin Urine NEGATIVE NEGATIVE   Ketones, ur 5 (A) NEGATIVE mg/dL   Protein, ur NEGATIVE NEGATIVE mg/dL   Nitrite NEGATIVE NEGATIVE   Leukocytes,Ua TRACE (A) NEGATIVE   RBC / HPF 6-10 0 - 5 RBC/hpf   WBC, UA 0-5 0 - 5 WBC/hpf   Bacteria, UA NONE SEEN NONE SEEN   Squamous Epithelial / LPF 0-5 0 - 5   Non Squamous Epithelial 0-5 (A) NONE SEEN    Comment: Performed at [facility], [address], [place],  [address]    CT HEAD WO CONTRAST  Result Date: 03/31/2021 CLINICAL DATA:  Head trauma, moderate-severe; fall EXAM: CT HEAD WITHOUT CONTRAST TECHNIQUE: Contiguous axial images were obtained from the base of the skull through the vertex without intravenous contrast. RADIATION DOSE REDUCTION: This exam was performed according to the departmental dose-optimization program which includes automated exposure control, adjustment of the mA and/or kV according to patient size and/or use of iterative reconstruction technique. COMPARISON:  03/21/2021 FINDINGS: Brain: There is a new small subcentimeter focus of hyperdensity within the right parasagittal aspect of the quadrigeminal plate cistern (series 3, image 13). No additional acute hemorrhage is identified. No new loss of gray-white differentiation. Stable findings of probable chronic microvascular ischemic changes in the cerebral white matter. Asymmetric low-density in the left pons is probably artifactual. Stable prominence of ventricles and sulci reflecting parenchymal volume loss. No extra-axial collection. Vascular: There is atherosclerotic  calcification at the skull base. Skull: Calvarium is unremarkable. Sinuses/Orbits: Refer to maxillofacial CT  Other: Left frontal scalp hematoma.  Mastoid air cells are clear. IMPRESSION: New minimal focal subarachnoid hemorrhage within the posterior quadrigeminal cistern. Otherwise stable intracranial appearance. These results were called by telephone at the time of interpretation on 03/31/2021 at 8:19 am to provider Dr. Billy Fischer, who verbally acknowledged these results. Electronically Signed   By: Macy Mis M.D.   On: 03/31/2021 08:19   CT CERVICAL SPINE WO CONTRAST  Result Date: 03/31/2021 CLINICAL DATA:  [place] from bed.  Trauma. EXAM: CT MAXILLOFACIAL WITHOUT CONTRAST CT CERVICAL SPINE WITHOUT CONTRAST TECHNIQUE: Multidetector CT imaging of the maxillofacial structures was performed. Multiplanar CT image reconstructions were also generated. A small metallic BB was placed on the right temple in order to reliably differentiate right from left. Multidetector CT imaging of the cervical spine was performed without intravenous contrast. Multiplanar CT image reconstructions were also generated. RADIATION DOSE REDUCTION: This exam was performed according to the departmental dose-optimization program which includes automated exposure control, adjustment of the mA and/or kV according to patient size and/or use of iterative reconstruction technique. COMPARISON:  Cervical spine CT 03/21/2021 FINDINGS: CT MAXILLOFACIAL FINDINGS Osseous: No fracture or mandibular dislocation. No destructive process. Orbits: Negative. No traumatic or inflammatory finding. Sinuses: Clear. Soft tissues: Scalp hematoma/contusion in the posterior left frontal region is incompletely visualized. Limited intracranial: See report for head CT performed at the same time. CT CERVICAL FINDINGS Alignment: Trace anterolisthesis of C4 on 5 is stable. Skull base and vertebrae: Stable compression deformity at C3 with stable degenerative endplate versus  chronic posttraumatic inferior endplate deformity at C6. No evidence for an acute cervical spine fracture. Soft tissues and spinal canal: No prevertebral fluid or swelling. No visible canal hematoma. Disc levels: Chronic substantial loss of disc height noted C5-6 in C6-7. Diffuse facet degeneration noted bilaterally. Upper chest: Unremarkable. Other: None. IMPRESSION: 1. No evidence of acute facial bone fracture. 2. Scalp hematoma/contusion in the posterior left frontal region has been incompletely visualized. 3. No evidence of an acute cervical spine fracture or traumatic subluxation. 4. Chronic degenerative changes in the cervical spine as above. Electronically Signed   By: Misty Stanley M.D.   On: 03/31/2021 08:16   DG Pelvis Portable  Result Date: 03/31/2021 CLINICAL DATA:  Trauma. EXAM: PORTABLE PELVIS 1-2 VIEWS COMPARISON:  03/21/2021. FINDINGS: Status post left total hip arthroplasty. Right hip appears located and intact. Acute fracture deformity is noted involving the right superior pubic rami. Remote fracture deformities involving bilateral inferior pubic rami identified. IMPRESSION: 1. Acute fracture deformity involving the right superior pubic rami. 2. Remote fracture deformities involving bilateral inferior pubic rami. 3. Status post left total hip arthroplasty. Electronically Signed   By: Kerby Moors M.D.   On: 03/31/2021 07:28   CT CHEST ABDOMEN PELVIS W CONTRAST  Result Date: 03/31/2021 CLINICAL DATA:  Poly trauma.  Status post fall EXAM: CT CHEST, ABDOMEN, AND PELVIS WITH CONTRAST TECHNIQUE: Multidetector CT imaging of the chest, abdomen and pelvis was performed following the standard protocol during bolus administration of intravenous contrast. RADIATION DOSE REDUCTION: This exam was performed according to the departmental dose-optimization program which includes automated exposure control, adjustment of the mA and/or kV according to patient size and/or use of iterative reconstruction  technique. CONTRAST:  151m OMNIPAQUE IOHEXOL 300 MG/ML  SOLN COMPARISON:  None. FINDINGS: CT CHEST FINDINGS Cardiovascular: Normal heart size. No pericardial effusion. Aortic atherosclerosis and coronary artery calcifications noted. Mediastinum/Nodes: No enlarged mediastinal, hilar, or axillary lymph nodes. Thyroid gland, trachea, and esophagus demonstrate no significant findings.  Lungs/Pleura: Small left pleural effusion. Mild pleural thickening is noted overlying the posterior right base. Bilateral lower lung zone predominant peripheral interstitial reticulation is identified. No signs of pulmonary contusion or pneumothorax. No airspace consolidation Musculoskeletal: No acute osseous findings identified at this time. Previous ORIF of the left humerus. Chronic compression deformity involving the T8 vertebral body is unchanged from 01/30/2018. Chronic appearing left lateral fourth, fifth, sixth rib fractures identified. Remote right posterior eleventh and twelfth rib fractures are identified. There also chronic appearing right ninth and tenth rib fractures. CT ABDOMEN PELVIS FINDINGS Hepatobiliary: No hepatic injury or perihepatic hematoma. Stone within the gallbladder measures 8 mm. No gallbladder wall thickening or inflammation. Pancreas: Unremarkable. No pancreatic ductal dilatation or surrounding inflammatory changes. Spleen: Normal in size without focal abnormality. Adrenals/Urinary Tract: Normal adrenal glands. No kidney mass, nephrolithiasis or hydronephrosis identified. No focal bladder abnormality identified. Stomach/Bowel: Moderate hiatal hernia no bowel wall thickening, inflammation, or distension. Normal caliber appendix Vascular/Lymphatic: Aortic atherosclerosis. No aneurysm. No abdominopelvic adenopathy identified. Reproductive: Status post hysterectomy. No adnexal masses. Other: There is a right inguinal hernia which contains fat as well as a small volume of fluid. Musculoskeletal: Status post left  hip arthroplasty. Bilateral chronic inferior pubic rami fractures. Acute on chronic fracture deformity involves the right superior pubic rami noted, image 78/5. Right hip appears located. There are compression fractures involving the L3 and L4 vertebral bodies with loss of approximately 50% of the vertebral body height at each level. The L4 fracture is unchanged from 06/17/16. The L3 vertebral body fracture is age indeterminate, new from 06/17/2016. IMPRESSION: 1. No acute posttraumatic solid organ injury identified within the chest, abdomen or pelvis. 2. Acute on chronic fracture deformity involves the right superior pubic rami. 3. Unchanged remote compression deformities involving T8 and L4 vertebral bodies. There is a compression fracture involving the L3 vertebral body which is age indeterminate but appears new from 06/17/2016. 4. Chronic appearing bilateral rib fractures. 5. Small left pleural effusion. 6. Gallstone. 7. Moderate hiatal hernia. 8. Right inguinal hernia which contains fat as well as a small volume of fluid. 9. Aortic Atherosclerosis (ICD10-I70.0). Coronary artery calcifications. Electronically Signed   By: Kerby Moors M.D.   On: 03/31/2021 08:21   DG Chest Port 1 View  Result Date: 03/31/2021 CLINICAL DATA:  Trauma. EXAM: PORTABLE CHEST 1 VIEW COMPARISON:  01/30/2018 FINDINGS: Stable cardiomediastinal contours. Small right pleural effusion. Diffuse pulmonary vascular congestion noted. Scar versus atelectasis noted within the lung bases. IMPRESSION: 1. Small right pleural effusion and pulmonary vascular congestion. 2. Scar versus atelectasis within the lung bases. Electronically Signed   By: Kerby Moors M.D.   On: 03/31/2021 07:26   CT Maxillofacial Wo Contrast  Result Date: 03/31/2021 CLINICAL DATA:  [place] from bed.  Trauma. EXAM: CT MAXILLOFACIAL WITHOUT CONTRAST CT CERVICAL SPINE WITHOUT CONTRAST TECHNIQUE: Multidetector CT imaging of the maxillofacial structures was performed.  Multiplanar CT image reconstructions were also generated. A small metallic BB was placed on the right temple in order to reliably differentiate right from left. Multidetector CT imaging of the cervical spine was performed without intravenous contrast. Multiplanar CT image reconstructions were also generated. RADIATION DOSE REDUCTION: This exam was performed according to the departmental dose-optimization program which includes automated exposure control, adjustment of the mA and/or kV according to patient size and/or use of iterative reconstruction technique. COMPARISON:  Cervical spine CT 03/21/2021 FINDINGS: CT MAXILLOFACIAL FINDINGS Osseous: No fracture or mandibular dislocation. No destructive process. Orbits: Negative. No traumatic or inflammatory finding.  Sinuses: Clear. Soft tissues: Scalp hematoma/contusion in the posterior left frontal region is incompletely visualized. Limited intracranial: See report for head CT performed at the same time. CT CERVICAL FINDINGS Alignment: Trace anterolisthesis of C4 on 5 is stable. Skull base and vertebrae: Stable compression deformity at C3 with stable degenerative endplate versus chronic posttraumatic inferior endplate deformity at C6. No evidence for an acute cervical spine fracture. Soft tissues and spinal canal: No prevertebral fluid or swelling. No visible canal hematoma. Disc levels: Chronic substantial loss of disc height noted C5-6 in C6-7. Diffuse facet degeneration noted bilaterally. Upper chest: Unremarkable. Other: None. IMPRESSION: 1. No evidence of acute facial bone fracture. 2. Scalp hematoma/contusion in the posterior left frontal region has been incompletely visualized. 3. No evidence of an acute cervical spine fracture or traumatic subluxation. 4. Chronic degenerative changes in the cervical spine as above. Electronically Signed   By: Misty Stanley M.D.   On: 03/31/2021 08:16    Review of Systems  Unable to perform ROS: Dementia  Blood pressure (!)  154/68, pulse (!) 107, temperature 97.6 F (36.4 C), temperature source Oral, resp. rate (!) 27, height 5' (1.524 m), weight 59 kg, SpO2 95 %. Estimated body mass index is 25.4 kg/m as calculated from the following:   Height as of this encounter: 5' (1.524 m).   Weight as of this encounter: 59 kg.  Physical Exam Constitutional:      General: She is not in acute distress. HENT:     Head: Normocephalic.      Right Ear: External ear normal.     Left Ear: External ear normal.     Nose: Nose normal.     Mouth/Throat:     Mouth: Mucous membranes are dry.     Pharynx: Oropharynx is clear.  Eyes:     Extraocular Movements: Extraocular movements intact.     Pupils: Pupils are equal, round, and reactive to light.  Cardiovascular:     Rate and Rhythm: Regular rhythm. Tachycardia present.     Pulses: Normal pulses.  Pulmonary:     Effort: Pulmonary effort is normal. No respiratory distress.  Abdominal:     General: Abdomen is flat.     Palpations: Abdomen is soft.  Musculoskeletal:     Cervical back: Normal range of motion and neck supple. No rigidity.  Skin:    General: Skin is warm and dry.     Capillary Refill: Capillary refill takes less than 2 seconds.  Neurological:     Mental Status: She is alert. Mental status is at baseline. She is confused.     Cranial Nerves: Cranial nerves 2-12 are intact.     Motor: No weakness.  Psychiatric:        Mood and Affect: Mood normal.    Assessment/Plan: Ms. Scruggs suffered a fall, striking her head. Initial imaging revealed possible SAH within the posterior quadrigeminal cistern. Repeat imaging revealed a small subdural collection in the right cerebral convexity. Recommend holding Eliquis for now. Follow up CT scan for tomorrow morning is ordered. Will readdress Eliquis after follow up imaging completed.   Julie Gilmore, DNP, AGNP-C Nurse Practitioner  [facility] [place] [address] [place] [address],  [place], [place] [address] P: [phone]     F: [phone]  03/31/2021, 2:05 PM

## 2021-03-31 NOTE — Progress Notes (Signed)
Pharmacy Antibiotic Note ? ?Julie Martinez is a 88 y.o. female admitted on 03/31/2021 presenting with fall, concern for sepsis.  Pharmacy has been consulted for vancomycin and cefepime dosing. ? ?Plan: ?Vancomycin 1250 mg IV x 1, then 1000 mg IV q 48h (eAUC 529, Goal AUC 400-550, SCr 1.3) ?Cefepime 2g IV q 24h  ?Monitor renal function, Cx and clinical progression to narrow ?Vancomycin levels as needed ? ?Height: 5' (152.4 cm) ?Weight: 59 kg (130 lb 1.1 oz) ?IBW/kg (Calculated) : 45.5 ? ?Temp (24hrs), Avg:98.3 ?F (36.8 ?C), Min:97.6 ?F (36.4 ?C), Max:98.9 ?F (37.2 ?C) ? ?Recent Labs  ?Lab 03/25/21 ?0249 03/26/21 ?0401 03/27/21 ?0153 03/31/21 ?0703 03/31/21 ?0938  ?WBC 11.4* 9.5 9.8 19.5*  --   ?CREATININE 1.47* 1.57* 1.37* 1.37* 1.30*  ?LATICACIDVEN  --   --   --  2.7*  --   ?  ?Estimated Creatinine Clearance: 24 mL/min (A) (by C-G formula based on SCr of 1.3 mg/dL (H)).   ? ?No Known Allergies ? ?Bertis Ruddy, PharmD ?Clinical Pharmacist ?ED Pharmacist Phone # [phone] ?03/31/2021 3:42 PM ? ?

## 2021-03-31 NOTE — ED Notes (Signed)
Smith MD at bedside ?

## 2021-03-31 NOTE — ED Triage Notes (Signed)
BIB [facility] from [facility]. Pt was there due to recent lt hip f surgery. Pt fell out of bed this am. Pt c/o lt hip pain, laceration to lt elbow. Pt is on blood thinners. H of dementia. Received fentanyl 87mg IN ? ? ?

## 2021-03-31 NOTE — ED Provider Notes (Signed)
?  Physical Exam  ?BP (!) 154/57 (BP Location: Right Arm)   Pulse 96   Temp 99.2 ?F (37.3 ?C) (Oral)   Resp (!) 24   Ht 5' (1.524 m)   Wt 59 kg   SpO2 99%   BMI 25.40 kg/m?  ? ?Physical Exam ? ?Procedures  ?Marland KitchenCritical Care ?Performed by: Gareth Morgan, MD ?Authorized by: Gareth Morgan, MD  ? ?Critical care provider statement:  ?  Critical care time (minutes):  30 ?  Critical care was time spent personally by me on the following activities:  Development of treatment plan with patient or surrogate, discussions with consultants, evaluation of patient's response to treatment, examination of patient, ordering and review of laboratory studies, ordering and review of radiographic studies, ordering and performing treatments and interventions, pulse oximetry, re-evaluation of patient's condition and review of old charts ? ?ED Course / MDM  ?  ? ?Received care from Dr. Dayna Barker. Please see his note for prior history and care.   ? ?Briefly, this is an 88yo female with history of dementia recent left hip surgery with prior hx of PE, diagnosis of DVT on eliquis who presented with unwitnessed fall.  CT head, CSpine, C/A/P orered by Dr. Dayna Barker  ? ?Reviewed CT images personally. CT head with small SAH, no CSpine fx, superior pubic ramus fx and other chronic appearing fractures.  ? ?Discussed with Orthopedics, WBAT for right superior pubic ramus fracture as well as recent left hip surgery. ? ?Consulted [facility] regarding the [facility], [place] NP and Dr. Arnoldo Morale who reviewed imaging and recommend repeat imaging in 6 hours. Do not recommend reversal of eliquis.  ? ?Family reports they would like to change facilities. Pt noted to have leukocytosis, mild lactic acidosis, saturations decreased to 88 on room air. No sign of pneumonia, pneumothorax.  CT done as w contrast but not ct angio, overall given on anticoagulation have low suspicion for acute PE, does have hx of one in the past.  Given combination of medical and traumatic concerns,  will admit for further care. ? ?Discussed with hospitalist and also with trauma surgery.  Per Dr. Grandville Silos, pt most appropriate for hospitalist.  [facility] consulting.  Repeat CT completed-reviewed by me without significant worsening--does show new or increased SDH, [facility] recommending repeat CT in the AM, holding eliquis dosing.   ? ? ?  ?Gareth Morgan, MD ?03/31/21 2217 ? ?

## 2021-04-01 ENCOUNTER — Observation Stay (HOSPITAL_COMMUNITY): Payer: Medicare HMO

## 2021-04-01 DIAGNOSIS — S065XAA Traumatic subdural hemorrhage with loss of consciousness status unknown, initial encounter: Secondary | ICD-10-CM | POA: Diagnosis present

## 2021-04-01 DIAGNOSIS — Z66 Do not resuscitate: Secondary | ICD-10-CM | POA: Diagnosis present

## 2021-04-01 DIAGNOSIS — W06XXXA Fall from bed, initial encounter: Secondary | ICD-10-CM | POA: Diagnosis present

## 2021-04-01 DIAGNOSIS — N1832 Chronic kidney disease, stage 3b: Secondary | ICD-10-CM | POA: Diagnosis present

## 2021-04-01 DIAGNOSIS — I129 Hypertensive chronic kidney disease with stage 1 through stage 4 chronic kidney disease, or unspecified chronic kidney disease: Secondary | ICD-10-CM | POA: Diagnosis present

## 2021-04-01 DIAGNOSIS — Z7901 Long term (current) use of anticoagulants: Secondary | ICD-10-CM | POA: Diagnosis not present

## 2021-04-01 DIAGNOSIS — Y92122 Bedroom in nursing home as the place of occurrence of the external cause: Secondary | ICD-10-CM | POA: Diagnosis not present

## 2021-04-01 DIAGNOSIS — Z79899 Other long term (current) drug therapy: Secondary | ICD-10-CM | POA: Diagnosis not present

## 2021-04-01 DIAGNOSIS — Z86718 Personal history of other venous thrombosis and embolism: Secondary | ICD-10-CM | POA: Diagnosis not present

## 2021-04-01 DIAGNOSIS — Z8249 Family history of ischemic heart disease and other diseases of the circulatory system: Secondary | ICD-10-CM | POA: Diagnosis not present

## 2021-04-01 DIAGNOSIS — R6510 Systemic inflammatory response syndrome (SIRS) of non-infectious origin without acute organ dysfunction: Secondary | ICD-10-CM | POA: Diagnosis present

## 2021-04-01 DIAGNOSIS — R54 Age-related physical debility: Secondary | ICD-10-CM | POA: Diagnosis present

## 2021-04-01 DIAGNOSIS — Z86711 Personal history of pulmonary embolism: Secondary | ICD-10-CM | POA: Diagnosis not present

## 2021-04-01 DIAGNOSIS — Z90710 Acquired absence of both cervix and uterus: Secondary | ICD-10-CM | POA: Diagnosis not present

## 2021-04-01 DIAGNOSIS — Z9181 History of falling: Secondary | ICD-10-CM | POA: Diagnosis not present

## 2021-04-01 DIAGNOSIS — E7800 Pure hypercholesterolemia, unspecified: Secondary | ICD-10-CM | POA: Diagnosis present

## 2021-04-01 DIAGNOSIS — S066XAA Traumatic subarachnoid hemorrhage with loss of consciousness status unknown, initial encounter: Secondary | ICD-10-CM | POA: Diagnosis present

## 2021-04-01 DIAGNOSIS — Z20822 Contact with and (suspected) exposure to covid-19: Secondary | ICD-10-CM | POA: Diagnosis present

## 2021-04-01 DIAGNOSIS — S32511A Fracture of superior rim of right pubis, initial encounter for closed fracture: Secondary | ICD-10-CM | POA: Diagnosis present

## 2021-04-01 DIAGNOSIS — Z515 Encounter for palliative care: Secondary | ICD-10-CM | POA: Diagnosis not present

## 2021-04-01 DIAGNOSIS — S0003XA Contusion of scalp, initial encounter: Secondary | ICD-10-CM | POA: Diagnosis not present

## 2021-04-01 DIAGNOSIS — I619 Nontraumatic intracerebral hemorrhage, unspecified: Secondary | ICD-10-CM | POA: Diagnosis present

## 2021-04-01 DIAGNOSIS — J90 Pleural effusion, not elsewhere classified: Secondary | ICD-10-CM | POA: Diagnosis present

## 2021-04-01 DIAGNOSIS — J9601 Acute respiratory failure with hypoxia: Secondary | ICD-10-CM | POA: Diagnosis present

## 2021-04-01 DIAGNOSIS — F0390 Unspecified dementia without behavioral disturbance: Secondary | ICD-10-CM | POA: Diagnosis present

## 2021-04-01 DIAGNOSIS — I629 Nontraumatic intracranial hemorrhage, unspecified: Secondary | ICD-10-CM | POA: Diagnosis not present

## 2021-04-01 DIAGNOSIS — S51812A Laceration without foreign body of left forearm, initial encounter: Secondary | ICD-10-CM | POA: Diagnosis present

## 2021-04-01 DIAGNOSIS — S51012A Laceration without foreign body of left elbow, initial encounter: Secondary | ICD-10-CM | POA: Diagnosis present

## 2021-04-01 DIAGNOSIS — I2510 Atherosclerotic heart disease of native coronary artery without angina pectoris: Secondary | ICD-10-CM | POA: Diagnosis present

## 2021-04-01 DIAGNOSIS — R296 Repeated falls: Secondary | ICD-10-CM | POA: Diagnosis present

## 2021-04-01 LAB — BASIC METABOLIC PANEL
Anion gap: 11 (ref 5–15)
BUN: 24 mg/dL — ABNORMAL HIGH (ref 8–23)
CO2: 23 mmol/L (ref 22–32)
Calcium: 7.9 mg/dL — ABNORMAL LOW (ref 8.9–10.3)
Chloride: 110 mmol/L (ref 98–111)
Creatinine, Ser: 1.44 mg/dL — ABNORMAL HIGH (ref 0.44–1.00)
GFR, Estimated: 35 mL/min — ABNORMAL LOW (ref >60)
Glucose, Bld: 101 mg/dL — ABNORMAL HIGH (ref 70–99)
Potassium: 3.8 mmol/L (ref 3.5–5.1)
Sodium: 144 mmol/L (ref 135–145)

## 2021-04-01 LAB — CBC
HCT: 24.9 % — ABNORMAL LOW (ref 36.0–46.0)
Hemoglobin: 7.8 g/dL — ABNORMAL LOW (ref 12.0–15.0)
MCH: 32.5 pg (ref 26.0–34.0)
MCHC: 31.3 g/dL (ref 30.0–36.0)
MCV: 103.8 fL — ABNORMAL HIGH (ref 80.0–100.0)
Platelets: 338 10*3/uL (ref 150–400)
RBC: 2.4 MIL/uL — ABNORMAL LOW (ref 3.87–5.11)
RDW: 14.2 % (ref 11.5–15.5)
WBC: 14.5 10*3/uL — ABNORMAL HIGH (ref 4.0–10.5)
nRBC: 0.8 % — ABNORMAL HIGH (ref 0.0–0.2)

## 2021-04-01 LAB — MRSA NEXT GEN BY PCR, NASAL: MRSA by PCR Next Gen: NOT DETECTED

## 2021-04-01 LAB — PROCALCITONIN: Procalcitonin: 1.38 ng/mL

## 2021-04-01 MED ORDER — SODIUM CHLORIDE 0.9 % IV SOLN
2.0000 g | INTRAVENOUS | Status: DC
  Administered 2021-04-01 – 2021-04-03 (×3): 2 g via INTRAVENOUS
  Filled 2021-04-01 (×3): qty 2

## 2021-04-01 NOTE — Progress Notes (Signed)
Subjective: ?The patient is alert and pleasant.  She knows she is in the hospital. ? ?Objective: ?Vital signs in last 24 hours: ?Temp:  [98.9 ?F (37.2 ?C)-99.2 ?F (37.3 ?C)] 99.2 ?F (37.3 ?C) (03/13 1600) ?Pulse Rate:  [90-115] 96 (03/13 1600) ?Resp:  [23-32] 24 (03/13 1600) ?BP: (140-166)/(49-71) 154/57 (03/13 1600) ?SpO2:  [88 %-99 %] 99 % (03/13 1600) ?Estimated body mass index is 25.4 kg/m? as calculated from the following: ?  Height as of this encounter: 5' (1.524 m). ?  Weight as of this encounter: 59 kg. ? ? ?Intake/Output from previous day: ?03/13 0701 - 03/14 0700 ?In: 501.2 [IV Piggyback:501.2] ?Out: 1100 [Urine:1100] ?Intake/Output this shift: ?No intake/output data recorded. ? ?Physical exam patient is alert and pleasant.  She is oriented to person and a hospital.  She is moving all 4 extremities. ? ?I reviewed the patient's follow-up head CT performed this morning.  The tiny subarachnoid hemorrhage in the quadrigeminal plate cistern has not changed.  The extra-axial fluid collection on the right is about the same. ? ?Lab Results: ?Recent Labs  ?  03/31/21 ?0703 03/31/21 ?0721 04/01/21 ?0250  ?WBC 19.5*  --  14.5*  ?HGB 8.9* 8.5* 7.8*  ?HCT 28.6* 25.0* 24.9*  ?PLT 424*  --  338  ? ?BMET ?Recent Labs  ?  03/31/21 ?0703 03/31/21 ?0721 04/01/21 ?0250  ?NA 146* 145 144  ?K 3.9 3.7 3.8  ?CL 110 112* 110  ?CO2 22  --  23  ?GLUCOSE 121* 116* 101*  ?BUN 26* 26* 24*  ?CREATININE 1.37* 1.30* 1.44*  ?CALCIUM 8.4*  --  7.9*  ? ? ?Studies/Results: ?CT HEAD WO CONTRAST (5MM) ? ?Result Date: 04/01/2021 ?CLINICAL DATA:  Evaluate for subdural hematoma. EXAM: CT HEAD WITHOUT CONTRAST TECHNIQUE: Contiguous axial images were obtained from the base of the skull through the vertex without intravenous contrast. RADIATION DOSE REDUCTION: This exam was performed according to the departmental dose-optimization program which includes automated exposure control, adjustment of the mA and/or kV according to patient size and/or use of  iterative reconstruction technique. COMPARISON:  03/31/2021. FINDINGS: Brain: Stable appearance of small focus of subarachnoid hemorrhage within the quadrigeminal cistern, image 32/6 and image 6/3. Previously described intermediate attenuating right cerebral convexity subdural hematoma is less conspicuous on today's study. No new areas of acute hemorrhage. No signs of mass effect. No midline shift identified. No signs of acute brain infarct. Bilateral cerebral hemisphere atrophy is again identified. Unchanged moderate diffuse low-attenuation within the subcortical and periventricular white matter compatible with chronic microvascular disease. Vascular: No hyperdense vessel or unexpected calcification. Skull: Normal. Negative for fracture or focal lesion. Sinuses/Orbits: Paranasal sinuses and mastoid air cells are clear. Other: Left frontoparietal scalp hematoma is again identified, image 28/3. This measures 1.2 cm in thickness, image 28/3. Previously 1.4 cm. IMPRESSION: 1. Stable appearance of small focus of subarachnoid hemorrhage within the quadrigeminal cistern. 2. Previously described intermediate attenuating right cerebral convexity subdural hematoma is less conspicuous on today's study. 3. Chronic small vessel ischemic disease and brain atrophy. Electronically Signed   By: Kerby Moors M.D.   On: 04/01/2021 05:38  ? ?CT Head Wo Contrast ? ?Result Date: 03/31/2021 ?CLINICAL DATA:  Head trauma, minor (Age >= 65y) 6 hour REPEAT EXAM: CT HEAD WITHOUT CONTRAST TECHNIQUE: Contiguous axial images were obtained from the base of the skull through the vertex without intravenous contrast. RADIATION DOSE REDUCTION: This exam was performed according to the departmental dose-optimization program which includes automated exposure control, adjustment of the  mA and/or kV according to patient size and/or use of iterative reconstruction technique. COMPARISON:  CT head March 31, 2021. FINDINGS: Brain: New or increased  intermediate density 6 mm thick acute right cerebral convexity subdural hematoma. No significant midline shift. Stable small volume of subarachnoid hemorrhage within the quadrigeminal cistern. No evidence of acute large vascular territory infarct, mass lesion, or hydrocephalus. Vascular: Calcific intracranial atherosclerosis. No hyperdense vessel identified. Skull: Left frontal contusion without evidence of acute calvarial fracture. Sinuses/Orbits: Further evaluated on same day maxillofacial CT. Other: No mastoid effusions. IMPRESSION: 1. New or increased intermediate density 6 mm thick acute right cerebral convexity subdural hematoma. In retrospect, there may have been a subtle trace right-sided subdural collection on the prior but this is uncertain. No significant midline shift. Recommend continued imaging follow-up. 2. Stable small volume of subarachnoid hemorrhage within the quadrigeminal cistern. 3. Left frontal contusion without acute calvarial fracture These results will be called to the ordering clinician or representative by the Radiologist Assistant, and communication documented in the PACS or [employer]. Electronically Signed   By: Margaretha Sheffield M.D.   On: 03/31/2021 14:11  ? ?CT HEAD WO CONTRAST ? ?Result Date: 03/31/2021 ?CLINICAL DATA:  Head trauma, moderate-severe; fall EXAM: CT HEAD WITHOUT CONTRAST TECHNIQUE: Contiguous axial images were obtained from the base of the skull through the vertex without intravenous contrast. RADIATION DOSE REDUCTION: This exam was performed according to the departmental dose-optimization program which includes automated exposure control, adjustment of the mA and/or kV according to patient size and/or use of iterative reconstruction technique. COMPARISON:  03/21/2021 FINDINGS: Brain: There is a new small subcentimeter focus of hyperdensity within the right parasagittal aspect of the quadrigeminal plate cistern (series 3, image 13). No additional acute hemorrhage  is identified. No new loss of gray-white differentiation. Stable findings of probable chronic microvascular ischemic changes in the cerebral white matter. Asymmetric low-density in the left pons is probably artifactual. Stable prominence of ventricles and sulci reflecting parenchymal volume loss. No extra-axial collection. Vascular: There is atherosclerotic calcification at the skull base. Skull: Calvarium is unremarkable. Sinuses/Orbits: Refer to maxillofacial CT Other: Left frontal scalp hematoma.  Mastoid air cells are clear. IMPRESSION: New minimal focal subarachnoid hemorrhage within the posterior quadrigeminal cistern. Otherwise stable intracranial appearance. These results were called by telephone at the time of interpretation on 03/31/2021 at 8:19 am to provider Dr. Billy Fischer, who verbally acknowledged these results. Electronically Signed   By: Macy Mis M.D.   On: 03/31/2021 08:19  ? ?CT CERVICAL SPINE WO CONTRAST ? ?Result Date: 03/31/2021 ?CLINICAL DATA:  [place] from bed.  Trauma. EXAM: CT MAXILLOFACIAL WITHOUT CONTRAST CT CERVICAL SPINE WITHOUT CONTRAST TECHNIQUE: Multidetector CT imaging of the maxillofacial structures was performed. Multiplanar CT image reconstructions were also generated. A small metallic BB was placed on the right temple in order to reliably differentiate right from left. Multidetector CT imaging of the cervical spine was performed without intravenous contrast. Multiplanar CT image reconstructions were also generated. RADIATION DOSE REDUCTION: This exam was performed according to the departmental dose-optimization program which includes automated exposure control, adjustment of the mA and/or kV according to patient size and/or use of iterative reconstruction technique. COMPARISON:  Cervical spine CT 03/21/2021 FINDINGS: CT MAXILLOFACIAL FINDINGS Osseous: No fracture or mandibular dislocation. No destructive process. Orbits: Negative. No traumatic or inflammatory finding. Sinuses:  Clear. Soft tissues: Scalp hematoma/contusion in the posterior left frontal region is incompletely visualized. Limited intracranial: See report for head CT performed at the same time. CT CERVICAL FINDINGS  Align

## 2021-04-01 NOTE — Consult Note (Signed)
[facility] Nurse wound consult note ?Consultation was completed by review of records, images and assistance from the bedside nurse/clinical staff.  ? ?Reason for Consult: skin tear ?Patient sustained fall at home from bed ?Wound type: trauma  ?Pressure Injury POA: NA ?Measurement: see nursing flow sheet; WOC reviewed image ?Wound bed: ruddy, skin flap not intact to re-approximate  ?Periwound: frail, fragile skin  ?Dressing procedure/placement/frequency:  ?Single layer xeroform over skin tears, top with silicone foam. Change every 3 days to not disrupt wound bed. Per nursing skin care order set.  ? ?Discussed POC with  bedside nurse via secure chart ?Re consult if needed, will not follow at this time. ?Thanks ? Guillermo Difrancesco [facility] MSN, RN,CWOCN, CNS, CWON-AP [phone])  ?

## 2021-04-01 NOTE — Progress Notes (Signed)
?PROGRESS NOTE ? ? ? ?Julie Martinez  GUR:[id number] DOB: 02-11-1931 DOA: 03/31/2021 ?PCP: Cari Caraway, MD  ? ? ?Brief Narrative:  ?DVTCarolyn A Martinez is an 87 year old female with past medical history significant for essential pretension, CKD stage IIIb, DVT/PE on Eliquis, advanced dementia who presented to [facility] ED from [facility] SNF after having a fall out of bed. History is mostly obtained from review of records as the patient has history of dementia.  Patient had she had just recently been hospitalized from 3/3-3/9 after having a fall at home found to have a closed left hip fracture s/p left hip hemiarthroplasty on 3/5.  Eliquis had been temporarily held for the surgery, and was restarted following the procedure. Patient was found by staff after having a fall out of bed this morning with hematoma to the left forehead, skin tear noted to left forearm, and complaints of left hip pain. ?  ?In the ED, patient was afebrile, pulse 90-115, respiration 22-32, blood pressures elevated up to 170/69, and O2 saturations noted as low as 88% on room air with improvement on 2 L of nasal cannula oxygen. Labs significant for WBC 19.5, hemoglobin 8.9, sodium 146, BUN 26, creatinine 1.37.  CT scan of the head noted minimal focal subarachnoid hemorrhage within the posterior quadrigeminal cistern.  CT scan of the chest abdomen and pelvis noted acute on chronic fracture of the right superior pubic rami, unchanged compression fractures of T8 along with L4, age-indeterminate L3 compression fracture, chronic appearing bilateral rib fractures, small left-sided pleural effusion, gallstone, right inguinal hernia containing fat with small pleural fluid, and coronary calcifications.  Neurosurgery was consulted and recommended repeating CT scan of the head in 6 hours and possible discharge home. Urinalysis noted trace leukocytes with moderate hemoglobin, ketones, and no significant bacteria seen.  [facility] was called to admit, but initially  deferred admission until repeat CT scan obtained.  Repeat CT scan showed small subdural collection of the right cerebral convexity.  Neurosurgery evaluated and recommended holding Eliquis and rechecking repeat CT scan of the brain in a.m. [facility] consulted for admission.  ? ? ?Assessment & Plan: ?  ?Assessment and Plan: ?* Intracranial hemorrhage ([place]) secondary to fall ?Patient presented after having a fall out of bed at skilled nursing facility.  Initial CT head with possible subarachnoid hemorrhage within the posterior quadrigeminal cistern.  Repeat imaging showed small subdural collection in the right cerebral convexity.  Neurosurgery had already been consulted and recommended holding Eliquis with repeat CT head 3/14 with stable appearance of the small focus of SAH and previously described intermediate attenuating right cerebral convexity SDH less conspicuous.  Reevaluated by neurosurgery 3/14, Dr. Arnoldo Morale; stable and now signed off. ?--Fall precautions, neurochecks ?--Continue to hold Eliquis ?--Supportive care ? ?Acute respiratory failure with hypoxia ([place]) ?Patient was noted to have O2 saturations as low as 88% on room air on arrival.  Patient was placed on 2 liters of nasal cannula oxygen with improvement in O2 saturations.  BNP slightly elevated with some pitting lower from edema with patient was given IV furosemide.  CT chest with small left pleural effusion, no significant pulmonary edema or focal consolidation. ?--Continue supple oxygen, maintain SPO2 >/= 92% ?--[place] x-ray in the a.m. ? ? ?SIRS (systemic inflammatory response syndrome) (HCC) ?Upon admission into the emergency department patient was noted to be tachycardic and tachypneic meeting SIRS criteria.  Labs significant for WBC 19.5 and lactic acid of 2.7.  Urinalysis did not note significant signs of infection.  Imaging of  the chest, abdomen pelvis did not give any clear source of infection.  Patient had just recently had surgery on her left hip.   MRSA screening negative.  Procalcitonin 1.38. ?--Blood cultures x2: No growth less than 24 hours ?--Discontinue vancomycin with negative MRSA PCR ?--Continue cefepime for now ?--Repeat procalcitonin and CBC in the a.m. ? ?Chronic kidney disease (CKD) stage G3b/A1, moderately decreased glomerular filtration rate (GFR) between 30-44 mL/min/1.73 square meter and albuminuria creatinine ratio less than 30 mg/g (HCC) ?Baseline creatinine 1.4-1.6.;  Stable. ?--Cr 1.37>1.30>1.44 ?--Avoid nephrotoxins, renal dose all medications ?--BMP in a.m. ? ?Essential hypertension ?On admission blood pressure was elevated up to 170/69. Home medication regimen includes amlodipine 5 mg daily. ?--Continue amlodipine 5 mg daily ?--Hydralazine '25mg'$  PO q8h PRN SBP >180 or DBP >110 ?--continue to monitor BP closely ? ?Dementia ([place]) ?Medication regimen includes donepezil 10 mg nightly and Namenda 10 mg twice daily ?--Delirium precautions ?--Get up during the day ?--Encourage a familiar face to remain present throughout the day ?--Keep blinds open and lights on during daylight hours ?--Minimize the use of opioids/benzodiazepines ? ? ?Personal history of DVT (deep vein thrombosis) ?Vascular duplex ultrasound bilateral lower extremities 03/22/2021 with no evidence of DVT in either lower extremity. ?--Holding home Eliquis for now ? ?Closed fracture of superior pubic ramus ([place]) ?Acute on chronic.  Patient was noted to have  superior ramus fracture on the right.  Orthopedics recommended weightbearing as tolerated. ?--Tramadol as needed for pain ?-- PT/OT recommend return to SNF. ? 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  Repeat vascular duplex ultrasound lower extremities March 2023 with no DVT noted. ?--Hold Eliquis ? ?Fall ?--Continue fall precautions ? ? ? ? ?DVT prophylaxis: SCDs Start: 03/31/21 1501 ? ?  Code Status: DNR ?Family Communication: No  family present at bedside this morning ? ?Disposition Plan:  ?Level of care: Progressive ?Status is: Observation ?The patient remains OBS appropriate and will d/c before 2 midnights. ?  ? ?Consultants:  ?Neurosurgery ? ?Procedures:  ?None ? ?Antimicrobials:  ?Vancomycin 3/13 - 3/14 ?Cefepime 3/13>> ? ? ?Subjective: ?Patient seen examined bedside, resting comfortably.  Pleasantly confused.  No family present.  Complaining about skin tear to left elbow.  No other questions or concerns at this time.  Denies headache, no chest pain, no shortness of breath, no abdominal pain.  No acute events overnight per nurse staff. ? ?Objective: ?Vitals:  ? 03/31/21 1600 04/01/21 0946 04/01/21 1124 04/01/21 1210  ?BP: (!) 154/57 (!) 136/50  (!) 130/52  ?Pulse: 96 74  84  ?Resp: (!) 24 20  (!) 23  ?Temp: 99.2 ?F (37.3 ?C) 99.5 ?F (37.5 ?C)  99 ?F (37.2 ?C)  ?TempSrc: Oral Axillary  Oral  ?SpO2: 99% 98% 98% 91%  ?Weight:      ?Height:      ? ?No intake or output data in the 24 hours ending 04/01/21 1421 ?Filed Weights  ? 03/31/21 0722  ?Weight: 59 kg  ? ? ?Examination: ? ?Physical Exam: ?GEN: NAD, alert, pleasantly confused, chronically ill, elderly in appearance ?HEENT: NCAT, PERRL, EOMI, sclera clear, MMM ?PULM: CTAB w/o wheezes/crackles, normal respiratory effort, on 1 L nasal cannula ?CV: RRR w/o M/G/R ?GI: abd soft, NTND, NABS, no R/G/M ?MSK: no peripheral edema, muscle strength globally intact 5/5 bilateral upper/lower extremities ?NEURO: CN II-XII intact, no focal deficits, sensation to light touch intact ?PSYCH: normal mood/affect ?Integumentary: Multiple areas of ecchymosis  in various stages of healing, left elbow skin tear noted with dressing in place, left lower extremity surgical incision with dressing in place; clean/dry/intact ? ? ? ?Data Reviewed: I have personally reviewed following labs and imaging studies ? ?CBC: ?Recent Labs  ?Lab 03/26/21 ?0401 03/27/21 ?0153 03/31/21 ?0703 03/31/21 ?0102 04/01/21 ?0250  ?WBC 9.5  9.8 19.5*  --  14.5*  ?NEUTROABS 7.0 6.8  --   --   --   ?HGB 7.8* 8.2* 8.9* 8.5* 7.8*  ?HCT 24.6* 26.9* 28.6* 25.0* 24.9*  ?MCV 104.2* 103.5* 105.9*  --  103.8*  ?PLT 221 269 424*  --  338  ? ?Basic Metab

## 2021-04-01 NOTE — Assessment & Plan Note (Addendum)
Baseline creatinine 1.4-1.6.;  Stable.  Creatinine 1.32 at time of discharge. ?

## 2021-04-01 NOTE — Assessment & Plan Note (Addendum)
Vascular duplex ultrasound bilateral lower extremities 03/22/2021 with no evidence of DVT in either lower extremity.  Eliquis now discontinued due to intraparenchymal hemorrhage and fall risk with plan to transition to hospice on discharge at home. ?

## 2021-04-01 NOTE — Progress Notes (Signed)
Bladder Scan 330 ml. She has been In and Out catheterized twice in the last couple days. Per order, Foley placed now .  ?

## 2021-04-01 NOTE — [place] FL2 (Signed)
?[place] MEDICAID FL2 LEVEL OF CARE SCREENING TOOL  ?  ? ?IDENTIFICATION  ?Patient Name: ?Julie Martinez Birthdate: December 06, 1931 Sex: female Admission Date (Current Location): ?03/31/2021  ?[place] and [place] Number: ? [place] ?  Facility and Address:  ?[facility], [place] [address], [place] [address] ?     Provider Number: ?[id number]  ?Attending Physician Name and Address:  ?[place] Eric J, DO ? Relative Name and Phone Number:  ?  ?   ?Current Level of Care: ?Hospital Recommended Level of Care: ?[place] Prior Approval Number: ?  ? ?Date Approved/Denied: ?  PASRR Number: ?[id number] A ? ?Discharge Plan: ?SNF ?  ? ?Current Diagnoses: ?Patient Active Problem List  ? Diagnosis Date Noted  ? SIRS (systemic inflammatory response syndrome) ([place]) 03/31/2021  ? Subarachnoid hemorrhage ([place]) 03/31/2021  ? Closed fracture of superior pubic ramus ([place]) 03/31/2021  ? 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 Intracranial hemorrhage ([place]) secondary to fall 03/31/2021  ? Acute respiratory failure with hypoxia ([place]) 03/31/2021  ? Chronic anticoagulation 03/31/2021  ? Acute postoperative anemia due to expected blood loss 03/26/2021  ? Personal history of DVT (deep vein thrombosis) 03/26/2021  ? Chronic kidney disease (CKD) stage G3b/A1, moderately decreased glomerular filtration rate (GFR) between 30-44 mL/min/1.73 square meter and albuminuria creatinine ratio less than 30 mg/g (HCC) 03/26/2021  ? Closed left hip fracture ([place]) 03/21/2021  ? Essential hypertension 01/31/2018  ? CAP (community acquired pneumonia) 01/30/2018  ? Pneumonia 01/30/2018  ? Dementia ([place]) 01/04/2017  ? Fall 01/04/2017  ? Gait abnormality 01/04/2017  ? Left lumbar radiculopathy 05/25/2016  ? Complaints of memory disturbance 01/28/2015  ? Depression 01/28/2015  ? ? ?Orientation RESPIRATION BLADDER Height & Weight   ?  ?Self, Place ? O2 ([place] 2L) Incontinent Weight: 130 lb  1.1 oz (59 kg) ?Height:  5' (152.4 cm)  ?BEHAVIORAL SYMPTOMS/MOOD NEUROLOGICAL BOWEL NUTRITION STATUS  ?    Incontinent, Continent Diet (see DC summary)  ?AMBULATORY STATUS COMMUNICATION OF NEEDS Skin   ?Extensive Assist Verbally Surgical wounds (left hip) ?  ?  ?  ?    ?     ?     ? ? ?Personal Care Assistance Level of Assistance  ?Bathing, Feeding, Dressing Bathing Assistance: Maximum assistance ?Feeding assistance: Limited assistance ?Dressing Assistance: Maximum assistance ?   ? ?Functional Limitations Info  ?Speech   ?  ?Speech Info: Impaired (slurred)  ? ? ?SPECIAL CARE FACTORS FREQUENCY  ?PT (By licensed PT), OT (By licensed OT)   ?  ?PT Frequency: 5x/wk ?OT Frequency: 5x/wk ?  ?  ?  ?   ? ? ?Contractures Contractures Info: Not present  ? ? ?Additional Factors Info  ?Code Status, Allergies Code Status Info: DNR ?Allergies Info: NKA ?  ?  ?  ?   ? ?Current Medications (04/01/2021):  This is the current hospital active medication list ?Current Facility-Administered Medications  ?Medication Dose Route Frequency Provider Last Rate Last Admin  ? acetaminophen (TYLENOL) tablet 650 mg  650 mg Oral Q6H PRN Norval Morton, MD      ? Or  ? acetaminophen (TYLENOL) suppository 650 mg  650 mg Rectal Q6H PRN Fuller Plan A, MD      ? albuterol (PROVENTIL) (2.5 MG/3ML) 0.083% nebulizer solution 2.5 mg  2.5 mg Nebulization Q4H PRN Smith, Rondell A, MD      ? amLODipine (NORVASC) tablet 5 mg  5 mg Oral Daily Smith, Rondell  A, MD   5 mg at 04/01/21 1005  ? ceFEPIme (MAXIPIME) 2 g in sodium chloride 0.9 % 100 mL IVPB  2 g Intravenous Q24H [place] Donnamarie Poag, DO 200 mL/hr at 04/01/21 1032 2 g at 04/01/21 1032  ? docusate sodium (COLACE) capsule 100 mg  100 mg Oral BID Fuller Plan A, MD   100 mg at 04/01/21 1005  ? donepezil (ARICEPT) tablet 10 mg  10 mg Oral QHS Smith, Rondell A, MD   10 mg at 03/31/21 2218  ? ferrous sulfate tablet 325 mg  325 mg Oral Q breakfast Fuller Plan A, MD   325 mg at 04/01/21 1005  ? hydrALAZINE  (APRESOLINE) injection 10 mg  10 mg Intravenous Q4H PRN Fuller Plan A, MD      ? memantine (NAMENDA) tablet 10 mg  10 mg Oral BID Fuller Plan A, MD   10 mg at 04/01/21 1005  ? mirtazapine (REMERON) tablet 7.5 mg  7.5 mg Oral QHS Smith, Rondell A, MD   7.5 mg at 03/31/21 2217  ? ondansetron (ZOFRAN) tablet 4 mg  4 mg Oral Q6H PRN Norval Morton, MD      ? Or  ? ondansetron (ZOFRAN) injection 4 mg  4 mg Intravenous Q6H PRN Smith, Rondell A, MD      ? polyethylene glycol (MIRALAX / GLYCOLAX) packet 17 g  17 g Oral Daily PRN Tamala Julian, Rondell A, MD      ? sodium chloride flush (NS) 0.9 % injection 3 mL  3 mL Intravenous Q12H Smith, Rondell A, MD      ? traMADol (ULTRAM) tablet 50 mg  50 mg Oral Q6H PRN Fuller Plan A, MD   50 mg at 04/01/21 1021  ? [START ON 04/02/2021] vancomycin (VANCOCIN) IVPB 1000 mg/200 mL premix  1,000 mg Intravenous Q48H Bertis Ruddy, RPH      ? ? ? ?Discharge Medications: ?Please see discharge summary for a list of discharge medications. ? ?Relevant Imaging Results: ? ?Relevant Lab Results: ? ? ?Additional Information ?SS#: [id number]; Not vaccinated for covid ? ?Geralynn Ochs, LCSW ? ? ? ? ?

## 2021-04-01 NOTE — Hospital Course (Signed)
DVTCarolyn A Martinez is an 88 year old female with past medical history significant for essential pretension, CKD stage IIIb, DVT/PE on Eliquis, advanced dementia who presented [facility] ED from [facility] after having a fall out of bed. History is mostly obtained from review of records as the patient has history of dementia.  Patient had she had just recently been hospitalized from 3/3-3/9 after having a fall at home found to have a closed left hip fracture s/p left hip hemiarthroplasty on 3/5.  Eliquis had been temporarily held for the surgery, and was restarted following the procedure. Patient was found by staff after having a fall out of bed this morning with hematoma to the left forehead, skin tear noted to left forearm, and complaints of left hip pain. ?  ?In the ED, patient was afebrile, pulse 90-115, respiration 22-32, blood pressures elevated up to 170/69, and O2 saturations noted as low as 88% on room air with improvement on 2 L of nasal cannula oxygen. Labs significant for WBC 19.5, hemoglobin 8.9, sodium 146, BUN 26, creatinine 1.37.  CT scan of the head noted minimal focal subarachnoid hemorrhage within the posterior quadrigeminal cistern.  CT scan of the chest abdomen and pelvis noted acute on chronic fracture of the right superior pubic rami, unchanged compression fractures of T8 along with L4, age-indeterminate L3 compression fracture, chronic appearing bilateral rib fractures, small left-sided pleural effusion, gallstone, right inguinal hernia containing fat with small pleural fluid, and coronary calcifications.  Neurosurgery was consulted and recommended repeating CT scan of the head in 6 hours and possible discharge home. Urinalysis noted trace leukocytes with moderate hemoglobin, ketones, and no significant bacteria seen.  [facility] was called to admit, but initially deferred admission until repeat CT scan obtained.  Repeat CT scan showed small subdural collection of the right cerebral convexity.   Neurosurgery evaluated and recommended holding Eliquis and rechecking repeat CT scan of the brain in a.m. [facility] consulted for admission. ?

## 2021-04-01 NOTE — Evaluation (Signed)
Occupational Therapy Evaluation ?Patient Details ?Name: Julie Martinez ?MRN: [id number] ?DOB: 1931-10-24 ?Today's Date: 04/01/2021 ? ? ?History of Present Illness Julie Martinez is a 88 y.o. female with medical history significant of hypertension, CKD stage IIIb, advanced dementia, DVT/PE on Eliquis, presents after having a fall out of bed.  History is mostly obtained from review of records as the patient has history of dementia.  Patient had she had just recently been hospitalized from 3/3-3/9 after having a fall at home found to have a closed left hip fracture s/p left hip hemiarthroplasty on 3/5.  Eliquis had been temporarily held for the surgery, and was restarted following the procedure.  Patient was found by staff after having a fall out of bed this morning with hematoma to the left forehead, skin tear noted to left forearm, and complaints of left hip pain. Patient now with R pubic rami fx and subarachnoid hemorrhage.  ? ?Clinical Impression ?  ?Pt admitted with the above diagnoses and presents with below problem list. Pt will benefit from continued acute OT to address the below listed deficits and maximize independence with basic ADLs prior to d/c back to SNF for rehab. At baseline, pt lives at home, needs some assist with basic ADLs. Pt limited by 8/10 FACEs pain with BLE movement, premedicated. Unable to safely attempt standing this session. Noted, pt with acute R pubic rami fx found during this admission. Recommend orthopedic consult for clarification on any restrictions to mobility, weight-bearing and/or ROM.  ?  ?   ? ?Recommendations for follow up therapy are one component of a multi-disciplinary discharge planning process, led by the attending physician.  Recommendations may be updated based on patient status, additional functional criteria and insurance authorization.  ? ?Follow Up Recommendations ? Skilled nursing-short term rehab (<3 hours/day)  ?  ?Assistance Recommended at Discharge Intermittent  Supervision/Assistance  ?Patient can return home with the following Two people to help with walking and/or transfers;A lot of help with bathing/dressing/bathroom;Assistance with cooking/housework;Help with stairs or ramp for entrance ? ?  ?Functional Status Assessment ? Patient has had a recent decline in their functional status and demonstrates the ability to make significant improvements in function in a reasonable and predictable amount of time.  ?Equipment Recommendations ? Other (comment) (defer to next venue of care)  ?  ?Recommendations for Other Services  Orthopedic consult (acute R pubic rami fx) ? ? ?  ?Precautions / Restrictions Precautions ?Precautions: Fall ?Precaution Comments: Stress incontinence - would benefit from donning a brief prior to mobility. ?Restrictions ?Weight Bearing Restrictions: Yes ?LLE Weight Bearing: Weight bearing as tolerated ?Other Position/Activity Restrictions: no new WB orders for R pelvic fx.  ? ?  ? ?Mobility Bed Mobility ?Overal bed mobility: Needs Assistance ?Bed Mobility: Supine to Sit, Sit to Supine, Rolling ?Rolling: Total assist, +2 for physical assistance ?  ?Supine to sit: HOB elevated, +2 for physical assistance, Max assist ?Sit to supine: Max assist, +2 for physical assistance ?  ?General bed mobility comments: Unable to move either LE to assist with mobility due to pain ?  ? ?Transfers ?  ?  ?  ?  ?  ?  ?  ?  ?  ?General transfer comment: not attempted due to severe pain in sitting up on side of bed. No new WB orders. ?  ? ?  ?Balance Overall balance assessment: Needs assistance ?Sitting-balance support: Feet supported ?Sitting balance-Leahy Scale: Fair ?Sitting balance - Comments: sits with BUE support EOB without LOB, close supervision ?  ?  ?  ?  ?  ?  ?  ?  ?  ?  ?  ?  ?  ?  ?  ?   ? ?  ADL either performed or assessed with clinical judgement  ? ?ADL Overall ADL's : Needs assistance/impaired ?Eating/Feeding: Set up;Sitting ?  ?Grooming: Maximal  assistance;Sitting ?  ?Upper Body Bathing: Maximal assistance;Sitting ?  ?Lower Body Bathing: Total assistance;Bed level ?  ?Upper Body Dressing : Maximal assistance;Sitting ?  ?Lower Body Dressing: Total assistance;Bed level ?  ?  ?  ?  ?  ?  ?  ?  ?General ADL Comments: BUE support needed in static sitting EOB d/t pain and generalized weakness  ? ? ? ?Vision   ?   ?   ?Perception   ?  ?Praxis   ?  ? ?Pertinent Vitals/Pain Pain Assessment ?Pain Assessment: Faces ?Faces Pain Scale: Hurts whole lot ?Pain Location: B LE, pelvis with movement ?Pain Descriptors / Indicators: Grimacing, Sore, Discomfort, Moaning ?Pain Intervention(s): Monitored during session, Limited activity within patient's tolerance, Repositioned, Premedicated before session  ? ? ? ?Hand Dominance Right ?  ?Extremity/Trunk Assessment Upper Extremity Assessment ?Upper Extremity Assessment: Generalized weakness;Difficult to assess due to impaired cognition ?  ?Lower Extremity Assessment ?Lower Extremity Assessment: Defer to PT evaluation ?RLE: Unable to fully assess due to pain ?RLE Coordination: decreased gross motor ?LLE: Unable to fully assess due to pain ?LLE Coordination: decreased gross motor ?  ?Cervical / Trunk Assessment ?Cervical / Trunk Assessment: Kyphotic ?  ?Communication Communication ?Communication: No difficulties ?  ?Cognition Arousal/Alertness: Awake/alert ?Behavior During Therapy: Anxious ?Overall Cognitive Status: History of cognitive impairments - at baseline ?  ?  ?  ?  ?  ?  ?  ?  ?  ?  ?  ?  ?  ?  ?  ?  ?General Comments: history of advanced dementia Alert, oriented to place, self ?  ?  ?General Comments    ? ?  ?Exercises   ?  ?Shoulder Instructions    ? ? ?Home Living Family/patient expects to be discharged to:: Skilled nursing facility ?Living Arrangements: Children ?Available Help at Discharge: Family ?Type of Home: House ?Home Access: Stairs to enter ?Entrance Stairs-Number of Steps: 1 ?Entrance Stairs-Rails: None ?Home  Layout: Able to live on main level with bedroom/bathroom ?  ?  ?Bathroom Shower/Tub: Tub/shower unit;Curtain ?  ?Bathroom Toilet: Standard ?  ?  ?Home Equipment: Rolling Walker (2 wheels);BSC/3in1;Wheelchair - manual ?  ?Additional Comments: daughter bathes pt in bed/on toilet at baseline. Pt from [facility] rehab stay after recent admission. ?  ? ?  ?Prior Functioning/Environment Prior Level of Function : Needs assist ?  ?  ?  ?Physical Assist : ADLs (physical) ?  ?ADLs (physical): Bathing ?Mobility Comments: prior to fall which led to femur fracture, she was ambulating with RW and assist ?ADLs Comments: daughter was assisting with bathing/dressing ?  ? ?  ?  ?OT Problem List: Decreased strength;Decreased range of motion;Decreased activity tolerance;Impaired balance (sitting and/or standing);Decreased cognition;Decreased safety awareness;Decreased knowledge of use of DME or AE;Pain ?  ?   ?OT Treatment/Interventions: Self-care/ADL training;Therapeutic exercise;Energy conservation;DME and/or AE instruction;Therapeutic activities;Visual/perceptual remediation/compensation;Patient/family education;Balance training  ?  ?OT Goals(Current goals can be found in the care plan section) Acute Rehab OT Goals ?Patient Stated Goal: none stated ?OT Goal Formulation: With patient ?Time For Goal Achievement: 04/15/21 ?Potential to Achieve Goals: Fair ?ADL Goals ?Pt Will Perform Grooming: with min assist;sitting ?Pt Will Perform Upper Body Dressing: with min assist;sitting ?Pt Will Perform Lower Body Dressing: with max assist;sit to/from stand ?Pt Will Transfer to Toilet: with max assist;stand pivot transfer;bedside commode ?Pt Will Perform Toileting - Clothing  Manipulation and hygiene: with max assist;sit to/from stand ?Additional ADL Goal #1: Pt will complete bed mobility at min A level to prepare for EOB/OOB ADLs.  ?OT Frequency: Min 2X/week ?  ? ?Co-evaluation PT/OT/SLP Co-Evaluation/Treatment: Yes ?Reason for Co-Treatment: For  patient/therapist safety;To address functional/ADL transfers;Necessary to address cognition/behavior during functional activity ?PT goals addressed during session: Mobility/safety with mobility;Balance ?OT goals addressed

## 2021-04-01 NOTE — Assessment & Plan Note (Addendum)
Fall precautions.  

## 2021-04-01 NOTE — Significant Event (Signed)
Pt voiced she could not "pee" and was in pain, abdomen assessed and noted to be very distended, bladder scanned noted with >958. This RN assisted primary RN with in and out straight cath; 1442m emptied from bladder with 2-3 mins breaks after each 5093m.  ?

## 2021-04-01 NOTE — Plan of Care (Signed)

## 2021-04-01 NOTE — Evaluation (Signed)
Physical Therapy Evaluation ?Patient Details ?Name: Julie Martinez ?MRN: [id number] ?DOB: 15-Aug-1931 ?Today's Date: 04/01/2021 ? ?History of Present Illness ? Julie Martinez is a 88 y.o. female with medical history significant of hypertension, CKD stage IIIb, advanced dementia, DVT/PE on Eliquis, presents after having a fall out of bed.  History is mostly obtained from review of records as the patient has history of dementia.  Patient had she had just recently been hospitalized from 3/3-3/9 after having a fall at home found to have a closed left hip fracture s/p left hip hemiarthroplasty on 3/5.  Eliquis had been temporarily held for the surgery, and was restarted following the procedure.  Patient was found by staff after having a fall out of bed this morning with hematoma to the left forehead, skin tear noted to left forearm, and complaints of left hip pain. Patient now with R pubic rami fx and subarachnoid hemorrhage. ?  ?Clinical Impression ? Patient received in bed, she is agreeable to PT/OT assessment. Patient required max +2 to total assist for all bed mobility due to pain in B LEs/groin/hip area. Once positioned at edge of bed patient able to maintain sitting balance x 10 min with close supervision. She will continue to benefit from skilled PT while here to improve functional mobility and independence.      ?   ? ?Recommendations for follow up therapy are one component of a multi-disciplinary discharge planning process, led by the attending physician.  Recommendations may be updated based on patient status, additional functional criteria and insurance authorization. ? ?Follow Up Recommendations Skilled nursing-short term rehab (<3 hours/day) ? ?  ?Assistance Recommended at Discharge Frequent or constant Supervision/Assistance  ?Patient can return home with the following ? A lot of help with bathing/dressing/bathroom;Two people to help with walking and/or transfers;Direct supervision/assist for medications  management;Direct supervision/assist for financial management;Assist for transportation;Help with stairs or ramp for entrance;Assistance with feeding ? ?  ?Equipment Recommendations None recommended by PT  ?Recommendations for Other Services ?    ?  ?Functional Status Assessment Patient has had a recent decline in their functional status and demonstrates the ability to make significant improvements in function in a reasonable and predictable amount of time.  ? ?  ?Precautions / Restrictions Precautions ?Precautions: Fall ?Precaution Comments: Stress incontinence - would benefit from donning a brief prior to mobility. ?Restrictions ?Weight Bearing Restrictions: Yes ?LLE Weight Bearing: Weight bearing as tolerated ?Other Position/Activity Restrictions: no new WB orders for R pelvic fx.  ? ?  ? ?Mobility ? Bed Mobility ?Overal bed mobility: Needs Assistance ?Bed Mobility: Supine to Sit, Sit to Supine, Rolling ?Rolling: Total assist, +2 for physical assistance ?  ?Supine to sit: HOB elevated, +2 for physical assistance, Max assist ?Sit to supine: Max assist, +2 for physical assistance ?  ?General bed mobility comments: Unable to move either LE to assist with mobility due to pain ?  ? ?Transfers ?  ?  ?  ?  ?  ?  ?  ?  ?  ?General transfer comment: not attempted due to severe pain in sitting up on side of bed. No new WB orders. ?  ? ?Ambulation/Gait ?  ?  ?  ?  ?  ?  ?  ?General Gait Details: unable ? ?Stairs ?  ?  ?  ?  ?  ? ?Wheelchair Mobility ?  ? ?Modified Rankin (Stroke Patients Only) ?  ? ?  ? ?Balance Overall balance assessment: Needs assistance ?Sitting-balance support: Feet supported ?  Sitting balance-Leahy Scale: Fair ?Sitting balance - Comments: sits unsupported EOB without LOB, close supervision ?  ?  ?  ?  ?  ?  ?  ?  ?  ?  ?  ?  ?  ?  ?  ?   ? ? ? ?Pertinent Vitals/Pain Pain Assessment ?Pain Assessment: Faces ?Faces Pain Scale: Hurts whole lot ?Breathing: occasional labored breathing, short period of  hyperventilation ?Negative Vocalization: occasional moan/groan, low speech, negative/disapproving quality ?Facial Expression: facial grimacing ?Body Language: tense, distressed pacing, fidgeting ?Consolability: distracted or reassured by voice/touch ?PAINAD Score: 6 ?Facial Expression: Grimacing ?Body Movements: Absence of movements ?Muscle Tension: Tense, rigid ?Compliance with ventilator (intubated pts.): N/A ?Vocalization (extubated pts.): Sighing, moaning ?CPOT Total: 4 ?Pain Location: B LE with movement ?Pain Descriptors / Indicators: Grimacing, Sore, Discomfort, Moaning ?Pain Intervention(s): Monitored during session, Repositioned, Premedicated before session, Limited activity within patient's tolerance  ? ? ?Home Living Family/patient expects to be discharged to:: Skilled nursing facility ?Living Arrangements: Children ?Available Help at Discharge: Family ?Type of Home: House ?Home Access: Stairs to enter ?Entrance Stairs-Rails: None ?Entrance Stairs-Number of Steps: 1 ?  ?Home Layout: Able to live on main level with bedroom/bathroom ?Home Equipment: Rolling Walker (2 wheels);BSC/3in1;Wheelchair - manual ?Additional Comments: daughter bathes pt in bed/on toilet  ?  ?Prior Function Prior Level of Function : Needs assist ?  ?  ?  ?  ?  ?  ?Mobility Comments: prior to fall which led to femur fracture, she was ambulating with RW and assist ?ADLs Comments: daughter was assisting with bathing/dressing ?  ? ? ?Hand Dominance  ? Dominant Hand: Right ? ?  ?Extremity/Trunk Assessment  ? Upper Extremity Assessment ?Upper Extremity Assessment: Generalized weakness ?  ? ?Lower Extremity Assessment ?Lower Extremity Assessment: RLE deficits/detail;LLE deficits/detail ?RLE: Unable to fully assess due to pain ?RLE Coordination: decreased gross motor ?LLE: Unable to fully assess due to pain ?LLE Coordination: decreased gross motor ?  ? ?Cervical / Trunk Assessment ?Cervical / Trunk Assessment: Kyphotic  ?Communication  ?  Communication: No difficulties  ?Cognition Arousal/Alertness: Awake/alert ?Behavior During Therapy: Anxious ?Overall Cognitive Status: History of cognitive impairments - at baseline ?  ?  ?  ?  ?  ?  ?  ?  ?  ?  ?  ?  ?  ?  ?  ?  ?General Comments: history of advanced dementia Alert, oriented to place, self ?  ?  ? ?  ?General Comments   ? ?  ?Exercises Total Joint Exercises ?Ankle Circles/Pumps: AROM, Both, 5 reps  ? ?Assessment/Plan  ?  ?PT Assessment Patient needs continued PT services  ?PT Problem List Decreased strength;Decreased mobility;Decreased safety awareness;Decreased activity tolerance;Decreased balance;Decreased knowledge of use of DME;Pain;Decreased cognition;Decreased skin integrity;Decreased range of motion ? ?   ?  ?PT Treatment Interventions DME instruction;Therapeutic activities;Gait training;Patient/family education;Therapeutic exercise;Balance training;Functional mobility training;Neuromuscular re-education   ? ?PT Goals (Current goals can be found in the Care Plan section)  ?Acute Rehab PT Goals ?Patient Stated Goal: None stated ?PT Goal Formulation: With patient ?Time For Goal Achievement: 04/15/21 ?Potential to Achieve Goals: Fair ? ?  ?Frequency Min 3X/week ?  ? ? ?Co-evaluation PT/OT/SLP Co-Evaluation/Treatment: Yes ?Reason for Co-Treatment: For patient/therapist safety;To address functional/ADL transfers;Necessary to address cognition/behavior during functional activity ?PT goals addressed during session: Mobility/safety with mobility;Balance ?  ?  ? ? ?  ?AM-PAC PT "6 Clicks" Mobility  ?Outcome Measure Help needed turning from your back to your side while in a flat bed  without using bedrails?: Total ?Help needed moving from lying on your back to sitting on the side of a flat bed without using bedrails?: Total ?Help needed moving to and from a bed to a chair (including a wheelchair)?: Total ?Help needed standing up from a chair using your arms (e.g., wheelchair or bedside chair)?:  Total ?Help needed to walk in hospital room?: Total ?Help needed climbing 3-5 steps with a railing? : Total ?6 Click Score: 6 ? ?  ?End of Session Equipment Utilized During Treatment: Oxygen ?Activity Tolerance: Patient

## 2021-04-02 ENCOUNTER — Inpatient Hospital Stay (HOSPITAL_COMMUNITY): Payer: Medicare HMO

## 2021-04-02 DIAGNOSIS — R338 Other retention of urine: Secondary | ICD-10-CM | POA: Diagnosis not present

## 2021-04-02 DIAGNOSIS — Z7189 Other specified counseling: Secondary | ICD-10-CM

## 2021-04-02 DIAGNOSIS — I629 Nontraumatic intracranial hemorrhage, unspecified: Secondary | ICD-10-CM | POA: Diagnosis not present

## 2021-04-02 LAB — BASIC METABOLIC PANEL
Anion gap: 11 (ref 5–15)
BUN: 28 mg/dL — ABNORMAL HIGH (ref 8–23)
CO2: 23 mmol/L (ref 22–32)
Calcium: 7.9 mg/dL — ABNORMAL LOW (ref 8.9–10.3)
Chloride: 111 mmol/L (ref 98–111)
Creatinine, Ser: 1.32 mg/dL — ABNORMAL HIGH (ref 0.44–1.00)
GFR, Estimated: 39 mL/min — ABNORMAL LOW (ref >60)
Glucose, Bld: 95 mg/dL (ref 70–99)
Potassium: 3.7 mmol/L (ref 3.5–5.1)
Sodium: 145 mmol/L (ref 135–145)

## 2021-04-02 LAB — CBC
HCT: 24.4 % — ABNORMAL LOW (ref 36.0–46.0)
Hemoglobin: 7.6 g/dL — ABNORMAL LOW (ref 12.0–15.0)
MCH: 33.2 pg (ref 26.0–34.0)
MCHC: 31.1 g/dL (ref 30.0–36.0)
MCV: 106.6 fL — ABNORMAL HIGH (ref 80.0–100.0)
Platelets: 312 10*3/uL (ref 150–400)
RBC: 2.29 MIL/uL — ABNORMAL LOW (ref 3.87–5.11)
RDW: 14.3 % (ref 11.5–15.5)
WBC: 13.3 10*3/uL — ABNORMAL HIGH (ref 4.0–10.5)
nRBC: 0.5 % — ABNORMAL HIGH (ref 0.0–0.2)

## 2021-04-02 LAB — LACTIC ACID, PLASMA: Lactic Acid, Venous: 0.7 mmol/L (ref 0.5–1.9)

## 2021-04-02 LAB — PROCALCITONIN: Procalcitonin: 0.79 ng/mL

## 2021-04-02 MED ORDER — BETHANECHOL CHLORIDE 10 MG PO TABS
10.0000 mg | ORAL_TABLET | Freq: Three times a day (TID) | ORAL | Status: DC
Start: 2021-04-02 — End: 2021-04-03
  Administered 2021-04-02 – 2021-04-03 (×4): 10 mg via ORAL
  Filled 2021-04-02 (×4): qty 1

## 2021-04-02 MED ORDER — CHLORHEXIDINE GLUCONATE CLOTH 2 % EX PADS
6.0000 | MEDICATED_PAD | Freq: Every day | CUTANEOUS | Status: DC
  Administered 2021-04-03: 6 via TOPICAL

## 2021-04-02 NOTE — TOC Progression Note (Signed)
Transition of Care (TOC) - Progression Note  ? ? ?Patient Details  ?Name: Julie Martinez ?MRN: [id number] ?Date of Birth: 1931-02-13 ? ?Transition of Care (TOC) CM/SW Contact  ?Geralynn Ochs, LCSW ?Phone Number: ?04/02/2021, 1:13 PM ? ?Clinical Narrative:   CSW spoke with daughter, [name], this morning, and she would like to take the patient home with hospice. CSW clarified with [name] that she wanted hospice and not home health, and she would like hospice to be set up. CSW discussed hospice options, and [name] chose [facility]. Patient will need hospital bed and tray table, but [name] needs time to clear space in her home for the DME to be delivered. CSW updated MD, and sent referral to [facility]. Pending oxygen evaluation for if patient needs home oxygen, as well. CSW to follow. ? ? ? ?Expected Discharge Plan: [place] ?Barriers to Discharge: Continued Medical Work up, Ship broker ? ?Expected Discharge Plan and Services ?Expected Discharge Plan: [place] ?  ?  ?Post Acute Care Choice: [place] ?Living arrangements for the past 2 months: [place] ?                ?  ?  ?  ?  ?  ?  ?  ?  ?  ?  ? ? ?Social Determinants of Health (SDOH) Interventions ?  ? ?Readmission Risk Interventions ?No flowsheet data found. ? ?

## 2021-04-02 NOTE — Progress Notes (Signed)
?PROGRESS NOTE ? ? ? ?ORTHA METTS  KDT:[id number] DOB: 10-May-1931 DOA: 03/31/2021 ?PCP: Cari Caraway, MD  ? ? ?Brief Narrative:  ?DVTCarolyn A Pfost is an 88 year old female with past medical history significant for essential pretension, CKD stage IIIb, DVT/PE on Eliquis, advanced dementia who presented to [facility] ED from [facility] SNF after having a fall out of bed. History is mostly obtained from review of records as the patient has history of dementia.  Patient had she had just recently been hospitalized from 3/3-3/9 after having a fall at home found to have a closed left hip fracture s/p left hip hemiarthroplasty on 3/5.  Eliquis had been temporarily held for the surgery, and was restarted following the procedure. Patient was found by staff after having a fall out of bed this morning with hematoma to the left forehead, skin tear noted to left forearm, and complaints of left hip pain. ?  ?In the ED, patient was afebrile, pulse 90-115, respiration 22-32, blood pressures elevated up to 170/69, and O2 saturations noted as low as 88% on room air with improvement on 2 L of nasal cannula oxygen. Labs significant for WBC 19.5, hemoglobin 8.9, sodium 146, BUN 26, creatinine 1.37.  CT scan of the head noted minimal focal subarachnoid hemorrhage within the posterior quadrigeminal cistern.  CT scan of the chest abdomen and pelvis noted acute on chronic fracture of the right superior pubic rami, unchanged compression fractures of T8 along with L4, age-indeterminate L3 compression fracture, chronic appearing bilateral rib fractures, small left-sided pleural effusion, gallstone, right inguinal hernia containing fat with small pleural fluid, and coronary calcifications.  Neurosurgery was consulted and recommended repeating CT scan of the head in 6 hours and possible discharge home. Urinalysis noted trace leukocytes with moderate hemoglobin, ketones, and no significant bacteria seen.  TRH was called to admit, but initially  deferred admission until repeat CT scan obtained.  Repeat CT scan showed small subdural collection of the right cerebral convexity.  Neurosurgery evaluated and recommended holding Eliquis and rechecking repeat CT scan of the brain in a.m. TRH consulted for admission.  ? ? ?Assessment & Plan: ?  ?Assessment and Plan: ?* Intracranial hemorrhage ([place]) secondary to fall ?Patient presented after having a fall out of bed at skilled nursing facility.  Initial CT head with possible subarachnoid hemorrhage within the posterior quadrigeminal cistern.  Repeat imaging showed small subdural collection in the right cerebral convexity.  Neurosurgery had already been consulted and recommended holding Eliquis with repeat CT head 3/14 with stable appearance of the small focus of SAH and previously described intermediate attenuating right cerebral convexity SDH less conspicuous.  Reevaluated by neurosurgery 3/14, Dr. Arnoldo Morale; stable and now signed off.  Now discontinued Eliquis.  We will not continue on discharge per discussion with patient's daughter as transitioning to hospice care. ?--Fall precautions ?--Supportive care ? ?Acute respiratory failure with hypoxia ([place]) ?Patient was noted to have O2 saturations as low as 88% on room air on arrival.  Patient was placed on 2 liters of nasal cannula oxygen with improvement in O2 saturations.  BNP slightly elevated with some pitting lower from edema with patient was given IV furosemide.  CT chest with small left pleural effusion, no significant pulmonary edema or focal consolidation. ?--Home O2 screen for possible need of oxygen on discharge; currently on room air ? ? ?SIRS (systemic inflammatory response syndrome) (HCC) ?Upon admission into the emergency department patient was noted to be tachycardic and tachypneic meeting SIRS criteria.  Labs significant for WBC  19.5 and lactic acid of 2.7.  Urinalysis did not note significant signs of infection.  Imaging of the chest, abdomen pelvis  did not give any clear source of infection.  Patient had just recently had surgery on her left hip. Procalcitonin 1.38.  Vancomycin was discontinued due to negative MRSA PCR. ?--WBC 19.5>14.5>13.3 ?--Blood cultures x2: No growth less than 24 hours ?--Procalcitonin 1.38>0.79 ?--lactic acid 2.7>0.7 ?--Continue cefepime until planned discharge likely tomorrow under home hospice. ? ?Chronic kidney disease (CKD) stage G3b/A1, moderately decreased glomerular filtration rate (GFR) between 30-44 mL/min/1.73 square meter and albuminuria creatinine ratio less than 30 mg/g (HCC) ?Baseline creatinine 1.4-1.6.;  Stable. ?--Cr 1.37>1.30>1.44>1.32 ?--Avoid nephrotoxins, renal dose all medications ?--Will avoid any further lab draws as patient is going to transition to home hospice ? ?Essential hypertension ?On admission blood pressure was elevated up to 170/69. Home medication regimen includes amlodipine 5 mg daily. ?--Continue amlodipine 5 mg daily ?--Hydralazine '25mg'$  PO q8h PRN SBP >180 or DBP >110 ?--continue to monitor BP closely ? ?Dementia ([place]) ?Medication regimen includes donepezil 10 mg nightly and Namenda 10 mg twice daily ?--Delirium precautions ?--Get up during the day ?--Encourage a familiar face to remain present throughout the day ?--Keep blinds open and lights on during daylight hours ?--Minimize the use of opioids/benzodiazepines ? ? ?Personal history of DVT (deep vein thrombosis) ?Vascular duplex ultrasound bilateral lower extremities 03/22/2021 with no evidence of DVT in either lower extremity.  Eliquis now discontinued due to intraparenchymal hemorrhage and fall risk with plan to transition to hospice on discharge at home. ? ?Closed fracture of superior pubic ramus ([place]) ?Acute on chronic.  Patient was noted to have  superior ramus fracture on the right.  Orthopedics recommended weightbearing as tolerated. ?--Tramadol as needed for pain ?--Plan to discharge to home with hospice once equipment arrives ? 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  Repeat vascular duplex ultrasound lower extremities March 2023 with no DVT noted.  Eliquis now discontinued as patient will be transitioning to hospice on discharge. ? ? ?Fall ?--Continue fall precautions ? ?Acute urinary retention ?During hospitalization, patient developed recurrent episodes of urinary retention requiring multiple in and out catheterizations.  Foley catheter was placed on 04/01/2021.  Given her lack of mobility and chronic comorbidities, plan to maintain Foley catheter for comfort on discharge as she is transitioning to hospice care. ? ?Goals of care, counseling/discussion ?Patient with history of advanced dementia with recurrent falls now sustaining subarachnoid and subdural hemorrhage.  Patient is been declining for some time.  Discussion with patient's daughter, [name] regarding goals of care on 3/15.  Given her advanced age as well, daughter in agreement to focus more on comfort care rather than aggressive measures at this time; given likely would not benefit patient significantly based on her comorbidities.  Daughter in agreement for transitioning care to hospice on discharge at home.  Social work updated; and plan for discharge home once equipment arrives. ? ? ? ? ?DVT prophylaxis: SCDs Start: 03/31/21 1501 ? ?  Code Status: DNR ?Family Communication: No family present at bedside this morning; updated patient's daughter via telephone this morning. ? ?Disposition Plan:  ?Level of care: Med-Surg ?Status is: Inpatient ?Remains inpatient appropriate because: Social work consulting home hospice agencies, will need equipment delivered with plan to discharge likely tomorrow under home hospice. ? ? ?  ? ?Consultants:  ?Neurosurgery ? ?Procedures:  ?Foley catheter placement 3/14 ? ?  Antimicrobials:  ?Vancomycin 3/13 - 3/14 ?Cefepime 3/13>> ? ? ?Subjective: ?Patient seen  examined bedside, resting comfortably.  Pleasantly confused.  No family present.,  Updated daughter [name] via telephone this morning.  Given patient's continued overall decline, poor appetite, advanced age, fre

## 2021-04-02 NOTE — Assessment & Plan Note (Addendum)
Patient with history of advanced dementia with recurrent falls now sustaining subarachnoid and subdural hemorrhage.  Patient is been declining for some time.  Discussion with patient's daughter, [name] regarding goals of care on 3/15.  Given her advanced age as well, daughter in agreement to focus more on comfort care rather than aggressive measures at this time; given likely would not benefit patient significantly based on her comorbidities.  Daughter in agreement for transitioning care to hospice on discharge at home.   ?

## 2021-04-02 NOTE — TOC Initial Note (Signed)
Transition of Care (TOC) - Initial/Assessment Note  ? ? ?Patient Details  ?Name: Julie Martinez ?MRN: [id number] ?Date of Birth: 10/09/1931 ? ?Transition of Care [facility]) CM/SW Contact:    ?Geralynn Ochs, LCSW ?Phone Number: ?04/02/2021, 1:06 PM ? ?Clinical Narrative:       CSW spoke with patient's daughter, [name], to discuss return to SNF. [name] would prefer not to return to [place], asking about [facility] in [place]. [name] also open to other options as long as the patient will get good care. CSW faxed out referral, attempted to contact [facility] to ask them to review referral, left a voicemail.  ? ?UPDATE 4:35 PM: CSW received call from daughter asking about what was going on, and now she is thinking that she will take the patient home instead. Daughter says she just can't see sending the patient to a nursing home where she won't get the care that she needs. CSW to check in with daughter tomorrow about plans.          ? ? ?Expected Discharge Plan: [place] ?Barriers to Discharge: Continued Medical Work up, Ship broker ? ? ?Patient Goals and CMS Choice ?Patient states their goals for this hospitalization and ongoing recovery are:: patient unable to participate in goal setting, not oriented ?CMS Medicare.gov Compare Post Acute Care list provided to:: Patient Represenative (must comment) ?Choice offered to / list presented to : Adult Children ? ?Expected Discharge Plan and Services ?Expected Discharge Plan: [place] ?  ?  ?Post Acute Care Choice: [place] ?Living arrangements for the past 2 months: [place] ?                ?  ?  ?  ?  ?  ?  ?  ?  ?  ?  ? ?Prior Living Arrangements/Services ?Living arrangements for the past 2 months: [place] ?Lives with:: Relatives ?Patient language and need for interpreter reviewed:: No ?Do you feel safe going back to the place where you live?: Yes      ?Need for Family Participation in  Patient Care: Yes (Comment) ?Care giver support system in place?: Yes (comment) ?  ?Criminal Activity/Legal Involvement Pertinent to Current Situation/Hospitalization: No - Comment as needed ? ?Activities of Daily Living ?  ?  ? ?Permission Sought/Granted ?Permission sought to share information with : Facility Sport and exercise psychologist, Family Supports ?Permission granted to share information with : Yes, Verbal Permission Granted ? Share Information with NAME: [name] ? Permission granted to share info w AGENCY: SNF ? Permission granted to share info w Relationship: Daughter ?   ? ?Emotional Assessment ?  ?Attitude/Demeanor/Rapport: Unable to Assess ?Affect (typically observed): Unable to Assess ?Orientation: : Oriented to Self ?Alcohol / Substance Use: Not Applicable ?Psych Involvement: No (comment) ? ?Admission diagnosis:  Subarachnoid hemorrhage ([place]) [I60.9] ?Trauma [T14.90XA] ?ICH (intracerebral hemorrhage) ([place]) [I61.9] ?Patient Active Problem List  ? Diagnosis Date Noted  ? Goals of care, counseling/discussion 04/02/2021  ? Acute urinary retention 04/02/2021  ? ICH (intracerebral hemorrhage) ([place]) 04/01/2021  ? SIRS (systemic inflammatory response syndrome) ([place]) 03/31/2021  ? Subarachnoid hemorrhage ([place]) 03/31/2021  ? Closed fracture of superior pubic ramus ([place]) 03/31/2021  ? 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 Intracranial hemorrhage ([place]) secondary to fall 03/31/2021  ? Acute respiratory failure with hypoxia ([place]) 03/31/2021  ? Chronic anticoagulation 03/31/2021  ? Acute postoperative anemia due to expected blood loss 03/26/2021  ? Personal history  of DVT (deep vein thrombosis) 03/26/2021  ? Chronic kidney disease (CKD) stage G3b/A1, moderately decreased glomerular filtration rate (GFR) between 30-44 mL/min/1.73 square meter and albuminuria creatinine ratio less than 30 mg/g (HCC) 03/26/2021  ? Closed left hip fracture ([place]) 03/21/2021  ? Essential hypertension  01/31/2018  ? CAP (community acquired pneumonia) 01/30/2018  ? Pneumonia 01/30/2018  ? Dementia ([place]) 01/04/2017  ? Fall 01/04/2017  ? Gait abnormality 01/04/2017  ? Left lumbar radiculopathy 05/25/2016  ? Complaints of memory disturbance 01/28/2015  ? Depression 01/28/2015  ? ?PCP:  Cari Caraway, MD ?Pharmacy:   ?[id number]- [place], [place] - [id number]?Phone: [phone]: [phone]? ? ? ? ?Social Determinants of Health (SDOH) Interventions ?  ? ?Readmission Risk Interventions ?No flowsheet data found. ? ? ?

## 2021-04-02 NOTE — Progress Notes (Addendum)
Manufacturing engineer [facility]) Hospital Liaison Note ? ?Referral received for patient/family interest in home with hospice. ACC spoke with patient's daughter [name]. Interest confirmed.  ? ?Hospice elgibility confirmed.  ? ?DME in the home: wheelchair and walker ? ?DME needs: hospital bed and bedside table. ? ?[name] is the contact for DME delivery.  ? ?Plan is to discharge home tomorrow after DME delivery.  ? ?Please send comfort prescriptions/medications home with patient at discharge. Send signed DNR and paperwork with patient.  ? ?Please call with any questions or concerns. Thank you ? ?Roselee Nova, LCSW ?[facility] Hospital Liaison ?[phone] ?

## 2021-04-02 NOTE — Assessment & Plan Note (Signed)
During hospitalization, patient developed recurrent episodes of urinary retention requiring multiple in and out catheterizations.  Foley catheter was placed on 04/01/2021.  Given her lack of mobility and chronic comorbidities, plan to maintain Foley catheter for comfort on discharge as she is transitioning to hospice care. ?

## 2021-04-03 ENCOUNTER — Other Ambulatory Visit: Payer: Self-pay

## 2021-04-03 ENCOUNTER — Other Ambulatory Visit (HOSPITAL_COMMUNITY): Payer: Self-pay

## 2021-04-03 DIAGNOSIS — Z515 Encounter for palliative care: Secondary | ICD-10-CM

## 2021-04-03 DIAGNOSIS — I629 Nontraumatic intracranial hemorrhage, unspecified: Secondary | ICD-10-CM | POA: Diagnosis not present

## 2021-04-03 MED ORDER — OXYCODONE HCL 5 MG PO TABS
5.0000 mg | ORAL_TABLET | ORAL | 0 refills | Status: AC | PRN
  Filled 2021-04-03: qty 42, 7d supply, fill #0

## 2021-04-03 MED ORDER — DOCUSATE SODIUM 100 MG PO CAPS
100.0000 mg | ORAL_CAPSULE | Freq: Two times a day (BID) | ORAL | 2 refills | Status: AC
  Filled 2021-04-03: qty 60, 30d supply, fill #0

## 2021-04-03 MED ORDER — MORPHINE SULFATE 10 MG/5ML PO SOLN
5.0000 mg | ORAL | Status: DC | PRN
  Administered 2021-04-03 (×2): 5 mg via ORAL
  Filled 2021-04-03 (×2): qty 5
  Filled 2021-04-03: qty 4

## 2021-04-03 MED ORDER — LORAZEPAM 0.5 MG PO TABS
0.5000 mg | ORAL_TABLET | ORAL | 0 refills | Status: AC | PRN
  Filled 2021-04-03: qty 42, 12d supply, fill #0

## 2021-04-03 MED ORDER — POLYETHYLENE GLYCOL 3350 17 GM/SCOOP PO POWD
17.0000 g | Freq: Every day | ORAL | 0 refills | Status: AC | PRN
  Filled 2021-04-03: qty 238, 14d supply, fill #0

## 2021-04-03 MED ORDER — AMLODIPINE BESYLATE 5 MG PO TABS: 5.0000 mg | ORAL_TABLET | Freq: Every day | ORAL | 2 refills | Status: AC

## 2021-04-03 NOTE — Progress Notes (Signed)
Spooke with daughter [name] over the phone and discussed discharge instructions. Meds, diet and activity. All questions answered.  ?

## 2021-04-03 NOTE — Assessment & Plan Note (Signed)
Discontinue Eliquis as above.  Serial CT head scans remained stable.  Transitioning to home hospice. ?

## 2021-04-03 NOTE — Discharge Summary (Signed)
?Physician Discharge Summary  ?Julie Martinez XBJ:[id number] DOB: [birth year] DOA: 03/31/2021 ? ?PCP: Cari Caraway, MD ? ?Admit date: 03/31/2021 ?Discharge date: 04/03/2021 ? ?Admitted From: [facility] ?Disposition: Home with hospice ? ?Recommendations for Outpatient Follow-up:  ?Follow up with PCP/home hospice provider following discharge ?Discharged on oxycodone, Ativan as needed for pain/anxiety ?Discontinued Eliquis for intraparenchymal hemorrhage ? ?Home Health: Hospice services ?Equipment/Devices: Hospital bed, bedside table, Foley catheter ? ?Discharge Condition: Guarded with overall poor prognosis ?CODE STATUS: DNR ?Diet recommendation: Comfort feeds as tolerates ? ?History of present illness: ? ?DVTCarolyn A Martinez is an 88 year old female with past medical history significant for essential pretension, CKD stage IIIb, DVT/PE on Eliquis, advanced dementia who presented to [facility] ED from [facility] after having a fall out of bed. History is mostly obtained from review of records as the patient has history of dementia.  Patient had she had just recently been hospitalized from 3/3-3/9 after having a fall at home found to have a closed left hip fracture s/p left hip hemiarthroplasty on 3/5.  Eliquis had been temporarily held for the surgery, and was restarted following the procedure. Patient was found by staff after having a fall out of bed this morning with hematoma to the left forehead, skin tear noted to left forearm, and complaints of left hip pain. ?  ?In the ED, patient was afebrile, pulse 90-115, respiration 22-32, blood pressures elevated up to 170/69, and O2 saturations noted as low as 88% on room air with improvement on 2 L of nasal cannula oxygen. Labs significant for WBC 19.5, hemoglobin 8.9, sodium 146, BUN 26, creatinine 1.37.  CT scan of the head noted minimal focal subarachnoid hemorrhage within the posterior quadrigeminal cistern.  CT scan of the chest abdomen and pelvis noted acute on chronic  fracture of the right superior pubic rami, unchanged compression fractures of T8 along with L4, age-indeterminate L3 compression fracture, chronic appearing bilateral rib fractures, small left-sided pleural effusion, gallstone, right inguinal hernia containing fat with small pleural fluid, and coronary calcifications.  Neurosurgery was consulted and recommended repeating CT scan of the head in 6 hours and possible discharge home. Urinalysis noted trace leukocytes with moderate hemoglobin, ketones, and no significant bacteria seen.  [facility] was called to admit, but initially deferred admission until repeat CT scan obtained.  Repeat CT scan showed small subdural collection of the right cerebral convexity.  Neurosurgery evaluated and recommended holding Eliquis and rechecking repeat CT scan of the brain in a.m. [facility] consulted for admission. ? ?Hospital course: ? ?Assessment and Plan: ?* Intracranial hemorrhage ([place]) secondary to fall ?Patient presented after having a fall out of bed at skilled nursing facility.  Initial CT head with possible subarachnoid hemorrhage within the posterior quadrigeminal cistern.  Repeat imaging showed small subdural collection in the right cerebral convexity.  Neurosurgery had already been consulted and recommended holding Eliquis with repeat CT head 3/14 with stable appearance of the small focus of SAH and previously described intermediate attenuating right cerebral convexity SDH less conspicuous.  Reevaluated by neurosurgery 3/14, Dr. Arnoldo Morale; stable and now signed off.  Now discontinued Eliquis.  We will not continue on discharge per discussion with patient's daughter as transitioning to hospice care. ? ?Acute respiratory failure with hypoxia ([place]) ?Patient was noted to have O2 saturations as low as 88% on room air on arrival.  Patient was placed on 2 liters of nasal cannula oxygen with improvement in O2 saturations.  BNP slightly elevated with some pitting lower from edema with patient  was  given IV furosemide.  CT chest with small left pleural effusion, no significant pulmonary edema or focal consolidation.  Oxygen was weaned off during hospitalization. ? ? ?SIRS (systemic inflammatory response syndrome) (HCC) ?Upon admission into the emergency department patient was noted to be tachycardic and tachypneic meeting SIRS criteria.  Labs significant for WBC 19.5 and lactic acid of 2.7.  Urinalysis did not note significant signs of infection.  Imaging of the chest, abdomen pelvis did not give any clear source of infection.  Patient had just recently had surgery on her left hip. Procalcitonin 1.38.  Vancomycin was discontinued due to negative MRSA PCR.  Blood culture showed no growth during hospitalization.  No focal evidence of infection was found.  Patient received 3 days of IV cefepime while inpatient.  Now transitioning to hospice ? ?Chronic kidney disease (CKD) stage G3b/A1, moderately decreased glomerular filtration rate (GFR) between 30-44 mL/min/1.73 square meter and albuminuria creatinine ratio less than 30 mg/g (HCC) ?Baseline creatinine 1.4-1.6.;  Stable.  Creatinine 1.32 at time of discharge. ? ?Essential hypertension ?Amlodipine 5 mg p.o. daily. ? ?Dementia ([place]) ?Medication regimen includes donepezil 10 mg nightly and Namenda 10 mg twice daily ?--Delirium precautions ?--Get up during the day ?--Encourage a familiar face to remain present throughout the day ?--Keep blinds open and lights on during daylight hours ?--Minimize the use of opioids/benzodiazepines ? ? ?Personal history of DVT (deep vein thrombosis) ?Vascular duplex ultrasound bilateral lower extremities 03/22/2021 with no evidence of DVT in either lower extremity.  Eliquis now discontinued due to intraparenchymal hemorrhage and fall risk with plan to transition to hospice on discharge at home. ? ?Closed fracture of superior pubic ramus ([place]) ?Acute on chronic.  Patient was noted to have  superior ramus fracture on the right.   Orthopedics recommended weightbearing as tolerated.  Oxycodone as needed for pain control. ? 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  Repeat vascular duplex ultrasound lower extremities March 2023 with no DVT noted.  Eliquis now discontinued as patient will be transitioning to hospice on discharge. ? ? ?Fall ?Fall precautions ? ?Acute urinary retention ?During hospitalization, patient developed recurrent episodes of urinary retention requiring multiple in and out catheterizations.  Foley catheter was placed on 04/01/2021.  Given her lack of mobility and chronic comorbidities, plan to maintain Foley catheter for comfort on discharge as she is transitioning to hospice care. ? ?Goals of care, counseling/discussion ?Patient with history of advanced dementia with recurrent falls now sustaining subarachnoid and subdural hemorrhage.  Patient is been declining for some time.  Discussion with patient's daughter, [name] regarding goals of care on 3/15.  Given her advanced age as well, daughter in agreement to focus more on comfort care rather than aggressive measures at this time; given likely would not benefit patient significantly based on her comorbidities.  Daughter in agreement for transitioning care to hospice on discharge at home.   ? ?Subarachnoid hemorrhage ([place]) ?Discontinue Eliquis as above.  Serial CT head scans remained stable.  Transitioning to home hospice. ? ? ? ? ? ? ?Discharge Diagnoses:  ?Principal Problem: ?  Intracranial hemorrhage ([place]) secondary to fall ?Active Problems: ?  Acute respiratory failure with hypoxia ([place]) ?  SIRS (systemic inflammatory response syndrome) (HCC) ?  Chronic kidney disease (CKD) stage G3b/A1, moderately decreased glomerular filtration rate (GFR) between 30-44 mL/min/1.73 square meter and albuminuria creatinine ratio less than 30 mg/g (HCC) ?  Essential  hypertension ?  Dementia  ([place]) ?  Personal history of DVT (deep vein thrombosis) ?  Closed fracture of superior pubic ramus ([place]) ?  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  Fall ?  Subarachnoid hemorrhage ([place]

## 2021-04-03 NOTE — TOC Transition Note (Signed)
Transition of Care (TOC) - CM/SW Discharge Note ? ? ?Patient Details  ?Name: Julie Martinez ?MRN: [id number] ?Date of Birth: 11/19/1931 ? ?Transition of Care [facility]) CM/SW Contact:  ?Geralynn Ochs, LCSW ?Phone Number: ?04/03/2021, 2:02 PM ? ? ?Clinical Narrative:   CSW spoke with daughter, [name], to confirm that hospital bed has been delivered and set up. Confirmed with hospice plan to go home today, hospice scheduled visit at 5 pm today. Transport scheduled with [facility] for next available. No other TOC needs at this time. ? ? ? ?Final next level of care: [place] ?Barriers to Discharge: Barriers Resolved ? ? ?Patient Goals and CMS Choice ?Patient states their goals for this hospitalization and ongoing recovery are:: patient unable to participate in goal setting, not oriented ?CMS Medicare.gov Compare Post Acute Care list provided to:: Patient Represenative (must comment) ?Choice offered to / list presented to : Adult Children ? ?Discharge Placement ?  ?           ?  ?Patient to be transferred to facility by: [facility] ?Name of family member notified: [name] ?Patient and family notified of of transfer: 04/03/21 ? ?Discharge Plan and Services ?  ?  ?Post Acute Care Choice: [place]          ?  ?  ?  ?  ?  ?  ?  ?  ?  ?  ? ?Social Determinants of Health (SDOH) Interventions ?  ? ? ?Readmission Risk Interventions ?No flowsheet data found. ? ? ? ? ?

## 2021-04-05 ENCOUNTER — Other Ambulatory Visit (HOSPITAL_COMMUNITY): Payer: Self-pay

## 2021-04-05 LAB — CULTURE, BLOOD (ROUTINE X 2)
Culture: NO GROWTH
Culture: NO GROWTH
Special Requests: ADEQUATE
Special Requests: ADEQUATE

## 2021-04-19 DEATH — deceased

## 2021-05-15 ENCOUNTER — Other Ambulatory Visit (HOSPITAL_COMMUNITY): Payer: Self-pay

## 2021-06-09 ENCOUNTER — Telehealth: Payer: Self-pay | Admitting: Family Medicine

## 2021-06-09 NOTE — Telephone Encounter (Signed)
Sympathy card placed in mail to daughter, [name].

## 2021-06-09 NOTE — Telephone Encounter (Signed)
FYI: called to confirm pt's 5/24 appt and was informed that pt has passed away.

## 2021-06-11 ENCOUNTER — Ambulatory Visit: Payer: Self-pay | Admitting: Family Medicine

## 2021-07-23 ENCOUNTER — Other Ambulatory Visit (HOSPITAL_COMMUNITY): Payer: Self-pay
